# Patient Record
Sex: Male | Born: 1955
Health system: Southern US, Community
[De-identification: ages and names within clinical notes are randomized; demographics above are authoritative.]

## PROBLEM LIST (undated history)

## (undated) DIAGNOSIS — I351 Nonrheumatic aortic (valve) insufficiency: Secondary | ICD-10-CM

## (undated) DIAGNOSIS — Z87442 Personal history of urinary calculi: Secondary | ICD-10-CM

## (undated) DIAGNOSIS — E785 Hyperlipidemia, unspecified: Secondary | ICD-10-CM

## (undated) DIAGNOSIS — I2510 Atherosclerotic heart disease of native coronary artery without angina pectoris: Secondary | ICD-10-CM

## (undated) DIAGNOSIS — I10 Essential (primary) hypertension: Secondary | ICD-10-CM

## (undated) DIAGNOSIS — R9389 Abnormal findings on diagnostic imaging of other specified body structures: Secondary | ICD-10-CM

## (undated) DIAGNOSIS — IMO0001 Reserved for inherently not codable concepts without codable children: Secondary | ICD-10-CM

## (undated) DIAGNOSIS — R002 Palpitations: Secondary | ICD-10-CM

## (undated) DIAGNOSIS — I5022 Chronic systolic (congestive) heart failure: Secondary | ICD-10-CM

## (undated) DIAGNOSIS — C8590 Non-Hodgkin lymphoma, unspecified, unspecified site: Secondary | ICD-10-CM

## (undated) DIAGNOSIS — J45909 Unspecified asthma, uncomplicated: Secondary | ICD-10-CM

## (undated) DIAGNOSIS — J189 Pneumonia, unspecified organism: Secondary | ICD-10-CM

## (undated) HISTORY — DX: Unspecified asthma, uncomplicated: J45.909

---

## 1998-11-17 ENCOUNTER — Emergency Department (HOSPITAL_COMMUNITY): Admission: EM | Admit: 1998-11-17 | Discharge: 1998-11-17 | Payer: Self-pay | Admitting: Emergency Medicine

## 1998-11-17 ENCOUNTER — Encounter: Payer: Self-pay | Admitting: Emergency Medicine

## 1998-11-18 ENCOUNTER — Encounter: Admission: RE | Admit: 1998-11-18 | Discharge: 1998-11-18 | Payer: Self-pay | Admitting: *Deleted

## 2001-06-14 DIAGNOSIS — C8590 Non-Hodgkin lymphoma, unspecified, unspecified site: Secondary | ICD-10-CM

## 2001-06-14 DIAGNOSIS — J189 Pneumonia, unspecified organism: Secondary | ICD-10-CM

## 2001-06-14 HISTORY — DX: Pneumonia, unspecified organism: J18.9

## 2001-06-14 HISTORY — DX: Non-Hodgkin lymphoma, unspecified, unspecified site: C85.90

## 2002-02-01 ENCOUNTER — Ambulatory Visit (HOSPITAL_COMMUNITY): Admission: RE | Admit: 2002-02-01 | Discharge: 2002-02-01 | Payer: Self-pay | Admitting: Oncology

## 2002-02-01 ENCOUNTER — Encounter (HOSPITAL_COMMUNITY): Payer: Self-pay | Admitting: Oncology

## 2002-02-06 ENCOUNTER — Ambulatory Visit (HOSPITAL_COMMUNITY): Admission: RE | Admit: 2002-02-06 | Discharge: 2002-02-06 | Payer: Self-pay | Admitting: Oncology

## 2002-05-09 ENCOUNTER — Encounter (HOSPITAL_COMMUNITY): Payer: Self-pay | Admitting: Oncology

## 2002-05-09 ENCOUNTER — Ambulatory Visit (HOSPITAL_COMMUNITY): Admission: RE | Admit: 2002-05-09 | Discharge: 2002-05-09 | Payer: Self-pay | Admitting: Oncology

## 2002-05-15 ENCOUNTER — Inpatient Hospital Stay (HOSPITAL_COMMUNITY): Admission: EM | Admit: 2002-05-15 | Discharge: 2002-05-26 | Payer: Self-pay | Admitting: Emergency Medicine

## 2002-05-15 ENCOUNTER — Encounter: Payer: Self-pay | Admitting: Emergency Medicine

## 2002-05-16 ENCOUNTER — Encounter: Payer: Self-pay | Admitting: Cardiovascular Disease

## 2002-05-19 ENCOUNTER — Encounter: Payer: Self-pay | Admitting: Hematology and Oncology

## 2002-05-22 ENCOUNTER — Encounter: Payer: Self-pay | Admitting: Cardiovascular Disease

## 2002-05-25 ENCOUNTER — Encounter: Payer: Self-pay | Admitting: Oncology

## 2002-05-30 ENCOUNTER — Encounter (HOSPITAL_COMMUNITY): Payer: Self-pay | Admitting: Oncology

## 2002-05-30 ENCOUNTER — Ambulatory Visit (HOSPITAL_COMMUNITY): Admission: RE | Admit: 2002-05-30 | Discharge: 2002-05-30 | Payer: Self-pay | Admitting: Oncology

## 2002-06-01 ENCOUNTER — Ambulatory Visit: Admission: RE | Admit: 2002-06-01 | Discharge: 2002-08-08 | Payer: Self-pay | Admitting: Radiation Oncology

## 2002-11-01 ENCOUNTER — Ambulatory Visit (HOSPITAL_COMMUNITY): Admission: RE | Admit: 2002-11-01 | Discharge: 2002-11-01 | Payer: Self-pay | Admitting: Oncology

## 2002-11-01 ENCOUNTER — Encounter (HOSPITAL_COMMUNITY): Payer: Self-pay | Admitting: Oncology

## 2003-10-10 ENCOUNTER — Ambulatory Visit (HOSPITAL_COMMUNITY): Admission: RE | Admit: 2003-10-10 | Discharge: 2003-10-10 | Payer: Self-pay | Admitting: Oncology

## 2004-07-27 ENCOUNTER — Ambulatory Visit: Payer: Self-pay | Admitting: Oncology

## 2004-11-16 ENCOUNTER — Ambulatory Visit: Payer: Self-pay | Admitting: Oncology

## 2004-11-17 ENCOUNTER — Ambulatory Visit (HOSPITAL_COMMUNITY): Admission: RE | Admit: 2004-11-17 | Discharge: 2004-11-17 | Payer: Self-pay | Admitting: Oncology

## 2005-05-21 ENCOUNTER — Ambulatory Visit: Payer: Self-pay | Admitting: Oncology

## 2005-08-25 ENCOUNTER — Ambulatory Visit: Payer: Self-pay | Admitting: Cardiology

## 2005-08-26 ENCOUNTER — Ambulatory Visit: Payer: Self-pay | Admitting: Internal Medicine

## 2005-09-28 ENCOUNTER — Ambulatory Visit: Payer: Self-pay | Admitting: Cardiology

## 2005-10-07 ENCOUNTER — Ambulatory Visit: Payer: Self-pay | Admitting: Internal Medicine

## 2005-10-18 ENCOUNTER — Ambulatory Visit: Payer: Self-pay

## 2005-11-18 ENCOUNTER — Ambulatory Visit: Payer: Self-pay | Admitting: Oncology

## 2005-11-22 ENCOUNTER — Ambulatory Visit (HOSPITAL_COMMUNITY): Admission: RE | Admit: 2005-11-22 | Discharge: 2005-11-22 | Payer: Self-pay | Admitting: Oncology

## 2005-11-22 LAB — CBC WITH DIFFERENTIAL/PLATELET
Basophils Absolute: 0.1 10*3/uL (ref 0.0–0.1)
EOS%: 3 % (ref 0.0–7.0)
Eosinophils Absolute: 0.4 10*3/uL (ref 0.0–0.5)
LYMPH%: 15.6 % (ref 14.0–48.0)
MCH: 31.3 pg (ref 28.0–33.4)
MCV: 89.2 fL (ref 81.6–98.0)
MONO%: 9.3 % (ref 0.0–13.0)
NEUT#: 9.9 10*3/uL — ABNORMAL HIGH (ref 1.5–6.5)
Platelets: 323 10*3/uL (ref 145–400)
RBC: 4.85 10*6/uL (ref 4.20–5.71)
RDW: 13.3 % (ref 11.2–14.6)

## 2005-11-22 LAB — COMPREHENSIVE METABOLIC PANEL
AST: 17 U/L (ref 0–37)
Alkaline Phosphatase: 73 U/L (ref 39–117)
BUN: 23 mg/dL (ref 6–23)
Glucose, Bld: 101 mg/dL — ABNORMAL HIGH (ref 70–99)
Total Bilirubin: 0.5 mg/dL (ref 0.3–1.2)

## 2006-05-19 ENCOUNTER — Ambulatory Visit: Payer: Self-pay | Admitting: Oncology

## 2012-08-11 ENCOUNTER — Ambulatory Visit (INDEPENDENT_AMBULATORY_CARE_PROVIDER_SITE_OTHER): Payer: BC Managed Care – PPO | Admitting: Physician Assistant

## 2012-08-11 VITALS — BP 150/70 | HR 88 | Temp 97.8°F | Resp 20 | Ht 66.0 in | Wt 247.0 lb

## 2012-08-11 DIAGNOSIS — R059 Cough, unspecified: Secondary | ICD-10-CM

## 2012-08-11 DIAGNOSIS — R0981 Nasal congestion: Secondary | ICD-10-CM

## 2012-08-11 DIAGNOSIS — R05 Cough: Secondary | ICD-10-CM

## 2012-08-11 DIAGNOSIS — J3489 Other specified disorders of nose and nasal sinuses: Secondary | ICD-10-CM

## 2012-08-11 MED ORDER — IPRATROPIUM BROMIDE 0.03 % NA SOLN: 2.0000 | Freq: Two times a day (BID) | NASAL | Status: DC

## 2012-08-11 MED ORDER — AZITHROMYCIN 250 MG PO TABS: ORAL_TABLET | ORAL | Status: DC

## 2012-08-11 NOTE — Progress Notes (Signed)
  Subjective:    Patient ID: Donald Escobar., male    DOB: [birth year], 57 y.o.   MRN: [id number]  HPI 57 year old male presents with 3 day history of nasal congestion, sore throat, dry, hacking cough, and sinus pressure.  Does admit to chills and subjective fever - no documented fever.  Denies nausea, vomiting, headache, dizziness, hemoptysis, or otalgia.  He has been taking ibuprofen with sudafed which has helped slightly but also can have contributed to his elevated BP today. Does have history of hypertension treated with norvasc and HTCZ.  Treated by a NP at his work and has been well controlled recently.      Review of Systems  Constitutional: Positive for chills. Negative for fever.  HENT: Positive for congestion, sore throat, rhinorrhea and postnasal drip. Negative for ear pain and neck pain.   Respiratory: Positive for cough. Negative for chest tightness, shortness of breath and wheezing.   Gastrointestinal: Negative for nausea, vomiting and abdominal pain.  Neurological: Negative for dizziness, light-headedness and headaches.  All other systems reviewed and are negative.       Objective:   Physical Exam  Constitutional: He is oriented to person, place, and time. He appears well-developed and well-nourished.  HENT:  Head: Normocephalic and atraumatic.  Right Ear: Hearing, tympanic membrane, external ear and ear canal normal.  Left Ear: Hearing, tympanic membrane, external ear and ear canal normal.  Mouth/Throat: Uvula is midline, oropharynx is clear and moist and mucous membranes are normal. No oropharyngeal exudate.  Eyes: Conjunctivae are normal.  Neck: Normal range of motion.  Cardiovascular: Normal rate, regular rhythm and normal heart sounds.   Pulmonary/Chest: Effort normal and breath sounds normal.  Neurological: He is alert and oriented to person, place, and time.  Psychiatric: He has a normal mood and affect. His behavior is normal. Judgment and thought content normal.           Assessment & Plan:  1. Cough - Plan: azithromycin (ZITHROMAX) 250 MG tablet  -Will go ahead and treat with Zpack - pt requested  -D/C sudafed  -Mucinex as directed 2. Nasal congestion - Plan: ipratropium (ATROVENT) 0.03 % nasal spray  -Atrovent NS bid to help with congestion  -Follow up if symptoms worsen or fail to improve.

## 2013-06-15 ENCOUNTER — Ambulatory Visit (INDEPENDENT_AMBULATORY_CARE_PROVIDER_SITE_OTHER): Payer: BC Managed Care – PPO | Admitting: Internal Medicine

## 2013-06-15 VITALS — BP 140/72 | HR 76 | Temp 99.2°F | Resp 18 | Ht 66.0 in | Wt 245.0 lb

## 2013-06-15 DIAGNOSIS — R059 Cough, unspecified: Secondary | ICD-10-CM

## 2013-06-15 DIAGNOSIS — J019 Acute sinusitis, unspecified: Secondary | ICD-10-CM

## 2013-06-15 DIAGNOSIS — R05 Cough: Secondary | ICD-10-CM

## 2013-06-15 MED ORDER — AZITHROMYCIN 500 MG PO TABS: 500.0000 mg | ORAL_TABLET | Freq: Every day | ORAL | Status: DC

## 2013-06-15 MED ORDER — HYDROCODONE-ACETAMINOPHEN 7.5-325 MG/15ML PO SOLN: 10.0000 mL | Freq: Four times a day (QID) | ORAL | Status: DC | PRN

## 2013-06-15 NOTE — Patient Instructions (Signed)
Insomnia Insomnia is frequent trouble falling and/or staying asleep. Insomnia can be a long term problem or a short term problem. Both are common. Insomnia can be a short term problem when the wakefulness is related to a certain stress or worry. Long term insomnia is often related to ongoing stress during waking hours and/or poor sleeping habits. Overtime, sleep deprivation itself can make the problem worse. Every little thing feels more severe because you are overtired and your ability to cope is decreased. CAUSES   Stress, anxiety, and depression.  Poor sleeping habits.  Distractions such as TV in the bedroom.  Naps close to bedtime.  Engaging in emotionally charged conversations before bed.  Technical reading before sleep.  Alcohol and other sedatives. They may make the problem worse. They can hurt normal sleep patterns and normal dream activity.  Stimulants such as caffeine for several hours prior to bedtime.  Pain syndromes and shortness of breath can cause insomnia.  Exercise late at night.  Changing time zones may cause sleeping problems (jet lag). It is sometimes helpful to have someone observe your sleeping patterns. They should look for periods of not breathing during the night (sleep apnea). They should also look to see how long those periods last. If you live alone or observers are uncertain, you can also be observed at a sleep clinic where your sleep patterns will be professionally monitored. Sleep apnea requires a checkup and treatment. Give your caregivers your medical history. Give your caregivers observations your family has made about your sleep.  SYMPTOMS   Not feeling rested in the morning.  Anxiety and restlessness at bedtime.  Difficulty falling and staying asleep. TREATMENT   Your caregiver may prescribe treatment for an underlying medical disorders. Your caregiver can give advice or help if you are using alcohol or other drugs for self-medication. Treatment  of underlying problems will usually eliminate insomnia problems.  Medications can be prescribed for short time use. They are generally not recommended for lengthy use.  Over-the-counter sleep medicines are not recommended for lengthy use. They can be habit forming.  You can promote easier sleeping by making lifestyle changes such as:  Using relaxation techniques that help with breathing and reduce muscle tension.  Exercising earlier in the day.  Changing your diet and the time of your last meal. No night time snacks.  Establish a regular time to go to bed.  Counseling can help with stressful problems and worry.  Soothing music and white noise may be helpful if there are background noises you cannot remove.  Stop tedious detailed work at least one hour before bedtime. HOME CARE INSTRUCTIONS   Keep a diary. Inform your caregiver about your progress. This includes any medication side effects. See your caregiver regularly. Take note of:  Times when you are asleep.  Times when you are awake during the night.  The quality of your sleep.  How you feel the next day. This information will help your caregiver care for you.  Get out of bed if you are still awake after 15 minutes. Read or do some quiet activity. Keep the lights down. Wait until you feel sleepy and go back to bed.  Keep regular sleeping and waking hours. Avoid naps.  Exercise regularly.  Avoid distractions at bedtime. Distractions include watching television or engaging in any intense or detailed activity like attempting to balance the household checkbook.  Develop a bedtime ritual. Keep a familiar routine of bathing, brushing your teeth, climbing into bed at the same   time each night, listening to soothing music. Routines increase the success of falling to sleep faster.  Use relaxation techniques. This can be using breathing and muscle tension release routines. It can also include visualizing peaceful scenes. You can  also help control troubling or intruding thoughts by keeping your mind occupied with boring or repetitive thoughts like the old concept of counting sheep. You can make it more creative like imagining planting one beautiful flower after another in your backyard garden.  During your day, work to eliminate stress. When this is not possible use some of the previous suggestions to help reduce the anxiety that accompanies stressful situations. MAKE SURE YOU:   Understand these instructions.  Will watch your condition.  Will get help right away if you are not doing well or get worse. Document Released: 05/28/2000 Document Revised: 08/23/2011 Document Reviewed: 06/28/2007 [facility] Patient Information 2014 [place]. Sinusitis Sinusitis is redness, soreness, and swelling (inflammation) of the paranasal sinuses. Paranasal sinuses are air pockets within the bones of your face (beneath the eyes, the middle of the forehead, or above the eyes). In healthy paranasal sinuses, mucus is able to drain out, and air is able to circulate through them by way of your nose. However, when your paranasal sinuses are inflamed, mucus and air can become trapped. This can allow bacteria and other germs to grow and cause infection. Sinusitis can develop quickly and last only a short time (acute) or continue over a long period (chronic). Sinusitis that lasts for more than 12 weeks is considered chronic.  CAUSES  Causes of sinusitis include:  Allergies.  Structural abnormalities, such as displacement of the cartilage that separates your nostrils (deviated septum), which can decrease the air flow through your nose and sinuses and affect sinus drainage.  Functional abnormalities, such as when the small hairs (cilia) that line your sinuses and help remove mucus do not work properly or are not present. SYMPTOMS  Symptoms of acute and chronic sinusitis are the same. The primary symptoms are pain and pressure around the  affected sinuses. Other symptoms include:  Upper toothache.  Earache.  Headache.  Bad breath.  Decreased sense of smell and taste.  A cough, which worsens when you are lying flat.  Fatigue.  Fever.  Thick drainage from your nose, which often is green and may contain pus (purulent).  Swelling and warmth over the affected sinuses. DIAGNOSIS  Your caregiver will perform a physical exam. During the exam, your caregiver may:  Look in your nose for signs of abnormal growths in your nostrils (nasal polyps).  Tap over the affected sinus to check for signs of infection.  View the inside of your sinuses (endoscopy) with a special imaging device with a light attached (endoscope), which is inserted into your sinuses. If your caregiver suspects that you have chronic sinusitis, one or more of the following tests may be recommended:  Allergy tests.  Nasal culture A sample of mucus is taken from your nose and sent to a lab and screened for bacteria.  Nasal cytology A sample of mucus is taken from your nose and examined by your caregiver to determine if your sinusitis is related to an allergy. TREATMENT  Most cases of acute sinusitis are related to a viral infection and will resolve on their own within 10 days. Sometimes medicines are prescribed to help relieve symptoms (pain medicine, decongestants, nasal steroid sprays, or saline sprays).  However, for sinusitis related to a bacterial infection, your caregiver will prescribe antibiotic medicines.  These are medicines that will help kill the bacteria causing the infection.  Rarely, sinusitis is caused by a fungal infection. In theses cases, your caregiver will prescribe antifungal medicine. For some cases of chronic sinusitis, surgery is needed. Generally, these are cases in which sinusitis recurs more than 3 times per year, despite other treatments. HOME CARE INSTRUCTIONS   Drink plenty of water. Water helps thin the mucus so your sinuses  can drain more easily.  Use a humidifier.  Inhale steam 3 to 4 times a day (for example, sit in the bathroom with the shower running).  Apply a warm, moist washcloth to your face 3 to 4 times a day, or as directed by your caregiver.  Use saline nasal sprays to help moisten and clean your sinuses.  Take over-the-counter or prescription medicines for pain, discomfort, or fever only as directed by your caregiver. SEEK IMMEDIATE MEDICAL CARE IF:  You have increasing pain or severe headaches.  You have nausea, vomiting, or drowsiness.  You have swelling around your face.  You have vision problems.  You have a stiff neck.  You have difficulty breathing. MAKE SURE YOU:   Understand these instructions.  Will watch your condition.  Will get help right away if you are not doing well or get worse. Document Released: 05/31/2005 Document Revised: 08/23/2011 Document Reviewed: 06/15/2011 [facility] Patient Information 2014 [place], [place].

## 2013-06-15 NOTE — Progress Notes (Signed)
   Subjective:    Patient ID: Donald Ganja., male    DOB: [birth year], 58 y.o.   MRN: [id number]  HPI Congestio, HA, milky copious sinus discharge.Now over 10 days and coughing from PND. Low grade fever, rarely gets sick.   Review of Systems Heart murmur and palpatations.    Objective:   Physical Exam  Constitutional: He is oriented to person, place, and time. He appears well-developed and well-nourished.  HENT:  Head: Normocephalic.  Right Ear: External ear normal.  Left Ear: External ear normal.  Nose: Mucosal edema, rhinorrhea and sinus tenderness present. Right sinus exhibits no maxillary sinus tenderness and no frontal sinus tenderness. Left sinus exhibits no maxillary sinus tenderness and no frontal sinus tenderness.  Mouth/Throat: Oropharynx is clear and moist.  Cardiovascular: Normal rate, regular rhythm and normal heart sounds.   Pulmonary/Chest: Effort normal and breath sounds normal. He has no wheezes. He exhibits no tenderness.  Neurological: He is alert and oriented to person, place, and time. He exhibits normal muscle tone. Coordination normal.  Psychiatric: He has a normal mood and affect. His behavior is normal.          Assessment & Plan:  Evaluate heart murmur soon Zithromax 500mg Alvina Filbert

## 2013-06-21 ENCOUNTER — Ambulatory Visit: Payer: BC Managed Care – PPO

## 2013-06-21 ENCOUNTER — Ambulatory Visit (INDEPENDENT_AMBULATORY_CARE_PROVIDER_SITE_OTHER): Payer: BC Managed Care – PPO | Admitting: Family Medicine

## 2013-06-21 VITALS — BP 138/64 | HR 86 | Temp 97.8°F | Resp 16 | Ht 66.0 in | Wt 241.0 lb

## 2013-06-21 DIAGNOSIS — J3489 Other specified disorders of nose and nasal sinuses: Secondary | ICD-10-CM

## 2013-06-21 DIAGNOSIS — J320 Chronic maxillary sinusitis: Secondary | ICD-10-CM

## 2013-06-21 MED ORDER — FLUTICASONE PROPIONATE 50 MCG/ACT NA SUSP: NASAL | Status: DC

## 2013-06-21 MED ORDER — CEFDINIR 300 MG PO CAPS
600.0000 mg | ORAL_CAPSULE | Freq: Every day | ORAL | Status: DC
Start: 2013-06-21 — End: 2014-07-11

## 2013-06-21 MED ORDER — PREDNISONE 20 MG PO TABS: ORAL_TABLET | ORAL | Status: DC

## 2013-06-21 NOTE — Progress Notes (Signed)
Subjective: 58 year old man who was here last week with upper respiratory and sections symptoms and diagnosed with a possible sinusitis by Dr. Elder Cyphers. He was treated with azithromycin. He is not improved. He works in [employer] so he gets a moderate amount of dust from the fibers. He does not smoke. He has been feeling stuffy in his ears. He is blowing a lot of purulent stuff out of his nose. He does not complain much of sore throat. He coughs some, primarily when he shifts position such as laying down. He's not been feverish.  Objective: Pleasant alert gentleman in no major distress. He is congested. His TMs are normal. Nose congested. Throat clear except for little postnasal drainage. Neck supple without significant nodes. Chest clear to auscultation. Heart regular without murmurs.  Assessment: Persistent upper respiratory infection  Plan: Sinus x-rays  .[facility] reading (PRIMARY) by  Dr. Linna Darner Left maxillary sinusitis with air-fluid level  Steroids, Long course of antibiotics.

## 2013-06-21 NOTE — Patient Instructions (Addendum)
Drink lots of fluids  Take Omnicef one twice daily for 3 weeks.[name] it is important to take one long course of medication to try and prevent persistence of low-grade infection. If symptoms should continue to persist, we would need to refer to an ENT Dr.  Joellyn Rued Prednisone 3 daily for 2 days, 2 daily for 2 days, then 1 daily for 2 days.  Take with breakfast  Use fluticasone 2 sprays each nostril twice daily for 4 days, then once daily.

## 2014-07-11 ENCOUNTER — Emergency Department (HOSPITAL_COMMUNITY): Payer: BLUE CROSS/BLUE SHIELD

## 2014-07-11 ENCOUNTER — Encounter (HOSPITAL_COMMUNITY): Payer: Self-pay

## 2014-07-11 ENCOUNTER — Ambulatory Visit (INDEPENDENT_AMBULATORY_CARE_PROVIDER_SITE_OTHER): Payer: BLUE CROSS/BLUE SHIELD

## 2014-07-11 ENCOUNTER — Ambulatory Visit (INDEPENDENT_AMBULATORY_CARE_PROVIDER_SITE_OTHER): Payer: BLUE CROSS/BLUE SHIELD | Admitting: Family Medicine

## 2014-07-11 ENCOUNTER — Inpatient Hospital Stay (HOSPITAL_COMMUNITY)
Admission: EM | Admit: 2014-07-11 | Discharge: 2014-07-16 | DRG: 287 | Disposition: A | Discharge status: Home / Self Care | Payer: BLUE CROSS/BLUE SHIELD | Attending: Cardiology | Admitting: Cardiology

## 2014-07-11 VITALS — BP 150/76 | HR 102 | Temp 97.7°F | Resp 20 | Ht 65.5 in | Wt 252.4 lb

## 2014-07-11 DIAGNOSIS — Z8572 Personal history of non-Hodgkin lymphomas: Secondary | ICD-10-CM

## 2014-07-11 DIAGNOSIS — J45909 Unspecified asthma, uncomplicated: Secondary | ICD-10-CM | POA: Diagnosis present

## 2014-07-11 DIAGNOSIS — I2510 Atherosclerotic heart disease of native coronary artery without angina pectoris: Secondary | ICD-10-CM | POA: Diagnosis present

## 2014-07-11 DIAGNOSIS — I712 Thoracic aortic aneurysm, without rupture, unspecified: Secondary | ICD-10-CM

## 2014-07-11 DIAGNOSIS — R002 Palpitations: Secondary | ICD-10-CM | POA: Diagnosis present

## 2014-07-11 DIAGNOSIS — R7989 Other specified abnormal findings of blood chemistry: Secondary | ICD-10-CM | POA: Diagnosis not present

## 2014-07-11 DIAGNOSIS — R0602 Shortness of breath: Secondary | ICD-10-CM

## 2014-07-11 DIAGNOSIS — I509 Heart failure, unspecified: Secondary | ICD-10-CM | POA: Diagnosis not present

## 2014-07-11 DIAGNOSIS — R911 Solitary pulmonary nodule: Secondary | ICD-10-CM | POA: Diagnosis present

## 2014-07-11 DIAGNOSIS — Z23 Encounter for immunization: Secondary | ICD-10-CM | POA: Diagnosis not present

## 2014-07-11 DIAGNOSIS — N200 Calculus of kidney: Secondary | ICD-10-CM | POA: Diagnosis present

## 2014-07-11 DIAGNOSIS — I351 Nonrheumatic aortic (valve) insufficiency: Secondary | ICD-10-CM | POA: Diagnosis present

## 2014-07-11 DIAGNOSIS — R9431 Abnormal electrocardiogram [ECG] [EKG]: Secondary | ICD-10-CM

## 2014-07-11 DIAGNOSIS — I5021 Acute systolic (congestive) heart failure: Secondary | ICD-10-CM | POA: Diagnosis present

## 2014-07-11 DIAGNOSIS — Z6841 Body Mass Index (BMI) 40.0 and over, adult: Secondary | ICD-10-CM

## 2014-07-11 DIAGNOSIS — D3502 Benign neoplasm of left adrenal gland: Secondary | ICD-10-CM

## 2014-07-11 DIAGNOSIS — I214 Non-ST elevation (NSTEMI) myocardial infarction: Secondary | ICD-10-CM | POA: Diagnosis present

## 2014-07-11 DIAGNOSIS — D3500 Benign neoplasm of unspecified adrenal gland: Secondary | ICD-10-CM | POA: Diagnosis present

## 2014-07-11 DIAGNOSIS — R9389 Abnormal findings on diagnostic imaging of other specified body structures: Secondary | ICD-10-CM | POA: Diagnosis present

## 2014-07-11 DIAGNOSIS — E785 Hyperlipidemia, unspecified: Secondary | ICD-10-CM | POA: Diagnosis present

## 2014-07-11 DIAGNOSIS — Z9221 Personal history of antineoplastic chemotherapy: Secondary | ICD-10-CM | POA: Diagnosis not present

## 2014-07-11 DIAGNOSIS — I5020 Unspecified systolic (congestive) heart failure: Secondary | ICD-10-CM

## 2014-07-11 DIAGNOSIS — I10 Essential (primary) hypertension: Secondary | ICD-10-CM | POA: Diagnosis present

## 2014-07-11 DIAGNOSIS — K7689 Other specified diseases of liver: Secondary | ICD-10-CM | POA: Diagnosis present

## 2014-07-11 DIAGNOSIS — IMO0001 Reserved for inherently not codable concepts without codable children: Secondary | ICD-10-CM

## 2014-07-11 HISTORY — DX: Essential (primary) hypertension: I10

## 2014-07-11 HISTORY — DX: Nonrheumatic aortic (valve) insufficiency: I35.1

## 2014-07-11 HISTORY — DX: Chronic systolic (congestive) heart failure: I50.22

## 2014-07-11 HISTORY — DX: Palpitations: R00.2

## 2014-07-11 HISTORY — DX: Abnormal findings on diagnostic imaging of other specified body structures: R93.89

## 2014-07-11 HISTORY — DX: Non-Hodgkin lymphoma, unspecified, unspecified site: C85.90

## 2014-07-11 HISTORY — DX: Hyperlipidemia, unspecified: E78.5

## 2014-07-11 HISTORY — DX: Morbid (severe) obesity due to excess calories: E66.01

## 2014-07-11 HISTORY — DX: Atherosclerotic heart disease of native coronary artery without angina pectoris: I25.10

## 2014-07-11 LAB — CBC
HCT: 43.8 % (ref 39.0–52.0)
Hemoglobin: 14.9 g/dL (ref 13.0–17.0)
MCH: 30.7 pg (ref 26.0–34.0)
MCHC: 34 g/dL (ref 30.0–36.0)
MCV: 90.3 fL (ref 78.0–100.0)
PLATELETS: 230 10*3/uL (ref 150–400)
RBC: 4.85 MIL/uL (ref 4.22–5.81)
RDW: 13.1 % (ref 11.5–15.5)
WBC: 9.3 10*3/uL (ref 4.0–10.5)

## 2014-07-11 LAB — TROPONIN I: Troponin I: 0.18 ng/mL — ABNORMAL HIGH (ref <0.031)

## 2014-07-11 LAB — BASIC METABOLIC PANEL
ANION GAP: 8 (ref 5–15)
BUN: 27 mg/dL — AB (ref 6–23)
CHLORIDE: 106 mmol/L (ref 96–112)
CO2: 25 mmol/L (ref 19–32)
Calcium: 9.1 mg/dL (ref 8.4–10.5)
Creatinine, Ser: 1.35 mg/dL (ref 0.50–1.35)
GFR calc Af Amer: 64 mL/min — ABNORMAL LOW (ref >90)
GFR, EST NON AFRICAN AMERICAN: 56 mL/min — AB (ref >90)
Glucose, Bld: 102 mg/dL — ABNORMAL HIGH (ref 70–99)
POTASSIUM: 4.8 mmol/L (ref 3.5–5.1)
Sodium: 139 mmol/L (ref 135–145)

## 2014-07-11 LAB — I-STAT TROPONIN, ED: TROPONIN I, POC: 0.13 ng/mL — AB (ref 0.00–0.08)

## 2014-07-11 LAB — BRAIN NATRIURETIC PEPTIDE: B NATRIURETIC PEPTIDE 5: 519.1 pg/mL — AB (ref 0.0–100.0)

## 2014-07-11 LAB — D-DIMER, QUANTITATIVE (NOT AT ARMC): D-Dimer, Quant: 1.44 ug/mL-FEU — ABNORMAL HIGH (ref 0.00–0.48)

## 2014-07-11 MED ORDER — CARVEDILOL 3.125 MG PO TABS
3.1250 mg | ORAL_TABLET | Freq: Two times a day (BID) | ORAL | Status: DC
  Administered 2014-07-12 – 2014-07-16 (×9): 3.125 mg via ORAL
  Filled 2014-07-11 (×12): qty 1

## 2014-07-11 MED ORDER — IOHEXOL 300 MG/ML  SOLN: 100.0000 mL | Freq: Once | INTRAMUSCULAR | Status: DC | PRN

## 2014-07-11 MED ORDER — ASPIRIN EC 81 MG PO TBEC
81.0000 mg | DELAYED_RELEASE_TABLET | Freq: Every day | ORAL | Status: DC
  Administered 2014-07-12 – 2014-07-16 (×5): 81 mg via ORAL
  Filled 2014-07-11 (×5): qty 1

## 2014-07-11 MED ORDER — SODIUM CHLORIDE 0.9 % IJ SOLN: 3.0000 mL | INTRAMUSCULAR | Status: DC | PRN

## 2014-07-11 MED ORDER — ONDANSETRON HCL 4 MG/2ML IJ SOLN: 4.0000 mg | Freq: Four times a day (QID) | INTRAMUSCULAR | Status: DC | PRN

## 2014-07-11 MED ORDER — FUROSEMIDE 10 MG/ML IJ SOLN
40.0000 mg | Freq: Once | INTRAMUSCULAR | Status: AC
  Administered 2014-07-12: 40 mg via INTRAVENOUS
  Filled 2014-07-11: qty 4

## 2014-07-11 MED ORDER — IOHEXOL 350 MG/ML SOLN
100.0000 mL | Freq: Once | INTRAVENOUS | Status: AC | PRN
  Administered 2014-07-11: 100 mL via INTRAVENOUS

## 2014-07-11 MED ORDER — ASPIRIN 81 MG PO CHEW
243.0000 mg | CHEWABLE_TABLET | Freq: Once | ORAL | Status: DC
Start: 2014-07-11 — End: 2014-07-16

## 2014-07-11 MED ORDER — ACETAMINOPHEN 325 MG PO TABS: 650.0000 mg | ORAL_TABLET | ORAL | Status: DC | PRN

## 2014-07-11 MED ORDER — ASPIRIN 81 MG PO CHEW: 324.0000 mg | CHEWABLE_TABLET | Freq: Once | ORAL | Status: DC

## 2014-07-11 MED ORDER — SODIUM CHLORIDE 0.9 % IJ SOLN
3.0000 mL | Freq: Two times a day (BID) | INTRAMUSCULAR | Status: DC
  Administered 2014-07-12 – 2014-07-16 (×8): 3 mL via INTRAVENOUS

## 2014-07-11 MED ORDER — SODIUM CHLORIDE 0.9 % IV SOLN: 250.0000 mL | INTRAVENOUS | Status: DC | PRN

## 2014-07-11 MED ORDER — ENOXAPARIN SODIUM 120 MG/0.8ML ~~LOC~~ SOLN
1.0000 mg/kg | Freq: Once | SUBCUTANEOUS | Status: AC
  Administered 2014-07-11: 115 mg via SUBCUTANEOUS
  Filled 2014-07-11: qty 0.8

## 2014-07-11 NOTE — ED Notes (Signed)
NOTIFIED DR. GOLDSTON IN PERSON FOR PATIENTS LAB RESULTS OF I-STAT TROPONIN @18 :21PM ,07/11/2014.

## 2014-07-11 NOTE — H&P (Addendum)
HPI: [name] is a 59 yo man with history of asthma (no PFTs per pt), non-hodgkins lymphoma and HTN who presents with palpitations.  He saw the NP at work who recommended he get further evaluation based on an abnormal ECG.  He reports that over the last couple of weeks he has felt his heart beating irregularly, can be fast, occuring daily.  Episodes last up to 20 minutes but vary.  However, he states that he doesn't feel like his heart beat is ever normal during this time frame.  He has also had worsening DOE.  Currently wouldn't be able to climb a flight of stairs.  He is able to walk on flat ground but has to pace himself due to DOE.    He works in International aid/development worker.  He reports having a normal childhood, but in his teenage years had difficulty keeping up with others due to DOE thought to be asthma.  He currently denies any CP, orthopnea, PND, edema.  He denies snoring but sleeps alone.  He currently only takes ASA and albuterol which he obtained recently.    Review of Systems:     Cardiac Review of Systems: {Y] = yes [ ]  = no  Chest Pain [    ]  Resting SOB [   ] Exertional SOB  [ y ]  Orthopnea [  ]   Pedal Edema [   ]    Palpitations [ y ] Syncope  [  ]   Presyncope [   ]  General Review of Systems: [Y] = yes [  ]=no Constitional: recent weight change [  ]; anorexia [  ]; fatigue [  ]; nausea [  ]; night sweats [  ]; fever [  ]; or chills [  ];                                                                     Dental: poor dentition[  ];   Eye : blurred vision [  ]; diplopia [   ]; vision changes [  ];  Amaurosis fugax[  ]; Resp: cough [  ];  wheezing[ y ];  hemoptysis[  ]; shortness of breath[  ]; paroxysmal nocturnal dyspnea[  ]; dyspnea on exertion[ y ]; or orthopnea[  ];  GI:  gallstones[  ], vomiting[  ];  dysphagia[  ]; melena[  ];  hematochezia [  ]; heartburn[  ];   GU: kidney stones [  ]; hematuria[  ];   dysuria [  ];  nocturia[  ];               Skin: rash [  ],  swelling[  ];, hair loss[  ];  peripheral edema[  ];  or itching[  ]; Musculosketetal: myalgias[  ];  joint swelling[  ];  joint erythema[  ];  joint pain[  ];  back pain[  ];  Heme/Lymph: bruising[  ];  bleeding[  ];  anemia[  ];  Neuro: TIA[  ];  headaches[  ];  stroke[  ];  vertigo[  ];  seizures[  ];   paresthesias[  ];  difficulty walking[  ];  Psych:depression[  ]; anxiety[  ];  Endocrine: diabetes[  ];  thyroid dysfunction[  ];  Other:  Past Medical History  Diagnosis Date  . Asthma   . Cancer 2003    non-hodgkins lymphoma  . Hypertension   . Acute CHF 07/11/2014    Current Facility-Administered Medications on File Prior to Encounter  Medication Dose Route Frequency Provider Last Rate Last Dose  . aspirin chewable tablet 243 mg  243 mg Oral Once Darreld Mclean, MD       Current Outpatient Prescriptions on File Prior to Encounter  Medication Sig Dispense Refill  . albuterol (PROVENTIL HFA;VENTOLIN HFA) 108 (90 BASE) MCG/ACT inhaler Inhale into the lungs every 6 (six) hours as needed for wheezing or shortness of breath.      Allergies  Allergen Reactions  . Sulfa Antibiotics Swelling    Lip swelling    History   Social History  . Marital Status: Single    Spouse Name: N/A    Number of Children: N/A  . Years of Education: N/A   Occupational History  . Not on file.   Social History Main Topics  . Smoking status: Never Smoker   . Smokeless tobacco: Not on file  . Alcohol Use: No  . Drug Use: No  . Sexual Activity: No   Other Topics Concern  . Not on file   Social History Narrative    History reviewed. No pertinent family history.  PHYSICAL EXAM: Filed Vitals:   07/11/14 2143  BP: 132/68  Pulse: 95  Temp:   Resp: 21   General:  Overweight.  Well appearing. No respiratory difficulty HEENT: normal Neck: supple. JVP is elevated with +HJR. Carotids 2+ bilat; no bruits. No lymphadenopathy or thryomegaly appreciated. CV: PMI nondisplaced. Tachy,  regular with frequent ectopy. Normal S1, S2.  2/6 systolic murmur, no gallops or rubs. Lungs: crackles bilateral bases, good air movement, no rhonchi or wheezing Abdomen: obese, soft, nontender, nondistended. No hepatosplenomegaly. No bruits or masses. Good bowel sounds. Extremities: no cyanosis, clubbing, rash, trace edema Neuro: alert & oriented x 3, cranial nerves grossly intact. moves all 4 extremities w/o difficulty. Affect pleasant.  ECG: sinus tach with frequent PACs, LVH with repol abnormality (interpreted by me)  Results for orders placed or performed during the hospital encounter of 07/11/14 (from the past 24 hour(s))  CBC     Status: None   Collection Time: 07/11/14  5:49 PM  Result Value Ref Range   WBC 9.3 4.0 - 10.5 K/uL   RBC 4.85 4.22 - 5.81 MIL/uL   Hemoglobin 14.9 13.0 - 17.0 g/dL   HCT 43.8 39.0 - 52.0 %   MCV 90.3 78.0 - 100.0 fL   MCH 30.7 26.0 - 34.0 pg   MCHC 34.0 30.0 - 36.0 g/dL   RDW 13.1 11.5 - 15.5 %   Platelets 230 150 - 400 K/uL  Basic metabolic panel     Status: Abnormal   Collection Time: 07/11/14  5:49 PM  Result Value Ref Range   Sodium 139 135 - 145 mmol/L   Potassium 4.8 3.5 - 5.1 mmol/L   Chloride 106 96 - 112 mmol/L   CO2 25 19 - 32 mmol/L   Glucose, Bld 102 (H) 70 - 99 mg/dL   BUN 27 (H) 6 - 23 mg/dL   Creatinine, Ser 1.35 0.50 - 1.35 mg/dL   Calcium 9.1 8.4 - 10.5 mg/dL   GFR calc non Af Amer 56 (L) >90 mL/min   GFR calc Af Amer 64 (L) >90 mL/min   Anion gap 8  5 - 15  BNP (order ONLY if patient complains of dyspnea/SOB AND you have documented it for THIS visit)     Status: Abnormal   Collection Time: 07/11/14  5:49 PM  Result Value Ref Range   B Natriuretic Peptide 519.1 (H) 0.0 - 100.0 pg/mL  I-stat troponin, ED (not at [facility])     Status: Abnormal   Collection Time: 07/11/14  6:08 PM  Result Value Ref Range   Troponin i, poc 0.13 (HH) 0.00 - 0.08 ng/mL   Comment NOTIFIED PHYSICIAN    Comment 3          D-dimer, quantitative      Status: Abnormal   Collection Time: 07/11/14  6:29 PM  Result Value Ref Range   D-Dimer, Quant 1.44 (H) 0.00 - 0.48 ug/mL-FEU  Troponin I     Status: Abnormal   Collection Time: 07/11/14  6:29 PM  Result Value Ref Range   Troponin I 0.18 (H) <0.031 ng/mL   Dg Chest 2 View  07/11/2014   CLINICAL DATA:  Acute onset of atrial fibrillation. Weakness. Initial encounter.  EXAM: CHEST  2 VIEW  COMPARISON:  Chest radiograph performed earlier today at 3:26 p.m.  FINDINGS: There is new vascular congestion and increased interstitial markings, concerning for mild interstitial edema. No pleural effusion or pneumothorax is seen. The lungs are well expanded.  The cardiomediastinal silhouette is mildly enlarged. No acute osseous abnormalities are identified.  IMPRESSION: New vascular congestion, with mild cardiomegaly, and increased interstitial markings, concerning for mild interstitial edema.   Electronically Signed   By: Garald Balding M.D.   On: 07/11/2014 18:45   Dg Chest 2 View  07/11/2014   CLINICAL DATA:  Chest congestion, possible atrial fibrillation  EXAM: CHEST  2 VIEW  COMPARISON:  Chest x-ray of 11/22/2005  FINDINGS: No active infiltrate or effusion is seen. Moderate cardiomegaly is stable. Mediastinal and hilar contours are unremarkable. There are degenerative changes throughout the thoracic spine.  IMPRESSION: Stable moderate cardiomegaly.  No active lung disease.   Electronically Signed   By: Ivar Drape M.D.   On: 07/11/2014 17:08   Ct Angio Chest Pe W/cm &/or Wo Cm  07/11/2014   CLINICAL DATA:  Subacute onset of shortness of breath and irregular heartbeat. Initial encounter.  EXAM: CT ANGIOGRAPHY CHEST WITH CONTRAST  TECHNIQUE: Multidetector CT imaging of the chest was performed using the standard protocol during bolus administration of intravenous contrast. Multiplanar CT image reconstructions and MIPs were obtained to evaluate the vascular anatomy.  CONTRAST:  171mL OMNIPAQUE IOHEXOL 350 MG/ML  SOLN  COMPARISON:  Chest radiograph performed earlier today at 6:30 p.m.  FINDINGS: There is no evidence of pulmonary embolus.  Trace bilateral pleural effusions are noted, right larger than left, with underlying interstitial prominence and mild hazy right-sided opacity. This likely reflects mild interstitial edema. There is no evidence of pneumothorax. There is question of a small 4 mm nodule within the right middle lobe (image 48 of 87), though this could also reflect underlying airspace opacity. No masses are identified; no abnormal focal contrast enhancement is seen.  Scattered coronary artery calcifications are seen. There is mild ectasia of the aortic root, measuring up to 4.2 cm, without aneurysmal dilatation. No pericardial effusion is identified. No mediastinal lymphadenopathy is seen. The great vessels are grossly unremarkable, aside from mild scattered calcification at the origin of the great vessels. No axillary lymphadenopathy is seen. The visualized portions of the thyroid gland are unremarkable in appearance.  Scattered  hepatic cysts are seen, measuring up to 5.5 cm in size. The visualized portions of the liver and the spleen are otherwise unremarkable. The visualized portions of the gallbladder, pancreas and right adrenal gland are within normal limits. There is a 2.3 cm mildly heterogeneous mass within the left adrenal gland, which contains a focus of decreased attenuation, compatible with an adrenal adenoma. A 3 mm nonobstructing stone is noted at the upper pole of the left kidney.  No acute osseous abnormalities are seen. Anterior bridging osteophytes are seen along the lower thoracic spine.  Review of the MIP images confirms the above findings.  IMPRESSION: 1. No evidence of pulmonary embolus. 2. Trace bilateral pleural effusions, right larger than left, with underlying interstitial prominence and mild hazy right-sided airspace opacity. This likely reflects mild interstitial edema. 3. Scattered  coronary artery calcification noted. 4. Mild ectasia of the aortic root, measuring up to 4.2 cm, without aneurysmal dilatation. 5. Scattered hepatic cysts noted. 6. 2.3 cm mildly heterogeneous adrenal adenoma noted at the left adrenal gland. 7. 3 mm nonobstructing stone at the upper pole of the left kidney. 8. Question of small 4 mm nodule in the right middle lung lobe. If the patient is at high risk for bronchogenic carcinoma, follow-up chest CT at 1 year is recommended. If the patient is at low risk, no follow-up is needed. This recommendation follows the consensus statement: Guidelines for Management of Small Pulmonary Nodules Detected on CT Scans: A Statement from the [employer] as published in Radiology 2005; 237:395-400.   Electronically Signed   By: Garald Balding M.D.   On: 07/11/2014 20:25     ASSESSMENT:  59 yo man with h/o above presents with palpitations and DOE.  His history, exam findings and available data (BNP, Trop and CXR/CT) are consistent with CHF and this is a new diagnosis for him.  His palpitations are likely related to PACs but would not be surprised if he were having paroxysms of SVT.  He has a mild troponin elevation but does not have any ischemic symptoms and is likely a result of his CHF exacerbation.    PLAN/DISCUSSION: Admit to tele Lasix x 1 now, repeat prn for negative balance TTE in AM TSH to assess thyroid function given CHF and Palpitations/PACs Risk stratify with A1c and lipids given CHF and trop Further work up based on above results Incidental pulmonary nodule and adrenal adenomas to be followed up as outpatient.

## 2014-07-11 NOTE — Progress Notes (Signed)
[facility] [address], [place] [address] [phone]  Date:  07/11/2014   Name:  Donald Escobar.   DOB:  January 10, 1956   MRN:  [id number]  PCP:  No PCP Per Patient    Chief Complaint: Atrial Fibrillation and Shortness of Breath   History of Present Illness:  Donald Escobar. is a 59 y.o. very pleasant male patient who presents with the following:  He is here today with concern for a fib.  He saw the NP at his job today- she was concerned and had him come and see [place]. He has noticed a week or so of palpitations.  He first had a fib in 2003 when he had pneumonia.  He thinks there is also "some fluid in my lungs" as he has noted SOB with exertion for about 2 weeks.   He has "not really" noted chest pain "but I can tell that there is a difference."   First dx of a fib in 2003; he has had this "off an on" since then. He is not taking any medication, no blood thinner, no BB.   He has been using albuterol at night over the last few days for his SOB He does not have a cardiologist or PCP.   He has never had a stress test, stent, or cath, never had an MI as far as he knows  Admits that he does not stay in good shape, he would like to lose weight.  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    He has not noted orthopnea.  He has noted some PND recently but thought it was just wheezing.   He may get some swelling of his feet and ankles some of the time if he eats too much salt or does not move around enough.     Wt Readings from Last 3 Encounters:  07/11/14 252 lb 6.4 oz (114.488 kg)  06/21/13 241 lb (109.317 kg)  06/15/13 245 lb (111.131 kg)      There are no active problems to display for this patient.   Past Medical History  Diagnosis Date  . Asthma   . Cancer 2003    non-hodgkins lymphoma    History reviewed. No pertinent past surgical history.  History  Substance Use Topics  . Smoking status: Never Smoker   .  Smokeless tobacco: Not on file  . Alcohol Use: No    History reviewed. No pertinent family history.  Allergies  Allergen Reactions  . Sulfa Antibiotics Swelling    Lip swelling    Medication list has been reviewed and updated.  No current outpatient prescriptions on file prior to visit.   No current facility-administered medications on file prior to visit.    Review of Systems:  As per HPI- otherwise negative.   Physical Examination: Filed Vitals:   07/11/14 1543  BP: 150/76  Pulse: 102  Temp: 97.7 F (36.5 C)  Resp: 20   Filed Vitals:   07/11/14 1543  Height: 5' 5.5" (1.664 m)  Weight: 252 lb 6.4 oz (114.488 kg)   Body mass index is 41.35 kg/(m^2). Ideal Body Weight: Weight in (lb) to have BMI = 25: 152.2  GEN: WDWN, NAD, Non-toxic, A & O x 3, morbid obesity HEENT: Atraumatic, Normocephalic. Neck supple. No masses, No LAD.  Bilateral TM wnl, oropharynx normal.  PEERL,EOMI.   Ears and Nose: No external deformity. CV: RRR,  No M/G/R. No JVD. No thrill. No extra heart sounds.  Rate is a bit rapid but does not seem to be in fibrillation PULM: CTA B, no wheezes, he does have crackles in the lower lobes, right more than left.  No retractions. No resp. distress. No accessory muscle use. EXTR: No c/c/e NEURO Normal gait.  PSYCH: Normally interactive. Conversant. Not depressed or anxious appearing.  Calm demeanor.   EKG:  SR with diffuse flipped T waves and mild ST depression V4/5.    [facility] reading (PRIMARY) by  Dr. Lorelei Pont. CXR: no pulmonary effusion but he has cardiomegaly and fluid overload of bilateral lungs  Discussed with pt. He is not currently in a fib but he certainly may have been going in and out of fib recently.  I am also concerned that he may have had an MI and he currently seems to be in CHF.  Advised him that we should transfer him to the ED via EMS for further evaluation and treatment.  He adamantly declines EMS transport but did agree to drive to [facility].    Has taken asa 81 once at home.  Given 3 more tablets here prior to DC Assessment and Plan: Palpitations - Plan: EKG 12-Lead, aspirin chewable tablet 243 mg  SOB (shortness of breath) - Plan: DG Chest 2 View  Systolic congestive heart failure, unspecified congestive heart failure chronicity  Nonspecific abnormal electrocardiogram (ECG) (EKG)  Male with history of a fib here today with palpitations, chest discomfort and SOB.  Explained my concerns as above.  He refused EMS transport but is willing to drive to the ER.  He is on his way now, I called to alert charge nurse   Signed Lamar Blinks, MD

## 2014-07-11 NOTE — ED Provider Notes (Signed)
CSN: [id number]     Arrival date & time 07/11/14  1730 History   First MD Initiated Contact with Patient 07/11/14 1807     Chief Complaint  Patient presents with  . Irregular Heart Beat  . Shortness of Breath     (Consider location/radiation/quality/duration/timing/severity/associated sxs/prior Treatment) HPI  59 year old male presents with irregular heartbeat/palpitations for the past 1 week. Is also been having shortness of breath, mostly with exertion. Reports similar symptoms in 2003 when he was diagnosed with double pneumonia. At that time he did not have cough, but was on chemotherapy for non-Hodgkin's lymphoma. The patient states it feels similar to that but not as severe. Back then he had A. Fib associated with the pneumonia but has not had any since. Has currently been using his albuterol inhaler, and went to urgent care today was referred here for possible CHF. Patient has not noted any leg swelling except on days that he eats extra salt. Legs are not currently swollen. No orthopnea. No symptoms at rest except occasional palpitations. Patient is not currently on anticoagulation. Denies any recent long trips or asymmetric leg swelling. No cough or hemoptysis.  Past Medical History  Diagnosis Date  . Asthma   . Cancer 2003    non-hodgkins lymphoma   History reviewed. No pertinent past surgical history. History reviewed. No pertinent family history. History  Substance Use Topics  . Smoking status: Never Smoker   . Smokeless tobacco: Not on file  . Alcohol Use: No    Review of Systems  Constitutional: Negative for fever.  Respiratory: Positive for shortness of breath. Negative for cough.   Cardiovascular: Positive for leg swelling. Negative for chest pain.  Gastrointestinal: Negative for nausea, vomiting and abdominal pain.  All other systems reviewed and are negative.     Allergies  Sulfa antibiotics  Home Medications   Prior to Admission medications   Medication  Sig Start Date End Date Taking? Authorizing Provider  albuterol (PROVENTIL HFA;VENTOLIN HFA) 108 (90 BASE) MCG/ACT inhaler Inhale into the lungs every 6 (six) hours as needed for wheezing or shortness of breath.    Historical Provider, MD   BP 172/98 mmHg  Pulse 99  Temp(Src) 97.8 F (36.6 C) (Oral)  Resp 20  SpO2 97% Physical Exam  Constitutional: He is oriented to person, place, and time. He appears well-developed and well-nourished. No distress.  HENT:  Head: Normocephalic and atraumatic.  Right Ear: External ear normal.  Left Ear: External ear normal.  Nose: Nose normal.  Eyes: Right eye exhibits no discharge. Left eye exhibits no discharge.  Neck: Neck supple.  Cardiovascular: Regular rhythm, normal heart sounds and intact distal pulses.  Tachycardia present.   Pulses:      Radial pulses are 2+ on the right side, and 2+ on the left side.  HR in low 100s, regular  Pulmonary/Chest: Effort normal. No accessory muscle usage. No tachypnea. He has decreased breath sounds in the right lower field.  Abdominal: Soft. He exhibits no distension. There is no tenderness.  Musculoskeletal: He exhibits no edema or tenderness.  Neurological: He is alert and oriented to person, place, and time.  Skin: Skin is warm and dry. He is not diaphoretic.  Nursing note and vitals reviewed.   ED Course  Procedures (including critical care time) Labs Review Labs Reviewed  BASIC METABOLIC PANEL - Abnormal; Notable for the following:    Glucose, Bld 102 (*)    BUN 27 (*)    GFR calc non Af Wyvonnia Lora  56 (*)    GFR calc Af Amer 64 (*)    All other components within normal limits  BRAIN NATRIURETIC PEPTIDE - Abnormal; Notable for the following:    B Natriuretic Peptide 519.1 (*)    All other components within normal limits  D-DIMER, QUANTITATIVE - Abnormal; Notable for the following:    D-Dimer, Quant 1.44 (*)    All other components within normal limits  TROPONIN I - Abnormal; Notable for the following:     Troponin I 0.18 (*)    All other components within normal limits  I-STAT TROPOININ, ED - Abnormal; Notable for the following:    Troponin i, poc 0.13 (*)    All other components within normal limits  CBC  BASIC METABOLIC PANEL  TSH  TROPONIN I  TROPONIN I  TROPONIN I  HEPATIC FUNCTION PANEL  HEMOGLOBIN A1C  LIPID PANEL    Imaging Review Dg Chest 2 View  07/11/2014   CLINICAL DATA:  Acute onset of atrial fibrillation. Weakness. Initial encounter.  EXAM: CHEST  2 VIEW  COMPARISON:  Chest radiograph performed earlier today at 3:26 p.m.  FINDINGS: There is new vascular congestion and increased interstitial markings, concerning for mild interstitial edema. No pleural effusion or pneumothorax is seen. The lungs are well expanded.  The cardiomediastinal silhouette is mildly enlarged. No acute osseous abnormalities are identified.  IMPRESSION: New vascular congestion, with mild cardiomegaly, and increased interstitial markings, concerning for mild interstitial edema.   Electronically Signed   By: Garald Balding M.D.   On: 07/11/2014 18:45   Dg Chest 2 View  07/11/2014   CLINICAL DATA:  Chest congestion, possible atrial fibrillation  EXAM: CHEST  2 VIEW  COMPARISON:  Chest x-ray of 11/22/2005  FINDINGS: No active infiltrate or effusion is seen. Moderate cardiomegaly is stable. Mediastinal and hilar contours are unremarkable. There are degenerative changes throughout the thoracic spine.  IMPRESSION: Stable moderate cardiomegaly.  No active lung disease.   Electronically Signed   By: Ivar Drape M.D.   On: 07/11/2014 17:08   Ct Angio Chest Pe W/cm &/or Wo Cm  07/11/2014   CLINICAL DATA:  Subacute onset of shortness of breath and irregular heartbeat. Initial encounter.  EXAM: CT ANGIOGRAPHY CHEST WITH CONTRAST  TECHNIQUE: Multidetector CT imaging of the chest was performed using the standard protocol during bolus administration of intravenous contrast. Multiplanar CT image reconstructions and MIPs  were obtained to evaluate the vascular anatomy.  CONTRAST:  170mL OMNIPAQUE IOHEXOL 350 MG/ML SOLN  COMPARISON:  Chest radiograph performed earlier today at 6:30 p.m.  FINDINGS: There is no evidence of pulmonary embolus.  Trace bilateral pleural effusions are noted, right larger than left, with underlying interstitial prominence and mild hazy right-sided opacity. This likely reflects mild interstitial edema. There is no evidence of pneumothorax. There is question of a small 4 mm nodule within the right middle lobe (image 48 of 87), though this could also reflect underlying airspace opacity. No masses are identified; no abnormal focal contrast enhancement is seen.  Scattered coronary artery calcifications are seen. There is mild ectasia of the aortic root, measuring up to 4.2 cm, without aneurysmal dilatation. No pericardial effusion is identified. No mediastinal lymphadenopathy is seen. The great vessels are grossly unremarkable, aside from mild scattered calcification at the origin of the great vessels. No axillary lymphadenopathy is seen. The visualized portions of the thyroid gland are unremarkable in appearance.  Scattered hepatic cysts are seen, measuring up to 5.5 cm in size. The visualized  portions of the liver and the spleen are otherwise unremarkable. The visualized portions of the gallbladder, pancreas and right adrenal gland are within normal limits. There is a 2.3 cm mildly heterogeneous mass within the left adrenal gland, which contains a focus of decreased attenuation, compatible with an adrenal adenoma. A 3 mm nonobstructing stone is noted at the upper pole of the left kidney.  No acute osseous abnormalities are seen. Anterior bridging osteophytes are seen along the lower thoracic spine.  Review of the MIP images confirms the above findings.  IMPRESSION: 1. No evidence of pulmonary embolus. 2. Trace bilateral pleural effusions, right larger than left, with underlying interstitial prominence and mild  hazy right-sided airspace opacity. This likely reflects mild interstitial edema. 3. Scattered coronary artery calcification noted. 4. Mild ectasia of the aortic root, measuring up to 4.2 cm, without aneurysmal dilatation. 5. Scattered hepatic cysts noted. 6. 2.3 cm mildly heterogeneous adrenal adenoma noted at the left adrenal gland. 7. 3 mm nonobstructing stone at the upper pole of the left kidney. 8. Question of small 4 mm nodule in the right middle lung lobe. If the patient is at high risk for bronchogenic carcinoma, follow-up chest CT at 1 year is recommended. If the patient is at low risk, no follow-up is needed. This recommendation follows the consensus statement: Guidelines for Management of Small Pulmonary Nodules Detected on CT Scans: A Statement from the [employer] as published in Radiology 2005; 237:395-400.   Electronically Signed   By: Garald Balding M.D.   On: 07/11/2014 20:25     EKG Interpretation   Date/Time:  Thursday July 11 2014 17:38:37 EST Ventricular Rate:  99 PR Interval:  150 QRS Duration: 84 QT Interval:  378 QTC Calculation: 485 R Axis:   -19 Text Interpretation:  Normal sinus rhythm Left ventricular hypertrophy  Nonspecific T wave abnormality Abnormal ECG T wave inversions I, AVL new  compared to 2003 Confirmed by Oralia Criger  MD, Westlyn Glaza ([id number]) on 07/11/2014  6:11:49 PM      MDM   Final diagnoses:  Acute congestive heart failure, unspecified congestive heart failure type    Patient is well-appearing here, has no current dyspnea or chest pain while at rest. Heart rate is normal sinus rhythm, initially tachycardic but has down trended. EKG does show new T-wave inversions in 1 and aVL but this is compared to 2003. Troponin is mildly elevated, given his exertional shortness of breath and new onset CHF, this is concerning for possible MI versus heart strain. Discussed with Dr. Philbert Riser of cardiology, who recommends one dose Lovenox subcutaneous, given aspirin as  well, and will admit to cardiology. No evidence of PE as the cause of his symptoms.    Ephraim Hamburger, MD 07/11/14 2352

## 2014-07-11 NOTE — Patient Instructions (Signed)
Please proceed straight to the [facility] ER: I will call and let them know that you are on your way.   Please let them know that you have shortness of breath, may be in heart failure and that you have had chest pain

## 2014-07-11 NOTE — ED Notes (Signed)
Pt here from [facility] sent for further evaluation of an irregular heart beat.  Reports shortness of breath; denies chest pain.

## 2014-07-11 NOTE — ED Notes (Signed)
Md at bedside

## 2014-07-12 LAB — LIPID PANEL
Cholesterol: 161 mg/dL (ref 0–200)
HDL: 33 mg/dL — ABNORMAL LOW (ref >39)
LDL Cholesterol: 110 mg/dL — ABNORMAL HIGH (ref 0–99)
Total CHOL/HDL Ratio: 4.9 RATIO
Triglycerides: 89 mg/dL (ref <150)
VLDL: 18 mg/dL (ref 0–40)

## 2014-07-12 LAB — HEPATIC FUNCTION PANEL
ALBUMIN: 4.3 g/dL (ref 3.5–5.2)
ALT: 40 U/L (ref 0–53)
AST: 35 U/L (ref 0–37)
Alkaline Phosphatase: 73 U/L (ref 39–117)
BILIRUBIN DIRECT: 0.2 mg/dL (ref 0.0–0.5)
BILIRUBIN INDIRECT: 1.1 mg/dL — AB (ref 0.3–0.9)
Total Bilirubin: 1.3 mg/dL — ABNORMAL HIGH (ref 0.3–1.2)
Total Protein: 7.4 g/dL (ref 6.0–8.3)

## 2014-07-12 LAB — TROPONIN I
TROPONIN I: 0.16 ng/mL — AB (ref <0.031)
TROPONIN I: 0.13 ng/mL — AB (ref <0.031)
Troponin I: 0.14 ng/mL — ABNORMAL HIGH (ref <0.031)

## 2014-07-12 LAB — BASIC METABOLIC PANEL
Anion gap: 7 (ref 5–15)
BUN: 21 mg/dL (ref 6–23)
CO2: 29 mmol/L (ref 19–32)
Calcium: 9 mg/dL (ref 8.4–10.5)
Chloride: 102 mmol/L (ref 96–112)
Creatinine, Ser: 1.06 mg/dL (ref 0.50–1.35)
GFR calc Af Amer: 86 mL/min — ABNORMAL LOW (ref >90)
GFR calc non Af Amer: 74 mL/min — ABNORMAL LOW (ref >90)
Glucose, Bld: 90 mg/dL (ref 70–99)
POTASSIUM: 4 mmol/L (ref 3.5–5.1)
Sodium: 138 mmol/L (ref 135–145)

## 2014-07-12 LAB — TSH: TSH: 3.376 u[IU]/mL (ref 0.350–4.500)

## 2014-07-12 MED ORDER — FUROSEMIDE 40 MG PO TABS
40.0000 mg | ORAL_TABLET | Freq: Every day | ORAL | Status: DC
Start: 2014-07-12 — End: 2014-07-16
  Administered 2014-07-12 – 2014-07-16 (×5): 40 mg via ORAL
  Filled 2014-07-12 (×5): qty 1

## 2014-07-12 NOTE — Care Management Note (Unsigned)
    Page 1 of 1   07/12/2014     4:07:59 PM CARE MANAGEMENT NOTE 07/12/2014  Patient:  Donald Escobar, Donald Escobar   Account Number:  [id number]  Date Initiated:  07/12/2014  Documentation initiated by:  Garrett Mitchum  Subjective/Objective Assessment:   Pt adm on 07/11/14 with CHF.  PTA, pt lives alone and is independent.     Action/Plan:   Will follow for dc needs as pt progresses.   Anticipated DC Date:  07/14/2014   Anticipated DC Plan:  [place]  CM consult      Choice offered to / List presented to:             Status of service:  In process, will continue to follow Medicare Important Message given?   (If response is "NO", the following Medicare IM given date fields will be blank) Date Medicare IM given:   Medicare IM given by:   Date Additional Medicare IM given:   Additional Medicare IM given by:    Discharge Disposition:    Per UR Regulation:  Reviewed for med. necessity/level of care/duration of stay  If discussed at [place] of Stay Meetings, dates discussed:    Comments:

## 2014-07-12 NOTE — Progress Notes (Signed)
Patient Name: Donald Escobar. Date of Encounter: 07/12/2014     Active Problems:   Elevated troponin   Acute CHF   Lung nodule < 6cm on CT   Adenoma of left adrenal gland    SUBJECTIVE  Started having DOE in the last several wks. Better since last night after IV diuresis  CURRENT MEDS . aspirin  324 mg Oral Once  . aspirin EC  81 mg Oral Daily  . carvedilol  3.125 mg Oral BID WC  . sodium chloride  3 mL Intravenous Q12H    OBJECTIVE  Filed Vitals:   07/11/14 2215 07/11/14 2258 07/12/14 0109 07/12/14 0648  BP: 132/61 157/93 125/76 128/69  Pulse: 92 93 93 88  Temp:  97.6 F (36.4 C) 98.3 F (36.8 C) 98.5 F (36.9 C)  TempSrc:  Oral Oral Oral  Resp: 16 18 18 18   Weight:  247 lb 9.2 oz (112.3 kg)  243 lb 6.2 oz (110.4 kg)  SpO2: 94% 97% 97% 98%    Intake/Output Summary (Last 24 hours) at 07/12/14 1046 Last data filed at 07/12/14 0923  Gross per 24 hour  Intake    120 ml  Output   1726 ml  Net  -1606 ml   Filed Weights   07/11/14 2258 07/12/14 0648  Weight: 247 lb 9.2 oz (112.3 kg) 243 lb 6.2 oz (110.4 kg)    PHYSICAL EXAM  General: Pleasant, NAD. Neuro: Alert and oriented X 3. Moves all extremities spontaneously. Psych: Normal affect. HEENT:  Normal  Neck: Supple without bruits +JVD. Lungs:  Resp regular and unlabored, CTA. Heart: RRR no s3, s4, or murmurs. Abdomen: Soft, non-tender, non-distended, BS + x 4.  Extremities: No clubbing, cyanosis or edema. DP/PT/Radials 2+ and equal bilaterally.  Accessory Clinical Findings  CBC  Recent Labs  07/11/14 1749  WBC 9.3  HGB 14.9  HCT 43.8  MCV 90.3  PLT 025   Basic Metabolic Panel  Recent Labs  07/11/14 1749 07/12/14 0434  NA 139 138  K 4.8 4.0  CL 106 102  CO2 25 29  GLUCOSE 102* 90  BUN 27* 21  CREATININE 1.35 1.06  CALCIUM 9.1 9.0   Liver Function Tests  Recent Labs  07/12/14 0107  AST 35  ALT 40  ALKPHOS 73  BILITOT 1.3*  PROT 7.4  ALBUMIN 4.3   Cardiac  Enzymes  Recent Labs  07/11/14 1829 07/12/14 0107 07/12/14 0434  TROPONINI 0.18* 0.16* 0.14*   D-Dimer  Recent Labs  07/11/14 1829  DDIMER 1.44*   Fasting Lipid Panel  Recent Labs  07/12/14 0107  CHOL 161  HDL 33*  LDLCALC 110*  TRIG 89  CHOLHDL 4.9   Thyroid Function Tests  Recent Labs  07/12/14 0107  TSH 3.376    TELE NSR with HR 80-90s    ECG  Sinus tach with TWI in I and aVL  Echocardiogram  pending    Radiology/Studies  Dg Chest 2 View  07/11/2014   CLINICAL DATA:  Acute onset of atrial fibrillation. Weakness. Initial encounter.  EXAM: CHEST  2 VIEW  COMPARISON:  Chest radiograph performed earlier today at 3:26 p.m.  FINDINGS: There is new vascular congestion and increased interstitial markings, concerning for mild interstitial edema. No pleural effusion or pneumothorax is seen. The lungs are well expanded.  The cardiomediastinal silhouette is mildly enlarged. No acute osseous abnormalities are identified.  IMPRESSION: New vascular congestion, with mild cardiomegaly, and increased interstitial markings, concerning for mild interstitial  edema.   Electronically Signed   By: Garald Balding M.D.   On: 07/11/2014 18:45   Dg Chest 2 View  07/11/2014   CLINICAL DATA:  Chest congestion, possible atrial fibrillation  EXAM: CHEST  2 VIEW  COMPARISON:  Chest x-ray of 11/22/2005  FINDINGS: No active infiltrate or effusion is seen. Moderate cardiomegaly is stable. Mediastinal and hilar contours are unremarkable. There are degenerative changes throughout the thoracic spine.  IMPRESSION: Stable moderate cardiomegaly.  No active lung disease.   Electronically Signed   By: Ivar Drape M.D.   On: 07/11/2014 17:08   Ct Angio Chest Pe W/cm &/or Wo Cm  07/11/2014   CLINICAL DATA:  Subacute onset of shortness of breath and irregular heartbeat. Initial encounter.  EXAM: CT ANGIOGRAPHY CHEST WITH CONTRAST  TECHNIQUE: Multidetector CT imaging of the chest was performed using the  standard protocol during bolus administration of intravenous contrast. Multiplanar CT image reconstructions and MIPs were obtained to evaluate the vascular anatomy.  CONTRAST:  153mL OMNIPAQUE IOHEXOL 350 MG/ML SOLN  COMPARISON:  Chest radiograph performed earlier today at 6:30 p.m.  FINDINGS: There is no evidence of pulmonary embolus.  Trace bilateral pleural effusions are noted, right larger than left, with underlying interstitial prominence and mild hazy right-sided opacity. This likely reflects mild interstitial edema. There is no evidence of pneumothorax. There is question of a small 4 mm nodule within the right middle lobe (image 48 of 87), though this could also reflect underlying airspace opacity. No masses are identified; no abnormal focal contrast enhancement is seen.  Scattered coronary artery calcifications are seen. There is mild ectasia of the aortic root, measuring up to 4.2 cm, without aneurysmal dilatation. No pericardial effusion is identified. No mediastinal lymphadenopathy is seen. The great vessels are grossly unremarkable, aside from mild scattered calcification at the origin of the great vessels. No axillary lymphadenopathy is seen. The visualized portions of the thyroid gland are unremarkable in appearance.  Scattered hepatic cysts are seen, measuring up to 5.5 cm in size. The visualized portions of the liver and the spleen are otherwise unremarkable. The visualized portions of the gallbladder, pancreas and right adrenal gland are within normal limits. There is a 2.3 cm mildly heterogeneous mass within the left adrenal gland, which contains a focus of decreased attenuation, compatible with an adrenal adenoma. A 3 mm nonobstructing stone is noted at the upper pole of the left kidney.  No acute osseous abnormalities are seen. Anterior bridging osteophytes are seen along the lower thoracic spine.  Review of the MIP images confirms the above findings.  IMPRESSION: 1. No evidence of pulmonary  embolus. 2. Trace bilateral pleural effusions, right larger than left, with underlying interstitial prominence and mild hazy right-sided airspace opacity. This likely reflects mild interstitial edema. 3. Scattered coronary artery calcification noted. 4. Mild ectasia of the aortic root, measuring up to 4.2 cm, without aneurysmal dilatation. 5. Scattered hepatic cysts noted. 6. 2.3 cm mildly heterogeneous adrenal adenoma noted at the left adrenal gland. 7. 3 mm nonobstructing stone at the upper pole of the left kidney. 8. Question of small 4 mm nodule in the right middle lung lobe. If the patient is at high risk for bronchogenic carcinoma, follow-up chest CT at 1 year is recommended. If the patient is at low risk, no follow-up is needed. This recommendation follows the consensus statement: Guidelines for Management of Small Pulmonary Nodules Detected on CT Scans: A Statement from the [employer] as published in Radiology 2005; 237:395-400.  Electronically Signed   By: Garald Balding M.D.   On: 07/11/2014 20:25    ASSESSMENT AND PLAN  1. Acute heart failure with DOE: new for this patient  - unclear if systolic vs diastolic, TSH normal. Trace bilateral pleural effusion  - pending echocardiogram  - depend on echo result and EF, will consider myoview vs cath later  - HF symptom improved after IV diuresis, no LE edema, lung essentially clear, still has elevated JVD, however close to euvolemic, will start on 40mg  daily of PO lasix  2. Palpitation: frequent PACs noted on overnight telemetry  3. Elevated trop: trending down, unclear if demand ischemia in the setting of HF or ischemia. Denies ever having CP. Further ischemic workup depend on echo result  4. Non-hodgkin's lymphoma 5. HTN 6. Asthma 7. 90mm R middle lobe lung nodule noted on CTA 8. Elevated d-dimer: CTA negative for PE 9. Herterogeneous adrenal adenoma noted at L adrenal gland 10. 93mm nonobstructing stone in upper pole of L  kidney  Signed, Woodward Ku Pager: [phone]  Agree with note by Almyra Deforest PA-C  Pt with new onset CHF with low level + trop and elevated BNP. Feels better after diuresis. 2D pending. Lungs clear. EKG w/o acute changes. If LVD with need R/L heart cath tgo R/O ischemic etiology  [name], M.D., Rosalita Chessman [facility], [place] [address] [place], [place]  [id number]  [phone] 07/12/2014 11:26 AM

## 2014-07-12 NOTE — Progress Notes (Addendum)
Heart Failure Navigator Consult Note  Presentation: Donald Pruett. is a 59 yo man with history of asthma (no PFTs per pt), non-hodgkins lymphoma and HTN who presents with palpitations. He saw the NP at work who recommended he get further evaluation based on an abnormal ECG. He reports that over the last couple of weeks he has felt his heart beating irregularly, can be fast, occuring daily. Episodes last up to 20 minutes but vary. However, he states that he doesn't feel like his heart beat is ever normal during this time frame. He has also had worsening DOE. Currently wouldn't be able to climb a flight of stairs. He is able to walk on flat ground but has to pace himself due to DOE.  Past Medical History  Diagnosis Date  . Asthma   . Cancer 2003    non-hodgkins lymphoma  . Hypertension   . Acute CHF 07/11/2014    History   Social History  . Marital Status: Single    Spouse Name: N/A    Number of Children: N/A  . Years of Education: N/A   Social History Main Topics  . Smoking status: Never Smoker   . Smokeless tobacco: None  . Alcohol Use: No  . Drug Use: No  . Sexual Activity: No   Other Topics Concern  . None   Social History Narrative    ECHO: pending  BNP No results found for: PROBNP  Education Assessment and Provision:  Detailed education and instructions provided on heart failure disease management including the following:  Signs and symptoms of Heart Failure When to call the physician Importance of daily weights Low sodium diet Fluid restriction Medication management Anticipated future follow-up appointments  Patient education given on each of the above topics.  Patient acknowledges understanding and acceptance of all instructions.  I spoke briefly with Donald Escobar regarding a new diagnosis of HF.  I did review a low sodium diet and high sodium foods to avoid.  He does say that he uses no added salt and that he is aware and reads labels regarding  Sodium content in foods.  He admits that he has recently had some increased swelling in LE and wheezing with breathing during activity at times--(he says NP at work attributed to asthma).   I will plan to return later today after echo performed and resulted to reinforce education.  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

## 2014-07-13 DIAGNOSIS — I509 Heart failure, unspecified: Secondary | ICD-10-CM

## 2014-07-13 DIAGNOSIS — I5021 Acute systolic (congestive) heart failure: Principal | ICD-10-CM

## 2014-07-13 DIAGNOSIS — I2510 Atherosclerotic heart disease of native coronary artery without angina pectoris: Secondary | ICD-10-CM

## 2014-07-13 LAB — BASIC METABOLIC PANEL
Anion gap: 7 (ref 5–15)
BUN: 18 mg/dL (ref 6–23)
CO2: 25 mmol/L (ref 19–32)
Calcium: 8.9 mg/dL (ref 8.4–10.5)
Chloride: 103 mmol/L (ref 96–112)
Creatinine, Ser: 1.01 mg/dL (ref 0.50–1.35)
GFR calc non Af Amer: 79 mL/min — ABNORMAL LOW (ref >90)
GLUCOSE: 93 mg/dL (ref 70–99)
POTASSIUM: 3.9 mmol/L (ref 3.5–5.1)
SODIUM: 135 mmol/L (ref 135–145)

## 2014-07-13 LAB — HEMOGLOBIN A1C
HEMOGLOBIN A1C: 5.9 % — AB (ref 4.8–5.6)
Mean Plasma Glucose: 123 mg/dL

## 2014-07-13 NOTE — Progress Notes (Signed)
  Echocardiogram 2D Echocardiogram has been performed.  Donald Escobar R 07/13/2014, 12:00 PM

## 2014-07-13 NOTE — Progress Notes (Signed)
Patient Name: Donald Escobar. Date of Encounter: 07/13/2014  Active Problems:   Elevated troponin   Acute CHF   Lung nodule < 6cm on CT   Adenoma of left adrenal gland   Length of Stay: 2  SUBJECTIVE  Substantially better, essentially back to normal. Just 1 L net diuresis. History of chemotherapy for NHL of brain, complicated by "double pneumonia, fluid in both lungs" in 2003. Chest CTA shows incidental finding of fairly heavy calcification in the distal left main and proximal LAD coronary arteries. BP relatively high, creatinine normal. Minimal elevation in troponin. Echo pending.  CURRENT MEDS . aspirin  324 mg Oral Once  . aspirin EC  81 mg Oral Daily  . carvedilol  3.125 mg Oral BID WC  . furosemide  40 mg Oral Daily  . sodium chloride  3 mL Intravenous Q12H    OBJECTIVE   Intake/Output Summary (Last 24 hours) at 07/13/14 1047 Last data filed at 07/13/14 0906  Gross per 24 hour  Intake   1323 ml  Output    550 ml  Net    773 ml   Filed Weights   07/11/14 2258 07/12/14 0648 07/13/14 0629  Weight: 247 lb 9.2 oz (112.3 kg) 243 lb 6.2 oz (110.4 kg) 243 lb 14.4 oz (110.632 kg)    PHYSICAL EXAM Filed Vitals:   07/12/14 1350 07/12/14 2016 07/12/14 2120 07/13/14 0629  BP: 133/67 124/73 132/75 146/79  Pulse: 94 89 87 88  Temp: 97.5 F (36.4 C)  98.4 F (36.9 C) 97.8 F (36.6 C)  TempSrc: Oral  Oral Oral  Resp: 20  18 18   Height:  5\' 5"  (1.651 m)    Weight:    243 lb 14.4 oz (110.632 kg)  SpO2: 96%  97% 98%   General: Alert, oriented x3, no distress Head: no evidence of trauma, PERRL, EOMI, no exophtalmos or lid lag, no myxedema, no xanthelasma; normal ears, nose and oropharynx Neck: normal jugular venous pulsations and no hepatojugular reflux; brisk carotid pulses without delay and no carotid bruits Chest: clear to auscultation, no signs of consolidation by percussion or palpation, normal fremitus, symmetrical and full respiratory excursions Cardiovascular:  normal position and quality of the apical impulse, regular rhythm, normal first and second heart sounds, no rubs or gallops, 3/6 holosystolic murmur at LLSB, not radiating to carotids. Abdomen: no tenderness or distention, no masses by palpation, no abnormal pulsatility or arterial bruits, normal bowel sounds, no hepatosplenomegaly Extremities: no clubbing, cyanosis or edema; 2+ radial, ulnar and brachial pulses bilaterally; 2+ right femoral, posterior tibial and dorsalis pedis pulses; 2+ left femoral, posterior tibial and dorsalis pedis pulses; no subclavian or femoral bruits Neurological: grossly nonfocal  LABS  CBC  Recent Labs  07/11/14 1749  WBC 9.3  HGB 14.9  HCT 43.8  MCV 90.3  PLT 503   Basic Metabolic Panel  Recent Labs  07/12/14 0434 07/13/14 0532  NA 138 135  K 4.0 3.9  CL 102 103  CO2 29 25  GLUCOSE 90 93  BUN 21 18  CREATININE 1.06 1.01  CALCIUM 9.0 8.9   Liver Function Tests  Recent Labs  07/12/14 0107  AST 35  ALT 40  ALKPHOS 73  BILITOT 1.3*  PROT 7.4  ALBUMIN 4.3   No results for input(s): LIPASE, AMYLASE in the last 72 hours. Cardiac Enzymes  Recent Labs  07/12/14 0107 07/12/14 0434 07/12/14 1040  TROPONINI 0.16* 0.14* 0.13*   BNP Invalid input(s): POCBNP D-Dimer  Recent Labs  07/11/14 1829  DDIMER 1.44*   Hemoglobin A1C  Recent Labs  07/12/14 0107  HGBA1C 5.9*   Fasting Lipid Panel  Recent Labs  07/12/14 0107  CHOL 161  HDL 33*  LDLCALC 110*  TRIG 89  CHOLHDL 4.9   Thyroid Function Tests  Recent Labs  07/12/14 0107  TSH 3.376    Radiology Studies Imaging results have been reviewed and Dg Chest 2 View  07/11/2014   CLINICAL DATA:  Acute onset of atrial fibrillation. Weakness. Initial encounter.  EXAM: CHEST  2 VIEW  COMPARISON:  Chest radiograph performed earlier today at 3:26 p.m.  FINDINGS: There is new vascular congestion and increased interstitial markings, concerning for mild interstitial edema. No  pleural effusion or pneumothorax is seen. The lungs are well expanded.  The cardiomediastinal silhouette is mildly enlarged. No acute osseous abnormalities are identified.  IMPRESSION: New vascular congestion, with mild cardiomegaly, and increased interstitial markings, concerning for mild interstitial edema.   Electronically Signed   By: Garald Balding M.D.   On: 07/11/2014 18:45   Dg Chest 2 View  07/11/2014   CLINICAL DATA:  Chest congestion, possible atrial fibrillation  EXAM: CHEST  2 VIEW  COMPARISON:  Chest x-ray of 11/22/2005  FINDINGS: No active infiltrate or effusion is seen. Moderate cardiomegaly is stable. Mediastinal and hilar contours are unremarkable. There are degenerative changes throughout the thoracic spine.  IMPRESSION: Stable moderate cardiomegaly.  No active lung disease.   Electronically Signed   By: Ivar Drape M.D.   On: 07/11/2014 17:08   Ct Angio Chest Pe W/cm &/or Wo Cm  07/11/2014   CLINICAL DATA:  Subacute onset of shortness of breath and irregular heartbeat. Initial encounter.  EXAM: CT ANGIOGRAPHY CHEST WITH CONTRAST  TECHNIQUE: Multidetector CT imaging of the chest was performed using the standard protocol during bolus administration of intravenous contrast. Multiplanar CT image reconstructions and MIPs were obtained to evaluate the vascular anatomy.  CONTRAST:  179mL OMNIPAQUE IOHEXOL 350 MG/ML SOLN  COMPARISON:  Chest radiograph performed earlier today at 6:30 p.m.  FINDINGS: There is no evidence of pulmonary embolus.  Trace bilateral pleural effusions are noted, right larger than left, with underlying interstitial prominence and mild hazy right-sided opacity. This likely reflects mild interstitial edema. There is no evidence of pneumothorax. There is question of a small 4 mm nodule within the right middle lobe (image 48 of 87), though this could also reflect underlying airspace opacity. No masses are identified; no abnormal focal contrast enhancement is seen.  Scattered  coronary artery calcifications are seen. There is mild ectasia of the aortic root, measuring up to 4.2 cm, without aneurysmal dilatation. No pericardial effusion is identified. No mediastinal lymphadenopathy is seen. The great vessels are grossly unremarkable, aside from mild scattered calcification at the origin of the great vessels. No axillary lymphadenopathy is seen. The visualized portions of the thyroid gland are unremarkable in appearance.  Scattered hepatic cysts are seen, measuring up to 5.5 cm in size. The visualized portions of the liver and the spleen are otherwise unremarkable. The visualized portions of the gallbladder, pancreas and right adrenal gland are within normal limits. There is a 2.3 cm mildly heterogeneous mass within the left adrenal gland, which contains a focus of decreased attenuation, compatible with an adrenal adenoma. A 3 mm nonobstructing stone is noted at the upper pole of the left kidney.  No acute osseous abnormalities are seen. Anterior bridging osteophytes are seen along the lower thoracic spine.  Review  of the MIP images confirms the above findings.  IMPRESSION: 1. No evidence of pulmonary embolus. 2. Trace bilateral pleural effusions, right larger than left, with underlying interstitial prominence and mild hazy right-sided airspace opacity. This likely reflects mild interstitial edema. 3. Scattered coronary artery calcification noted. 4. Mild ectasia of the aortic root, measuring up to 4.2 cm, without aneurysmal dilatation. 5. Scattered hepatic cysts noted. 6. 2.3 cm mildly heterogeneous adrenal adenoma noted at the left adrenal gland. 7. 3 mm nonobstructing stone at the upper pole of the left kidney. 8. Question of small 4 mm nodule in the right middle lung lobe. If the patient is at high risk for bronchogenic carcinoma, follow-up chest CT at 1 year is recommended. If the patient is at low risk, no follow-up is needed. This recommendation follows the consensus statement:  Guidelines for Management of Small Pulmonary Nodules Detected on CT Scans: A Statement from the [place] as published in Radiology 2005; 237:395-400.   Electronically Signed   By: Garald Balding M.D.   On: 07/11/2014 20:25    TELE NSR  ECG NSR, questionable LVH, T wave inversion I and aVL  ASSESSMENT AND PLAN  Improved CHF, EF not known. Differential diagnosis includes anthracycline related cardiomyopathy and ischemic cardiomyopathy. If echo shows regional wall motion abnormalities or low EF, recommend heart cath on Monday. If EF/wall motion are normal, nuclear perfusion study. Add ACE inhibitor. Po diuretics. Discussed daily weights and sodium restriction.   Sanda Klein, MD, [facility] [phone] office [phone] pager 07/13/2014 10:47 AM

## 2014-07-14 DIAGNOSIS — I5021 Acute systolic (congestive) heart failure: Secondary | ICD-10-CM

## 2014-07-14 DIAGNOSIS — I351 Nonrheumatic aortic (valve) insufficiency: Secondary | ICD-10-CM

## 2014-07-14 LAB — BASIC METABOLIC PANEL
Anion gap: 7 (ref 5–15)
BUN: 14 mg/dL (ref 6–23)
CALCIUM: 9.2 mg/dL (ref 8.4–10.5)
CO2: 27 mmol/L (ref 19–32)
Chloride: 103 mmol/L (ref 96–112)
Creatinine, Ser: 1.09 mg/dL (ref 0.50–1.35)
GFR calc non Af Amer: 72 mL/min — ABNORMAL LOW (ref >90)
GFR, EST AFRICAN AMERICAN: 83 mL/min — AB (ref >90)
GLUCOSE: 100 mg/dL — AB (ref 70–99)
Potassium: 4.4 mmol/L (ref 3.5–5.1)
Sodium: 137 mmol/L (ref 135–145)

## 2014-07-14 MED ORDER — SODIUM CHLORIDE 0.9 % IV SOLN
INTRAVENOUS | Status: DC
  Administered 2014-07-15: 06:00:00 via INTRAVENOUS

## 2014-07-14 MED ORDER — PNEUMOCOCCAL VAC POLYVALENT 25 MCG/0.5ML IJ INJ
0.5000 mL | INJECTION | INTRAMUSCULAR | Status: AC
  Administered 2014-07-15: 0.5 mL via INTRAMUSCULAR
  Filled 2014-07-14: qty 0.5

## 2014-07-14 MED ORDER — ASPIRIN 81 MG PO CHEW
81.0000 mg | CHEWABLE_TABLET | ORAL | Status: AC
  Administered 2014-07-15: 81 mg via ORAL
  Filled 2014-07-14: qty 1

## 2014-07-14 MED ORDER — SODIUM CHLORIDE 0.9 % IV SOLN: 250.0000 mL | INTRAVENOUS | Status: DC | PRN

## 2014-07-14 MED ORDER — SODIUM CHLORIDE 0.9 % IJ SOLN
3.0000 mL | Freq: Two times a day (BID) | INTRAMUSCULAR | Status: DC
  Administered 2014-07-14: 3 mL via INTRAVENOUS

## 2014-07-14 MED ORDER — SODIUM CHLORIDE 0.9 % IJ SOLN: 3.0000 mL | INTRAMUSCULAR | Status: DC | PRN

## 2014-07-14 NOTE — Progress Notes (Signed)
Patient Name: Donald Escobar. Date of Encounter: 07/14/2014  Active Problems:   Elevated troponin   Acute CHF   Lung nodule < 6cm on CT   Adenoma of left adrenal gland   Length of Stay: 3  SUBJECTIVE  Markedly improved dyspnea. Echo shows at least moderate (possibly severe) aortic insufficiency, dilated LV and severely depressed LVEF. Echo (in echo PACS system, not in [place]) from 2007 showed moderate AI, normal LVEF and normal size LV.  CURRENT MEDS . aspirin  324 mg Oral Once  . aspirin EC  81 mg Oral Daily  . carvedilol  3.125 mg Oral BID WC  . furosemide  40 mg Oral Daily  . sodium chloride  3 mL Intravenous Q12H    OBJECTIVE   Intake/Output Summary (Last 24 hours) at 07/14/14 1016 Last data filed at 07/14/14 0814  Gross per 24 hour  Intake    940 ml  Output   1650 ml  Net   -710 ml   Filed Weights   07/12/14 0648 07/13/14 0629 07/14/14 0507  Weight: 243 lb 6.2 oz (110.4 kg) 243 lb 14.4 oz (110.632 kg) 242 lb 1.6 oz (109.816 kg)    PHYSICAL EXAM Filed Vitals:   07/13/14 0629 07/13/14 1438 07/13/14 2054 07/14/14 0507  BP: 146/79 125/70 119/67 128/76  Pulse: 88 85 87 85  Temp: 97.8 F (36.6 C) 97.5 F (36.4 C) 98.1 F (36.7 C) 97.9 F (36.6 C)  TempSrc: Oral Oral Oral Oral  Resp: 18 20 18 18   Height:      Weight: 243 lb 14.4 oz (110.632 kg)   242 lb 1.6 oz (109.816 kg)  SpO2: 98% 98% 95% 98%   General: Alert, oriented x3, no distress Head: no evidence of trauma, PERRL, EOMI, no exophtalmos or lid lag, no myxedema, no xanthelasma; normal ears, nose and oropharynx Neck: normal jugular venous pulsations and no hepatojugular reflux; brisk carotid pulses without delay and no carotid bruits Chest: clear to auscultation, no signs of consolidation by percussion or palpation, normal fremitus, symmetrical and full respiratory excursions Cardiovascular: normal position and quality of the apical impulse, regular rhythm, normal first and second heart sounds, no rubs  or gallops, 2/6 systolic LSB ejection murmur, 1/6 diastolic decrescendo murmur Abdomen: no tenderness or distention, no masses by palpation, no abnormal pulsatility or arterial bruits, normal bowel sounds, no hepatosplenomegaly Extremities: no clubbing, cyanosis or edema; 2+ radial, ulnar and brachial pulses bilaterally; 2+ right femoral, posterior tibial and dorsalis pedis pulses; 2+ left femoral, posterior tibial and dorsalis pedis pulses; no subclavian or femoral bruits Neurological: grossly nonfocal  LABS  CBC  Recent Labs  07/11/14 1749  WBC 9.3  HGB 14.9  HCT 43.8  MCV 90.3  PLT 952   Basic Metabolic Panel  Recent Labs  07/13/14 0532 07/14/14 0329  NA 135 137  K 3.9 4.4  CL 103 103  CO2 25 27  GLUCOSE 93 100*  BUN 18 14  CREATININE 1.01 1.09  CALCIUM 8.9 9.2   Liver Function Tests  Recent Labs  07/12/14 0107  AST 35  ALT 40  ALKPHOS 73  BILITOT 1.3*  PROT 7.4  ALBUMIN 4.3   No results for input(s): LIPASE, AMYLASE in the last 72 hours. Cardiac Enzymes  Recent Labs  07/12/14 0107 07/12/14 0434 07/12/14 1040  TROPONINI 0.16* 0.14* 0.13*   BNP Invalid input(s): POCBNP D-Dimer  Recent Labs  07/11/14 1829  DDIMER 1.44*   Hemoglobin A1C  Recent Labs  07/12/14  0107  HGBA1C 5.9*   Fasting Lipid Panel  Recent Labs  07/12/14 0107  CHOL 161  HDL 33*  LDLCALC 110*  TRIG 89  CHOLHDL 4.9   Thyroid Function Tests  Recent Labs  07/12/14 0107  TSH 3.376    Radiology Studies Imaging results have been reviewed and No results found.  TELE NSR   ASSESSMENT AND PLAN  Evidence suggests primary aortic insufficiency with secondary LV dilation and dysfunction. The aortic valve is trileaflet, with thickened, slightly deformed valves, but the exact mechanism of insufficiency is unclear. Recommend TEE.  Will also need right and left heart cath, probably eventually AVR. Risks/benefits of TEE and heart cath discussed in detail. He agrees to  proceed.  Sanda Klein, MD, [facility] [phone] office [phone] pager 07/14/2014 10:16 AM

## 2014-07-15 ENCOUNTER — Encounter (HOSPITAL_COMMUNITY)
Admission: EM | Disposition: A | Discharge status: Home / Self Care | Payer: Self-pay | Source: Home / Self Care | Attending: Cardiology

## 2014-07-15 ENCOUNTER — Encounter (HOSPITAL_COMMUNITY): Payer: Self-pay | Admitting: *Deleted

## 2014-07-15 ENCOUNTER — Encounter (HOSPITAL_COMMUNITY): Payer: BLUE CROSS/BLUE SHIELD

## 2014-07-15 ENCOUNTER — Other Ambulatory Visit: Payer: Self-pay | Admitting: *Deleted

## 2014-07-15 DIAGNOSIS — I2510 Atherosclerotic heart disease of native coronary artery without angina pectoris: Secondary | ICD-10-CM

## 2014-07-15 DIAGNOSIS — R911 Solitary pulmonary nodule: Secondary | ICD-10-CM

## 2014-07-15 DIAGNOSIS — I351 Nonrheumatic aortic (valve) insufficiency: Secondary | ICD-10-CM

## 2014-07-15 DIAGNOSIS — I350 Nonrheumatic aortic (valve) stenosis: Secondary | ICD-10-CM

## 2014-07-15 HISTORY — PX: LEFT AND RIGHT HEART CATHETERIZATION WITH CORONARY ANGIOGRAM: SHX5449

## 2014-07-15 HISTORY — PX: TEE WITHOUT CARDIOVERSION: SHX5443

## 2014-07-15 LAB — POCT I-STAT 3, ART BLOOD GAS (G3+)
Acid-base deficit: 3 mmol/L — ABNORMAL HIGH (ref 0.0–2.0)
Bicarbonate: 22 mEq/L (ref 20.0–24.0)
O2 Saturation: 98 %
PO2 ART: 113 mmHg — AB (ref 80.0–100.0)
TCO2: 23 mmol/L (ref 0–100)
pCO2 arterial: 38.2 mmHg (ref 35.0–45.0)
pH, Arterial: 7.37 (ref 7.350–7.450)

## 2014-07-15 LAB — PROTIME-INR
INR: 1.1 (ref 0.00–1.49)
PROTHROMBIN TIME: 14.4 s (ref 11.6–15.2)

## 2014-07-15 SURGERY — ECHOCARDIOGRAM, TRANSESOPHAGEAL
Anesthesia: Moderate Sedation

## 2014-07-15 SURGERY — LEFT AND RIGHT HEART CATHETERIZATION WITH CORONARY ANGIOGRAM
Anesthesia: LOCAL

## 2014-07-15 MED ORDER — FENTANYL CITRATE 0.05 MG/ML IJ SOLN
INTRAMUSCULAR | Status: DC | PRN
  Administered 2014-07-15: 25 ug via INTRAVENOUS
  Administered 2014-07-15: 50 ug via INTRAVENOUS

## 2014-07-15 MED ORDER — LIDOCAINE HCL (PF) 1 % IJ SOLN
INTRAMUSCULAR | Status: AC
  Filled 2014-07-15: qty 30

## 2014-07-15 MED ORDER — ASPIRIN 81 MG PO CHEW: 81.0000 mg | CHEWABLE_TABLET | Freq: Every day | ORAL | Status: DC

## 2014-07-15 MED ORDER — MIDAZOLAM HCL 10 MG/2ML IJ SOLN
INTRAMUSCULAR | Status: DC | PRN
  Administered 2014-07-15 (×2): 2 mg via INTRAVENOUS

## 2014-07-15 MED ORDER — SODIUM CHLORIDE 0.9 % IV SOLN: INTRAVENOUS | Status: AC

## 2014-07-15 MED ORDER — BUTAMBEN-TETRACAINE-BENZOCAINE 2-2-14 % EX AERO
INHALATION_SPRAY | CUTANEOUS | Status: DC | PRN
  Administered 2014-07-15: 2 via TOPICAL

## 2014-07-15 MED ORDER — ACETAMINOPHEN 325 MG PO TABS: 650.0000 mg | ORAL_TABLET | ORAL | Status: DC | PRN

## 2014-07-15 MED ORDER — ONDANSETRON HCL 4 MG/2ML IJ SOLN: 4.0000 mg | Freq: Four times a day (QID) | INTRAMUSCULAR | Status: DC | PRN

## 2014-07-15 MED ORDER — DIPHENHYDRAMINE HCL 50 MG/ML IJ SOLN
INTRAMUSCULAR | Status: AC
  Filled 2014-07-15: qty 1

## 2014-07-15 MED ORDER — HEPARIN (PORCINE) IN NACL 2-0.9 UNIT/ML-% IJ SOLN
INTRAMUSCULAR | Status: AC
  Filled 2014-07-15: qty 500

## 2014-07-15 MED ORDER — MORPHINE SULFATE 2 MG/ML IJ SOLN: 2.0000 mg | INTRAMUSCULAR | Status: DC | PRN

## 2014-07-15 MED ORDER — FENTANYL CITRATE 0.05 MG/ML IJ SOLN
INTRAMUSCULAR | Status: AC
  Filled 2014-07-15: qty 4

## 2014-07-15 MED ORDER — MIDAZOLAM HCL 5 MG/ML IJ SOLN
INTRAMUSCULAR | Status: AC
  Filled 2014-07-15: qty 3

## 2014-07-15 NOTE — Consult Note (Signed)
Reason for Consult: Aortic insufficiency and severe single vessel CAD Referring Physician: Drs. Donald Escobar/ Donald Escobar Donald Escobar. is an 59 y.o. male.  HPI: 59 yo man presented with a cc/o palpitations and shortness of breath   Donald Escobar is a 59 yo man with history of asthma, hypertension and non-Hodgkins lymphoma treated in 2003. He has no prior history of cardiac disease. He says that over the past 2- 3 weeks he has been getting short of breath with exertion. His work requires relatively heavy exertion and he has been getting SOB with activities he used to do easily. He also complains of his heart beating irregularly. This is episodic. It comes on randomly and lasts up to ~ 20 minutes at a time.   He went to the nurse at work who referred him for further evaluation.   He saw the NP at work who recommended he get further evaluation based on an abnormal ECG. She also gave him an inhaler, which he says does help with his breathing. He went to [facility], who referred him to the ED. In the ED his BNP was 518 and his troponin was elevated at 0.13. The troponin subsequently rose to 0.18.   He is currently pain free. He does not feel SOB at rest.  He had dental work done in December and again in January with a root canal and fillings. He does not have any dental issues currently.   Past Medical History  Diagnosis Date  . Asthma   . Cancer 2003    non-hodgkins lymphoma  . Hypertension   . Acute CHF 07/11/2014    History reviewed. No pertinent past surgical history.  History reviewed. No pertinent family history.  Social History:  reports that he has never smoked. He does not have any smokeless tobacco history on file. He reports that he does not drink alcohol or use illicit drugs.  Allergies:  Allergies  Allergen Reactions  . Sulfa Antibiotics Swelling    Lip swelling    Medications:  Scheduled: . aspirin  324 mg Oral Once  . aspirin EC  81 mg Oral Daily  . carvedilol   3.125 mg Oral BID WC  . furosemide  40 mg Oral Daily  . sodium chloride  3 mL Intravenous Q12H  . sodium chloride  3 mL Intravenous Q12H    Results for orders placed or performed during the hospital encounter of 07/11/14 (from the past 48 hour(s))  Basic metabolic panel     Status: Abnormal   Collection Time: 07/14/14  3:29 AM  Result Value Ref Range   Sodium 137 135 - 145 mmol/L   Potassium 4.4 3.5 - 5.1 mmol/L   Chloride 103 96 - 112 mmol/L   CO2 27 19 - 32 mmol/L   Glucose, Bld 100 (H) 70 - 99 mg/dL   BUN 14 6 - 23 mg/dL   Creatinine, Ser 1.09 0.50 - 1.35 mg/dL   Calcium 9.2 8.4 - 10.5 mg/dL   GFR calc non Af Amer 72 (L) >90 mL/min   GFR calc Af Amer 83 (L) >90 mL/min    Comment: (NOTE) The eGFR has been calculated using the CKD EPI equation. This calculation has not been validated in all clinical situations. eGFR's persistently <90 mL/min signify possible Chronic Kidney Disease.    Anion gap 7 5 - 15  Protime-INR     Status: None   Collection Time: 07/15/14  3:09 AM  Result Value Ref Range  Prothrombin Time 14.4 11.6 - 15.2 seconds   INR 1.10 0.00 - 1.49    No results found.  Review of Systems  Constitutional: Positive for malaise/fatigue. Negative for fever and chills.  HENT:       Had dental work in December and January- fillings  Respiratory: Positive for cough, shortness of breath and wheezing.   Cardiovascular: Positive for palpitations. Negative for chest pain, orthopnea and PND.  Gastrointestinal: Negative.   Genitourinary: Positive for frequency.  Musculoskeletal: Positive for myalgias.  Neurological: Negative.  Negative for weakness.  Endo/Heme/Allergies: Does not bruise/bleed easily.  All other systems reviewed and are negative.  Blood pressure 122/50, pulse 71, temperature 98.3 F (36.8 C), temperature source Oral, resp. rate 12, height 5' 5" (1.651 m), weight 243 lb 11.2 oz (110.542 kg), SpO2 98 %. Physical Exam  Vitals reviewed. Constitutional:  He is oriented to person, place, and time. No distress.  obese  HENT:  Head: Normocephalic and atraumatic.  Radiation induced skin changes right scalp  Eyes: EOM are normal. Pupils are equal, round, and reactive to light.  Neck: Neck supple. No thyromegaly present.  Cardiovascular: Normal rate and regular rhythm.   Murmur (3/6 systolic, 1/6 diastolic) heard. Respiratory: Effort normal and breath sounds normal. He has no wheezes. He has no rales.  GI: Soft. There is no tenderness.  Musculoskeletal: He exhibits no edema.  Lymphadenopathy:    He has no cervical adenopathy.  Neurological: He is alert and oriented to person, place, and time. No cranial nerve deficit.  No focal motor deficit  Skin: Skin is warm and dry.   Echocardiogram 07/12/14 Study Conclusions  - Left ventricle: The cavity size was moderately dilated. Left ventricular geometry showed evidence of eccentric hypertrophy. Systolic function was severely reduced. The estimated ejection fraction was in the range of 25% to 30%. Severe diffuse hypokinesis with regional variations. Possible disproportionately severe hypokinesis of the anteroseptal and anterior myocardium. Features are consistent with a pseudonormal left ventricular filling pattern, with concomitant abnormal relaxation and increased filling pressure (grade 2 diastolic dysfunction). - Aortic valve: There was mild stenosis. There was moderate to severe regurgitation. Valve area (VTI): 2.16 cm^2. Valve area (Vmax): 1.5 cm^2. Valve area (Vmean): 1.65 cm^2. - Mitral valve: Calcified annulus. Mildly thickened leaflets . - Left atrium: The atrium was mildly dilated. - Pericardium, extracardiac: A trivial pericardial effusion was identified.  TEE 07/15/14 Left Ventrical: Moderate LV dysfunction   Mitral Valve: normal , trivial Donald. The opening of the MV is limited by the AI jet   Aortic Valve: calcified. The right and left cusps are  partially fused and the noncoronary cups is immobile. Functionally a bicuspid valve. No vegetation  Tricuspid Valve: trivial TR  Pulmonic Valve: no significand PI  Left Atrium/ Left atrial appendage: no thrombi   Atrial septum: no PFO by color   Aorta: normal.  Complications: No apparent complications Patient did tolerate procedure well.  Thayer Headings, Brooke Bonito., MD, [facility] 07/15/2014, 11:02 AM  CARDIAC CATHETERIZATION HEMODYNAMICS:   AO SYSTOLIC/AO DIASTOLIC: 448/18  LV SYSTOLIC/LV DIASTOLIC: 563/14  ANGIOGRAPHIC RESULTS:   1. Left main; normal  2. LAD; 90% proximal at the takeoff of a moderate-sized diagonal branch 3. Left circumflex; dominant and normal.  4. Right coronary artery; nondominant and normal 5.LIMA was subselectively visualized was widely patent. It was suitable for use during coronary artery bypass grafting 7. Left ventriculography;was not performed today. 8. Supravalvular aortogram was performed using 20 mL of Visipaque dye at 20 mL/s. The  descending thoracic aorta was mildly dilated. There was 2+ angiographic aortic insufficiency.  IMPRESSION:Donald. Stailey has high-grade proximal LAD diagonal branch bifurcation disease, moderate-to-severe aortic insufficiency with moderate to severe LV dysfunction and a generous thoracic aorta. I'm going to get a CT entrance ramp measure his descending thoracic aorta. We will get the cardiothoracic surgeons involved in his care to consult for potential aortic valve replacement plus or minus Bentall procedure and coronary artery bypass grafting.  [name] MD, [facility] 07/15/2014 12:17 PM Impressions:  - Compared to echo report 10/2005, LVEF has diminished (previously normal) and AI is worse.   Assessment/Plan: 59 yo man with moderately severe AI, severe single vessel CAD and impaired LV function(EF 25%). He presented with CHF and modestly elevated troponin. He has been having palpitations, which are suspicious for  paroxysmal atrial fibrillation. He needs CABG and AVR for survival benefit and relief of symptoms. His aorta is modestly enlarged at around 4 cm on CT.  I discussed the findings with him. In my opinion he would benefit symptomatically and have improved survival with AVR, CABG. He needs grafts to the LAD and diagonal. We discussed the options of tissue and mechanical valves and the issues related to them(anticoagulation and longevity).   We discussed the general nature of the procedure, the need for general anesthesia, and the incisions to be used. We discussed the expected hospital stay, overall recovery and short and long term outcomes. I reviewed the indications, risks, benefits and alternatives. He understands the risks include, but are not limited to death, stroke, MI, DVT/PE, bleeding, possible need for transfusion, infections, atrial fibrillation, heart block, diaphragm dysfunction, pleural effusions, and other organ system dysfunction including respiratory, renal, or GI complications.   He is agreeable to surgery but says he needs to go home and take care of "some things" before having it done. Will discuss with Cardiology.  HENDRICKSON,STEVEN C 07/15/2014, 2:35 PM

## 2014-07-15 NOTE — H&P (View-Only) (Signed)
SUBJECTIVE:  Patient feels well. Breathing much better after diuresis  OBJECTIVE:   Vitals:   Filed Vitals:   07/14/14 0507 07/14/14 1345 07/14/14 2028 07/15/14 0542  BP: 128/76 130/65 130/63 127/58  Pulse: 85 79 83 80  Temp: 97.9 F (36.6 C) 97.4 F (36.3 C)  98 F (36.7 C)  TempSrc: Oral Oral  Oral  Resp: 18 20 20 18   Height:      Weight: 242 lb 1.6 oz (109.816 kg)   243 lb 11.2 oz (110.542 kg)  SpO2: 98% 97% 96% 99%   I&O's:   Intake/Output Summary (Last 24 hours) at 07/15/14 9798 Last data filed at 07/15/14 9211  Gross per 24 hour  Intake 403.17 ml  Output   1950 ml  Net -1546.83 ml   TELEMETRY: Reviewed telemetry pt in normal sinus rhythm:     PHYSICAL EXAM General: Well developed, well nourished, in no acute distress Head:   Normal cephalic and atramatic  Lungs:   Clear bilaterally to auscultation. Heart:   HRRR S1 S2  No JVD.  2/6 systolic murmur, 2/6 diastolic murmur Abdomen: abdomen soft and non-tender Msk:  Back normal,  Normal strength and tone for age. Extremities: No lower extremity edema.   Neuro: Alert and oriented. Psych:  Normal affect, responds appropriately Skin: No rash   LABS: Basic Metabolic Panel:  Recent Labs  07/13/14 0532 07/14/14 0329  NA 135 137  K 3.9 4.4  CL 103 103  CO2 25 27  GLUCOSE 93 100*  BUN 18 14  CREATININE 1.01 1.09  CALCIUM 8.9 9.2   Liver Function Tests: No results for input(s): AST, ALT, ALKPHOS, BILITOT, PROT, ALBUMIN in the last 72 hours. No results for input(s): LIPASE, AMYLASE in the last 72 hours. CBC: No results for input(s): WBC, NEUTROABS, HGB, HCT, MCV, PLT in the last 72 hours. Cardiac Enzymes:  Recent Labs  07/12/14 1040  TROPONINI 0.13*   BNP: Invalid input(s): POCBNP D-Dimer: No results for input(s): DDIMER in the last 72 hours. Hemoglobin A1C: No results for input(s): HGBA1C in the last 72 hours. Fasting Lipid Panel: No results for input(s): CHOL, HDL, LDLCALC, TRIG, CHOLHDL,  LDLDIRECT in the last 72 hours. Thyroid Function Tests: No results for input(s): TSH, T4TOTAL, T3FREE, THYROIDAB in the last 72 hours.  Invalid input(s): FREET3 Anemia Panel: No results for input(s): VITAMINB12, FOLATE, FERRITIN, TIBC, IRON, RETICCTPCT in the last 72 hours. Coag Panel:   Lab Results  Component Value Date   INR 1.10 07/15/2014    RADIOLOGY: Dg Chest 2 View  07/11/2014   CLINICAL DATA:  Acute onset of atrial fibrillation. Weakness. Initial encounter.  EXAM: CHEST  2 VIEW  COMPARISON:  Chest radiograph performed earlier today at 3:26 p.m.  FINDINGS: There is new vascular congestion and increased interstitial markings, concerning for mild interstitial edema. No pleural effusion or pneumothorax is seen. The lungs are well expanded.  The cardiomediastinal silhouette is mildly enlarged. No acute osseous abnormalities are identified.  IMPRESSION: New vascular congestion, with mild cardiomegaly, and increased interstitial markings, concerning for mild interstitial edema.   Electronically Signed   By: Garald Balding M.D.   On: 07/11/2014 18:45   Dg Chest 2 View  07/11/2014   CLINICAL DATA:  Chest congestion, possible atrial fibrillation  EXAM: CHEST  2 VIEW  COMPARISON:  Chest x-ray of 11/22/2005  FINDINGS: No active infiltrate or effusion is seen. Moderate cardiomegaly is stable. Mediastinal and hilar contours are unremarkable. There are degenerative changes throughout  the thoracic spine.  IMPRESSION: Stable moderate cardiomegaly.  No active lung disease.   Electronically Signed   By: Ivar Drape M.D.   On: 07/11/2014 17:08   Ct Angio Chest Pe W/cm &/or Wo Cm  07/11/2014   CLINICAL DATA:  Subacute onset of shortness of breath and irregular heartbeat. Initial encounter.  EXAM: CT ANGIOGRAPHY CHEST WITH CONTRAST  TECHNIQUE: Multidetector CT imaging of the chest was performed using the standard protocol during bolus administration of intravenous contrast. Multiplanar CT image reconstructions  and MIPs were obtained to evaluate the vascular anatomy.  CONTRAST:  182mL OMNIPAQUE IOHEXOL 350 MG/ML SOLN  COMPARISON:  Chest radiograph performed earlier today at 6:30 p.m.  FINDINGS: There is no evidence of pulmonary embolus.  Trace bilateral pleural effusions are noted, right larger than left, with underlying interstitial prominence and mild hazy right-sided opacity. This likely reflects mild interstitial edema. There is no evidence of pneumothorax. There is question of a small 4 mm nodule within the right middle lobe (image 48 of 87), though this could also reflect underlying airspace opacity. No masses are identified; no abnormal focal contrast enhancement is seen.  Scattered coronary artery calcifications are seen. There is mild ectasia of the aortic root, measuring up to 4.2 cm, without aneurysmal dilatation. No pericardial effusion is identified. No mediastinal lymphadenopathy is seen. The great vessels are grossly unremarkable, aside from mild scattered calcification at the origin of the great vessels. No axillary lymphadenopathy is seen. The visualized portions of the thyroid gland are unremarkable in appearance.  Scattered hepatic cysts are seen, measuring up to 5.5 cm in size. The visualized portions of the liver and the spleen are otherwise unremarkable. The visualized portions of the gallbladder, pancreas and right adrenal gland are within normal limits. There is a 2.3 cm mildly heterogeneous mass within the left adrenal gland, which contains a focus of decreased attenuation, compatible with an adrenal adenoma. A 3 mm nonobstructing stone is noted at the upper pole of the left kidney.  No acute osseous abnormalities are seen. Anterior bridging osteophytes are seen along the lower thoracic spine.  Review of the MIP images confirms the above findings.  IMPRESSION: 1. No evidence of pulmonary embolus. 2. Trace bilateral pleural effusions, right larger than left, with underlying interstitial prominence  and mild hazy right-sided airspace opacity. This likely reflects mild interstitial edema. 3. Scattered coronary artery calcification noted. 4. Mild ectasia of the aortic root, measuring up to 4.2 cm, without aneurysmal dilatation. 5. Scattered hepatic cysts noted. 6. 2.3 cm mildly heterogeneous adrenal adenoma noted at the left adrenal gland. 7. 3 mm nonobstructing stone at the upper pole of the left kidney. 8. Question of small 4 mm nodule in the right middle lung lobe. If the patient is at high risk for bronchogenic carcinoma, follow-up chest CT at 1 year is recommended. If the patient is at low risk, no follow-up is needed. This recommendation follows the consensus statement: Guidelines for Management of Small Pulmonary Nodules Detected on CT Scans: A Statement from the [place] as published in Radiology 2005; 237:395-400.   Electronically Signed   By: Garald Balding M.D.   On: 07/11/2014 20:25      ASSESSMENT: Severe aortic insufficiency, acute systolic heart failure  PLAN:  Plan for TEE and right and left heart cath today. All questions have been answered. Coronary calcifications noted on his CT scan.  Currently on oral Lasix and low-dose carvedilol for his systolic heart failure. Consider adding ACE inhibitor as blood  pressure allows. Since he will be receiving a dye load today in the setting of recent diuresis, will consider starting ACE inhibitor after renal function has been stable.  Lung nodule: He will need a repeat chest CT in a year versus no follow-up depending on his risk.    Jettie Booze, MD  07/15/2014  8:38 AM

## 2014-07-15 NOTE — CV Procedure (Signed)
Donald Escobar. is a 59 y.o. male    [id number] LOCATION:  FACILITY: [facility]  PHYSICIAN: Quay Burow, M.D. 06-26-1955   DATE OF PROCEDURE:  07/15/2014  DATE OF DISCHARGE:     CARDIAC CATHETERIZATION     History obtained from chart review.Donald Escobar is a 59 year old mildly overweight Caucasian male who is admitted with congestive heart failure on 07/11/14. He ruled out for myocardial infarction. A 2-D echocardiogram revealed moderately severe LV dysfunction with a globally dilated LV and EF of 25%. He also had moderate to severe aortic insufficiency. He presents now for right and left heart catheter findings anatomy and physiology.   PROCEDURE DESCRIPTION:   The patient was brought to the second floor [facility] cath lab in the postabsorptive state. He was not premedicated . His right groinwas prepped and shaved in usual sterile fashion. Xylocaine 1% was used  for local anesthesia. A 5 French sheath was inserted into the right common femoral artery using standard Seldinger technique. A 7 French sheath was inserted into the right common femoral vein. A 7 French balloon tipped the midportion Swan-Ganz catheter was then advanced through the right heart chamber isn't taking sequential pressures. Pulmonary artery and femoral artery blood samples were obtained for Fick cardiac output determination. Thermodilution cardiac Also performed. Visipaque dye was used for the entirety of the case. Retrograde aortic, left ventricular and pulmonary pressures were recorded.     HEMODYNAMICS:    AO SYSTOLIC/AO DIASTOLIC: 532/02   LV SYSTOLIC/LV DIASTOLIC: 334/35  ANGIOGRAPHIC RESULTS:   1. Left main; normal  2. LAD; 90% proximal at the takeoff of a moderate-sized diagonal branch 3. Left circumflex; dominant and normal.  4. Right coronary artery; nondominant and normal 5.LIMA was subselectively visualized was widely patent. It was suitable for use during coronary artery bypass grafting 7.  Left ventriculography;was not performed today. 8. Supravalvular aortogram was performed using 20 mL of Visipaque dye at 20 mL/s. The descending thoracic aorta was mildly dilated. There was 2+ angiographic aortic insufficiency.  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 I'm going to get a CT entrance ramp measure his descending thoracic aorta. We will get the cardiothoracic surgeons involved in his care to consult for potential aortic valve replacement plus or minus Bentall procedure and coronary artery bypass grafting.  [name] MD, [facility] 07/15/2014 12:17 PM

## 2014-07-15 NOTE — H&P (View-Only) (Signed)
Patient Name: Donald Escobar. Date of Encounter: 07/14/2014  Active Problems:   Elevated troponin   Acute CHF   Lung nodule < 6cm on CT   Adenoma of left adrenal gland   Length of Stay: 3  SUBJECTIVE  Markedly improved dyspnea. Echo shows at least moderate (possibly severe) aortic insufficiency, dilated LV and severely depressed LVEF. Echo (in echo PACS system, not in [place]) from 2007 showed moderate AI, normal LVEF and normal size LV.  CURRENT MEDS . aspirin  324 mg Oral Once  . aspirin EC  81 mg Oral Daily  . carvedilol  3.125 mg Oral BID WC  . furosemide  40 mg Oral Daily  . sodium chloride  3 mL Intravenous Q12H    OBJECTIVE   Intake/Output Summary (Last 24 hours) at 07/14/14 1016 Last data filed at 07/14/14 0814  Gross per 24 hour  Intake    940 ml  Output   1650 ml  Net   -710 ml   Filed Weights   07/12/14 0648 07/13/14 0629 07/14/14 0507  Weight: 243 lb 6.2 oz (110.4 kg) 243 lb 14.4 oz (110.632 kg) 242 lb 1.6 oz (109.816 kg)    PHYSICAL EXAM Filed Vitals:   07/13/14 0629 07/13/14 1438 07/13/14 2054 07/14/14 0507  BP: 146/79 125/70 119/67 128/76  Pulse: 88 85 87 85  Temp: 97.8 F (36.6 C) 97.5 F (36.4 C) 98.1 F (36.7 C) 97.9 F (36.6 C)  TempSrc: Oral Oral Oral Oral  Resp: 18 20 18 18   Height:      Weight: 243 lb 14.4 oz (110.632 kg)   242 lb 1.6 oz (109.816 kg)  SpO2: 98% 98% 95% 98%   General: Alert, oriented x3, no distress Head: no evidence of trauma, PERRL, EOMI, no exophtalmos or lid lag, no myxedema, no xanthelasma; normal ears, nose and oropharynx Neck: normal jugular venous pulsations and no hepatojugular reflux; brisk carotid pulses without delay and no carotid bruits Chest: clear to auscultation, no signs of consolidation by percussion or palpation, normal fremitus, symmetrical and full respiratory excursions Cardiovascular: normal position and quality of the apical impulse, regular rhythm, normal first and second heart sounds, no rubs  or gallops, 2/6 systolic LSB ejection murmur, 1/6 diastolic decrescendo murmur Abdomen: no tenderness or distention, no masses by palpation, no abnormal pulsatility or arterial bruits, normal bowel sounds, no hepatosplenomegaly Extremities: no clubbing, cyanosis or edema; 2+ radial, ulnar and brachial pulses bilaterally; 2+ right femoral, posterior tibial and dorsalis pedis pulses; 2+ left femoral, posterior tibial and dorsalis pedis pulses; no subclavian or femoral bruits Neurological: grossly nonfocal  LABS  CBC  Recent Labs  07/11/14 1749  WBC 9.3  HGB 14.9  HCT 43.8  MCV 90.3  PLT 263   Basic Metabolic Panel  Recent Labs  07/13/14 0532 07/14/14 0329  NA 135 137  K 3.9 4.4  CL 103 103  CO2 25 27  GLUCOSE 93 100*  BUN 18 14  CREATININE 1.01 1.09  CALCIUM 8.9 9.2   Liver Function Tests  Recent Labs  07/12/14 0107  AST 35  ALT 40  ALKPHOS 73  BILITOT 1.3*  PROT 7.4  ALBUMIN 4.3   No results for input(s): LIPASE, AMYLASE in the last 72 hours. Cardiac Enzymes  Recent Labs  07/12/14 0107 07/12/14 0434 07/12/14 1040  TROPONINI 0.16* 0.14* 0.13*   BNP Invalid input(s): POCBNP D-Dimer  Recent Labs  07/11/14 1829  DDIMER 1.44*   Hemoglobin A1C  Recent Labs  07/12/14  0107  HGBA1C 5.9*   Fasting Lipid Panel  Recent Labs  07/12/14 0107  CHOL 161  HDL 33*  LDLCALC 110*  TRIG 89  CHOLHDL 4.9   Thyroid Function Tests  Recent Labs  07/12/14 0107  TSH 3.376    Radiology Studies Imaging results have been reviewed and No results found.  TELE NSR   ASSESSMENT AND PLAN  Evidence suggests primary aortic insufficiency with secondary LV dilation and dysfunction. The aortic valve is trileaflet, with thickened, slightly deformed valves, but the exact mechanism of insufficiency is unclear. Recommend TEE.  Will also need right and left heart cath, probably eventually AVR. Risks/benefits of TEE and heart cath discussed in detail. He agrees to  proceed.  Sanda Klein, MD, [facility] [phone] office [phone] pager 07/14/2014 10:16 AM

## 2014-07-15 NOTE — CV Procedure (Signed)
    Transesophageal Echocardiogram Note  Prynce Jacober [id number] 1956-02-27  Procedure: Transesophageal Echocardiogram Indications: aortic insufficiency  Procedure Details Consent: Obtained Time Out: Verified patient identification, verified procedure, site/side was marked, verified correct patient position, special equipment/implants available, Radiology Safety Procedures followed,  medications/allergies/relevent history reviewed, required imaging and test results available.  Performed  Medications: Fentanyl: 75 mcg iv  Versed:  4 mg iv   Left Ventrical:  Moderate LV dysfunction   Mitral Valve: normal , trivial MR.  The opening of the MV is limited by the AI jet   Aortic Valve: calcified.  The right and left cusps are partially fused and the noncoronary cups is immobile. Functionally a bicuspid valve. No vegetation  Tricuspid Valve: trivial TR  Pulmonic Valve: no significand PI  Left Atrium/ Left atrial appendage: no thrombi   Atrial septum: no PFO by color   Aorta: normal.    Complications: No apparent complications Patient did tolerate procedure well.   Thayer Headings, Brooke Bonito., MD, [facility] 07/15/2014, 11:02 AM

## 2014-07-15 NOTE — Progress Notes (Signed)
  Echocardiogram Echocardiogram Transesophageal has been performed.  Philipp Deputy 07/15/2014, 11:32 AM

## 2014-07-15 NOTE — Interval H&P Note (Signed)
History and Physical Interval Note:  07/15/2014 10:33 AM  Donald Escobar.  has presented today for surgery, with the diagnosis of aortic valve  The various methods of treatment have been discussed with the patient and family. After consideration of risks, benefits and other options for treatment, the patient has consented to  Procedure(s): TRANSESOPHAGEAL ECHOCARDIOGRAM (TEE) (N/A) as a surgical intervention .  The patient's history has been reviewed, patient examined, no change in status, stable for surgery.  I have reviewed the patient's chart and labs.  Questions were answered to the patient's satisfaction.     Nahser, [name]

## 2014-07-15 NOTE — Progress Notes (Signed)
SUBJECTIVE:  Patient feels well. Breathing much better after diuresis  OBJECTIVE:   Vitals:   Filed Vitals:   07/14/14 0507 07/14/14 1345 07/14/14 2028 07/15/14 0542  BP: 128/76 130/65 130/63 127/58  Pulse: 85 79 83 80  Temp: 97.9 F (36.6 C) 97.4 F (36.3 C)  98 F (36.7 C)  TempSrc: Oral Oral  Oral  Resp: 18 20 20 18   Height:      Weight: 242 lb 1.6 oz (109.816 kg)   243 lb 11.2 oz (110.542 kg)  SpO2: 98% 97% 96% 99%   I&O's:   Intake/Output Summary (Last 24 hours) at 07/15/14 0092 Last data filed at 07/15/14 3300  Gross per 24 hour  Intake 403.17 ml  Output   1950 ml  Net -1546.83 ml   TELEMETRY: Reviewed telemetry pt in normal sinus rhythm:     PHYSICAL EXAM General: Well developed, well nourished, in no acute distress Head:   Normal cephalic and atramatic  Lungs:   Clear bilaterally to auscultation. Heart:   HRRR S1 S2  No JVD.  2/6 systolic murmur, 2/6 diastolic murmur Abdomen: abdomen soft and non-tender Msk:  Back normal,  Normal strength and tone for age. Extremities: No lower extremity edema.   Neuro: Alert and oriented. Psych:  Normal affect, responds appropriately Skin: No rash   LABS: Basic Metabolic Panel:  Recent Labs  07/13/14 0532 07/14/14 0329  NA 135 137  K 3.9 4.4  CL 103 103  CO2 25 27  GLUCOSE 93 100*  BUN 18 14  CREATININE 1.01 1.09  CALCIUM 8.9 9.2   Liver Function Tests: No results for input(s): AST, ALT, ALKPHOS, BILITOT, PROT, ALBUMIN in the last 72 hours. No results for input(s): LIPASE, AMYLASE in the last 72 hours. CBC: No results for input(s): WBC, NEUTROABS, HGB, HCT, MCV, PLT in the last 72 hours. Cardiac Enzymes:  Recent Labs  07/12/14 1040  TROPONINI 0.13*   BNP: Invalid input(s): POCBNP D-Dimer: No results for input(s): DDIMER in the last 72 hours. Hemoglobin A1C: No results for input(s): HGBA1C in the last 72 hours. Fasting Lipid Panel: No results for input(s): CHOL, HDL, LDLCALC, TRIG, CHOLHDL,  LDLDIRECT in the last 72 hours. Thyroid Function Tests: No results for input(s): TSH, T4TOTAL, T3FREE, THYROIDAB in the last 72 hours.  Invalid input(s): FREET3 Anemia Panel: No results for input(s): VITAMINB12, FOLATE, FERRITIN, TIBC, IRON, RETICCTPCT in the last 72 hours. Coag Panel:   Lab Results  Component Value Date   INR 1.10 07/15/2014    RADIOLOGY: Dg Chest 2 View  07/11/2014   CLINICAL DATA:  Acute onset of atrial fibrillation. Weakness. Initial encounter.  EXAM: CHEST  2 VIEW  COMPARISON:  Chest radiograph performed earlier today at 3:26 p.m.  FINDINGS: There is new vascular congestion and increased interstitial markings, concerning for mild interstitial edema. No pleural effusion or pneumothorax is seen. The lungs are well expanded.  The cardiomediastinal silhouette is mildly enlarged. No acute osseous abnormalities are identified.  IMPRESSION: New vascular congestion, with mild cardiomegaly, and increased interstitial markings, concerning for mild interstitial edema.   Electronically Signed   By: Garald Balding M.D.   On: 07/11/2014 18:45   Dg Chest 2 View  07/11/2014   CLINICAL DATA:  Chest congestion, possible atrial fibrillation  EXAM: CHEST  2 VIEW  COMPARISON:  Chest x-ray of 11/22/2005  FINDINGS: No active infiltrate or effusion is seen. Moderate cardiomegaly is stable. Mediastinal and hilar contours are unremarkable. There are degenerative changes throughout  the thoracic spine.  IMPRESSION: Stable moderate cardiomegaly.  No active lung disease.   Electronically Signed   By: Ivar Drape M.D.   On: 07/11/2014 17:08   Ct Angio Chest Pe W/cm &/or Wo Cm  07/11/2014   CLINICAL DATA:  Subacute onset of shortness of breath and irregular heartbeat. Initial encounter.  EXAM: CT ANGIOGRAPHY CHEST WITH CONTRAST  TECHNIQUE: Multidetector CT imaging of the chest was performed using the standard protocol during bolus administration of intravenous contrast. Multiplanar CT image reconstructions  and MIPs were obtained to evaluate the vascular anatomy.  CONTRAST:  142mL OMNIPAQUE IOHEXOL 350 MG/ML SOLN  COMPARISON:  Chest radiograph performed earlier today at 6:30 p.m.  FINDINGS: There is no evidence of pulmonary embolus.  Trace bilateral pleural effusions are noted, right larger than left, with underlying interstitial prominence and mild hazy right-sided opacity. This likely reflects mild interstitial edema. There is no evidence of pneumothorax. There is question of a small 4 mm nodule within the right middle lobe (image 48 of 87), though this could also reflect underlying airspace opacity. No masses are identified; no abnormal focal contrast enhancement is seen.  Scattered coronary artery calcifications are seen. There is mild ectasia of the aortic root, measuring up to 4.2 cm, without aneurysmal dilatation. No pericardial effusion is identified. No mediastinal lymphadenopathy is seen. The great vessels are grossly unremarkable, aside from mild scattered calcification at the origin of the great vessels. No axillary lymphadenopathy is seen. The visualized portions of the thyroid gland are unremarkable in appearance.  Scattered hepatic cysts are seen, measuring up to 5.5 cm in size. The visualized portions of the liver and the spleen are otherwise unremarkable. The visualized portions of the gallbladder, pancreas and right adrenal gland are within normal limits. There is a 2.3 cm mildly heterogeneous mass within the left adrenal gland, which contains a focus of decreased attenuation, compatible with an adrenal adenoma. A 3 mm nonobstructing stone is noted at the upper pole of the left kidney.  No acute osseous abnormalities are seen. Anterior bridging osteophytes are seen along the lower thoracic spine.  Review of the MIP images confirms the above findings.  IMPRESSION: 1. No evidence of pulmonary embolus. 2. Trace bilateral pleural effusions, right larger than left, with underlying interstitial prominence  and mild hazy right-sided airspace opacity. This likely reflects mild interstitial edema. 3. Scattered coronary artery calcification noted. 4. Mild ectasia of the aortic root, measuring up to 4.2 cm, without aneurysmal dilatation. 5. Scattered hepatic cysts noted. 6. 2.3 cm mildly heterogeneous adrenal adenoma noted at the left adrenal gland. 7. 3 mm nonobstructing stone at the upper pole of the left kidney. 8. Question of small 4 mm nodule in the right middle lung lobe. If the patient is at high risk for bronchogenic carcinoma, follow-up chest CT at 1 year is recommended. If the patient is at low risk, no follow-up is needed. This recommendation follows the consensus statement: Guidelines for Management of Small Pulmonary Nodules Detected on CT Scans: A Statement from the [place] as published in Radiology 2005; 237:395-400.   Electronically Signed   By: Garald Balding M.D.   On: 07/11/2014 20:25      ASSESSMENT: Severe aortic insufficiency, acute systolic heart failure  PLAN:  Plan for TEE and right and left heart cath today. All questions have been answered. Coronary calcifications noted on his CT scan.  Currently on oral Lasix and low-dose carvedilol for his systolic heart failure. Consider adding ACE inhibitor as blood  pressure allows. Since he will be receiving a dye load today in the setting of recent diuresis, will consider starting ACE inhibitor after renal function has been stable.  Lung nodule: He will need a repeat chest CT in a year versus no follow-up depending on his risk.    Jettie Booze, MD  07/15/2014  8:38 AM

## 2014-07-15 NOTE — Interval H&P Note (Signed)
Cath Lab Visit (complete for each Cath Lab visit)  Clinical Evaluation Leading to the Procedure:   ACS: No.  Non-ACS:    Anginal Classification: No Symptoms  Anti-ischemic medical therapy: No Therapy  Non-Invasive Test Results: No non-invasive testing performed  Prior CABG: No previous CABG      History and Physical Interval Note:  07/15/2014 11:23 AM  Donald Escobar.  has presented today for surgery, with the diagnosis of valve disease  The various methods of treatment have been discussed with the patient and family. After consideration of risks, benefits and other options for treatment, the patient has consented to  Procedure(s): LEFT AND RIGHT HEART CATHETERIZATION WITH CORONARY ANGIOGRAM (N/A) as a surgical intervention .  The patient's history has been reviewed, patient examined, no change in status, stable for surgery.  I have reviewed the patient's chart and labs.  Questions were answered to the patient's satisfaction.     [name]

## 2014-07-16 ENCOUNTER — Inpatient Hospital Stay (HOSPITAL_COMMUNITY): Payer: BLUE CROSS/BLUE SHIELD

## 2014-07-16 ENCOUNTER — Encounter (HOSPITAL_COMMUNITY): Payer: Self-pay | Admitting: Cardiovascular Disease

## 2014-07-16 DIAGNOSIS — R9389 Abnormal findings on diagnostic imaging of other specified body structures: Secondary | ICD-10-CM | POA: Diagnosis present

## 2014-07-16 DIAGNOSIS — I2510 Atherosclerotic heart disease of native coronary artery without angina pectoris: Secondary | ICD-10-CM | POA: Diagnosis present

## 2014-07-16 DIAGNOSIS — C8590 Non-Hodgkin lymphoma, unspecified, unspecified site: Secondary | ICD-10-CM | POA: Insufficient documentation

## 2014-07-16 DIAGNOSIS — I10 Essential (primary) hypertension: Secondary | ICD-10-CM

## 2014-07-16 DIAGNOSIS — R002 Palpitations: Secondary | ICD-10-CM | POA: Diagnosis present

## 2014-07-16 DIAGNOSIS — Z01810 Encounter for preprocedural cardiovascular examination: Secondary | ICD-10-CM

## 2014-07-16 DIAGNOSIS — I25119 Atherosclerotic heart disease of native coronary artery with unspecified angina pectoris: Secondary | ICD-10-CM

## 2014-07-16 DIAGNOSIS — E785 Hyperlipidemia, unspecified: Secondary | ICD-10-CM | POA: Diagnosis present

## 2014-07-16 DIAGNOSIS — J45909 Unspecified asthma, uncomplicated: Secondary | ICD-10-CM | POA: Diagnosis present

## 2014-07-16 LAB — PULMONARY FUNCTION TEST
DL/VA % PRED: 108 %
DL/VA: 4.64 ml/min/mmHg/L
DLCO COR % PRED: 80 %
DLCO COR: 20.73 ml/min/mmHg
DLCO UNC % PRED: 81 %
DLCO UNC: 20.9 ml/min/mmHg
FEF 25-75 Post: 2.19 L/sec
FEF 25-75 Pre: 2.83 L/sec
FEF2575-%Change-Post: -22 %
FEF2575-%PRED-POST: 87 %
FEF2575-%Pred-Pre: 113 %
FEV1-%CHANGE-POST: -5 %
FEV1-%PRED-POST: 79 %
FEV1-%PRED-PRE: 84 %
FEV1-POST: 2.38 L
FEV1-Pre: 2.52 L
FEV1FVC-%CHANGE-POST: -9 %
FEV1FVC-%Pred-Pre: 108 %
FEV6-%CHANGE-POST: 3 %
FEV6-%PRED-PRE: 81 %
FEV6-%Pred-Post: 83 %
FEV6-PRE: 3.04 L
FEV6-Post: 3.15 L
FEV6FVC-%CHANGE-POST: 0 %
FEV6FVC-%PRED-POST: 104 %
FEV6FVC-%Pred-Pre: 105 %
FVC-%Change-Post: 3 %
FVC-%PRED-PRE: 77 %
FVC-%Pred-Post: 80 %
FVC-POST: 3.15 L
FVC-PRE: 3.04 L
POST FEV6/FVC RATIO: 100 %
PRE FEV1/FVC RATIO: 83 %
PRE FEV6/FVC RATIO: 100 %
Post FEV1/FVC ratio: 75 %
RV % pred: 128 %
RV: 2.54 L
TLC % pred: 91 %
TLC: 5.47 L

## 2014-07-16 MED ORDER — CARVEDILOL 3.125 MG PO TABS: 3.1250 mg | ORAL_TABLET | Freq: Two times a day (BID) | ORAL | Status: DC

## 2014-07-16 MED ORDER — ATORVASTATIN CALCIUM 40 MG PO TABS
40.0000 mg | ORAL_TABLET | Freq: Every day | ORAL | Status: DC
  Filled 2014-07-16: qty 1

## 2014-07-16 MED ORDER — NITROGLYCERIN 0.4 MG SL SUBL: 0.4000 mg | SUBLINGUAL_TABLET | SUBLINGUAL | Status: DC | PRN

## 2014-07-16 MED ORDER — ATORVASTATIN CALCIUM 40 MG PO TABS: 40.0000 mg | ORAL_TABLET | Freq: Every evening | ORAL | Status: DC

## 2014-07-16 MED ORDER — ALBUTEROL SULFATE (2.5 MG/3ML) 0.083% IN NEBU
2.5000 mg | INHALATION_SOLUTION | Freq: Once | RESPIRATORY_TRACT | Status: AC
  Administered 2014-07-16: 2.5 mg via RESPIRATORY_TRACT

## 2014-07-16 MED ORDER — ASPIRIN 81 MG PO TBEC: 81.0000 mg | DELAYED_RELEASE_TABLET | Freq: Every day | ORAL | Status: AC

## 2014-07-16 MED ORDER — IOHEXOL 350 MG/ML SOLN
100.0000 mL | Freq: Once | INTRAVENOUS | Status: AC | PRN
  Administered 2014-07-16: 100 mL via INTRAVENOUS

## 2014-07-16 MED ORDER — FUROSEMIDE 40 MG PO TABS: 40.0000 mg | ORAL_TABLET | Freq: Every day | ORAL | Status: DC

## 2014-07-16 NOTE — Progress Notes (Signed)
CARDIAC REHAB PHASE I   PRE:  Rate/Rhythm: 90 SR  BP:  Supine:   Sitting: 110/60  Standing:    SaO2: 98 RA  MODE:  Ambulation: 760 ft   POST:  Rate/Rhythm: 90  BP:  Supine:   Sitting: 120/70  Standing:    SaO2: 99 RA 1420-1515 Pt tolerated ambulation well, only c/o was of right groin and thigh tender since cardiac cath. He was able to walk 760 feet without c/o of cp or SOB. VS stable Pt back to recline after walk with call light in reach. Completed pre-op education with pt. I instructed pt in use of IS and directed him to bring it back to hospital when he has surgery. I gave him surgery education booklet and pt care guide. We discussed sternal precautions, use of IS and walking post-op.He does live alone and is not sure that he will be able to get some on to stay with him or have someone he can stay with 24/7 at discharge. He is open to a rehab stay. We will follow pt post-op as ordered.  Rodney Langton RN 07/16/2014 3:11 PM

## 2014-07-16 NOTE — Discharge Summary (Signed)
Discharge Summary   Patient ID: Donald Escobar. MRN: [id number], DOB/AGE: 1955-09-21 59 y.o. Admit date: 07/11/2014 D/C date:     07/16/2014  Primary Care Provider: No PCP Per Patient - follows with [facility] (pt encouraged to select one physician as his PCP) Primary Cardiologist: Dr. Gwenlyn Found (new to him this admission)  Primary Discharge Diagnoses:  1. Acute systolic CHF - 2D Echo 4/94/49 - EF 25-30%, severe diffuse HK with WMA< grade 2 DD, mild AS, mod-severe AI - TEE 07/15/14 - EF 40-45%, severe AI 2. Moderately severe aortic insufficiency 2. CAD with NSTEMI - peak troponin 0.16 - cath 07/15/14: 90% prox LAD diagonal branch bifurcation disease - possible nerve irritation post-cath expected to gradually resolve 3. Palpitations  - telemetry significant for 2 episodes of arrhythmia: one brief irregular wide complex tachycardia (question AF/NSVT - but slow for VT), and one brief narrow complex irregular tachycardia with P wave activity (question PAT) 4. HTN 5. Incidental findings on CTA which will need follow-up per primary care - 56mm R middle lobe lung nodule noted on CTA, heterogeneous adrenal adenoma noted at L adrenal gland, 75mm nonobstructing stone in upper pole of L kidney, scattered hepatic cysts 6. Hyperlipidemia - LDL 110 7. Morbid obesity Body mass index is 40.44 kg/(m^2).  Secondary Discharge Diagnoses:  1. Asthma 2. H/o Non-Hodgkin's lymphoma tx with anthracycline   Outstanding Results: PFTs/CT chest aorta ordered by Dr. Coral Spikes Course:  [name] is a 59 y/o with history of HTN, asthma, & non-Hodgkins lymphoma who presented to [facility] 07/11/2014 with SOB and palpitations. He reported that over the last couple of weeks he has felt his heart beating irregularly, fast at times, occuring daily. Episodes last up to 20 minutes but vary.He also reported worsening DOE. He began noticing that he couldn't even climb a flight of stairs. He  reports having a normal childhood, but in his teenage years had difficulty keeping up with others due to DOE thought to be asthma. He has not had any CP. He saw the NP at work who recommended further evaluation.   He presented to the ER where EKG showed sinus tach with frequent PACs, LVH with repol abnormality. CXR showed new vascular congestion, mild cardiomegaly, increased interstitial markings, concerning for mild interstitial edema. CT angio to exclude PE (due to elevated d-dimer) showed: 1. No evidence of pulmonary embolus. 2. Trace bilateral pleural effusions, right larger than left, with underlying interstitial prominence and mild hazy right-sided airspace opacity. This likely reflects mild interstitial edema. 3. Scattered coronary artery calcification noted. 4. Mild ectasia of the aortic root, measuring up to 4.2 cm, without aneurysmal dilatation. 5. Scattered hepatic cysts noted. 6. 2.3 cm mildly heterogeneous adrenal adenoma noted at the left adrenal gland. 7. 3 mm nonobstructing stone at the upper pole of the left kidney.  8. Question of small 4 mm nodule in the right middle lung lobe. If the patient is at high risk for bronchogenic carcinoma, follow-up chest CT at 1 year is recommended. If the patient is at low risk, no follow-up is needed. This recommendation follows the consensus statement: Guidelines for Management of Small Pulmonary Nodules Detected on CT Scans: A Statement from the [employer] as published in Radiology 2005; 237:395-400. He was advised to f/u with PCP for the various findings of kidney stone, adrenal adenoma, hepatic cysts and RML nodule. BNP was 519. His dyspnea was felt consistent with acute heart failure. He was placed on  IV diuretics and BB. He ruled in with positive troponins - peak 0.18. Subsequent studies during this admission showed:  2D Echo 07/13/14 - EF 25-30%, severe diffuse HK with possible disproportionatelysevere hypokinesis of the anteroseptal and  anterior myocardium, grade 2 DD, mild AS, mod-severe AI. (Of note, moderate AI was present in 2007 echo.)   TEE 07/15/14:  Left Ventrical: Moderate LV dysfunction  Mitral Valve: normal , trivial MR. The opening of the MV is limited by the AI jet  Aortic Valve: calcified. The right and left cusps are partially fused and the noncoronary cups is immobile. Functionally a bicuspid valve. No vegetation Tricuspid Valve: trivial TR Pulmonic Valve: no significand PI Left Atrium/ Left atrial appendage: no thrombi  Atrial septum: no PFO by color  Aorta: normal.  Cardiac cath on 07/15/14 showed: 1. Left main; normal  2. LAD; 90% proximal at the takeoff of a moderate-sized diagonal branch 3. Left circumflex; dominant and normal.  4. Right coronary artery; nondominant and normal 5.LIMA was subselectively visualized was widely patent. It was suitable for use during coronary artery bypass grafting 7. Left ventriculography;was not performed today. 8. Supravalvular aortogram was performed using 20 mL of Visipaque dye at 20 mL/s. The descending thoracic aorta was mildly dilated. There was 2+ angiographic aortic insufficiency.  Surgical consultation was obtained. Dr. Roxan Hockey felt he would benefit symptomatically and have improved survival with AVR, CABG with grafts to the LAD and diagonal. Continued inpatient management was offered to the patient but he declined, citing that he needed to go home and take care of "some things" before having it done. Dr. Irish Lack was agreeable to this. The patient was given standard NSTEMI post-discharge instructions. He did c/o some thigh irritation post-cath which is felt to be nerve irritation - Dr. Irish Lack is hopeful for continued improvement with time and warm compresses. The surgical team will contact the patient with further instructions regarding surgery next week. He recommends to continue ASA, BB, Lasix for now. He did not require K+ repletion. Given BPs that run in  the 110-120, Dr. Irish Lack held off on ACEI at this time but we can consider starting in the future. Statin was added given LDL 110 and CAD. Regarding palpitations - yesterday he had 25 beats of a wider complex irregular tachycardia, and a shorter run of narrow complex irregular tachycardia - but the latter had more obvious P wave activity. It is not clear that he was symptomatic with this as it was during sleeping hours. This was quiescent on BB. Per Dr. Irish Lack will continue to observe. If his palpitations come to represent AF he would not be able to be anticoagulated in the upcoming days due to pending surgery. He will need to be monitored post-surgery for this.  Recommend arrangement of cardiology follow-up after his upcoming surgery, timing to be determined. Would consider outpatient f/u lipids/LFTs at time of eventual post-op cardiology follow-up. Dr. Irish Lack has seen and examined the patient today and feels he is stable for discharge. Work note was provided - the patient requested it include some details on his hospital stay to validate to his employer the gravity of his admission.   - - - - -  Discharge Exam:  Vital Signs. BP 126/65 mmHg  Pulse 86  Temp(Src) 98.4 F (36.9 C) (Oral)  Resp 16  Ht 5\' 5"  (1.651 m)  Wt 243 lb (110.224 kg)  BMI 40.44 kg/m2  SpO2 97% General: Well developed, well nourished WM in no acute distress. Head: Normocephalic, atraumatic,  sclera non-icteric, no xanthomas, nares are without discharge. Neck: Negative for carotid bruits. JVP not elevated. Lungs: Clear bilaterally to auscultation without wheezes, rales, or rhonchi. Breathing is unlabored. Heart: RRR S1 S2 with 2/6 systolic and 2/6 diastolic murmur. Abdomen: Soft, non-tender, non-distended with normoactive bowel sounds. No rebound/guarding. Extremities: No clubbing or cyanosis. No edema. Distal pedal pulses are 2+ and equal bilaterally. Right groin site without hematoma or ecchymosis. Neuro: Alert and  oriented X 3. Moves all extremities spontaneously. Psych:  Responds to questions appropriately with a normal affect.  Labs: Lab Results  Component Value Date   WBC 9.3 07/11/2014   HGB 14.9 07/11/2014   HCT 43.8 07/11/2014   MCV 90.3 07/11/2014   PLT 230 07/11/2014    Recent Labs Lab 07/12/14 0107  07/14/14 0329  NA  --   < > 137  K  --   < > 4.4  CL  --   < > 103  CO2  --   < > 27  BUN  --   < > 14  CREATININE  --   < > 1.09  CALCIUM  --   < > 9.2  PROT 7.4  --   --   BILITOT 1.3*  --   --   ALKPHOS 73  --   --   ALT 40  --   --   AST 35  --   --   GLUCOSE  --   < > 100*  < > = values in this interval not displayed.  Lab Results  Component Value Date   CHOL 161 07/12/2014   HDL 33* 07/12/2014   LDLCALC 110* 07/12/2014   TRIG 89 07/12/2014   Diagnostic Studies/Procedures   Dg Chest 2 View  07/11/2014   CLINICAL DATA:  Acute onset of atrial fibrillation. Weakness. Initial encounter.  EXAM: CHEST  2 VIEW  COMPARISON:  Chest radiograph performed earlier today at 3:26 p.m.  FINDINGS: There is new vascular congestion and increased interstitial markings, concerning for mild interstitial edema. No pleural effusion or pneumothorax is seen. The lungs are well expanded.  The cardiomediastinal silhouette is mildly enlarged. No acute osseous abnormalities are identified.  IMPRESSION: New vascular congestion, with mild cardiomegaly, and increased interstitial markings, concerning for mild interstitial edema.   Electronically Signed   By: Garald Balding M.D.   On: 07/11/2014 18:45   Dg Chest 2 View  07/11/2014   CLINICAL DATA:  Chest congestion, possible atrial fibrillation  EXAM: CHEST  2 VIEW  COMPARISON:  Chest x-ray of 11/22/2005  FINDINGS: No active infiltrate or effusion is seen. Moderate cardiomegaly is stable. Mediastinal and hilar contours are unremarkable. There are degenerative changes throughout the thoracic spine.  IMPRESSION: Stable moderate cardiomegaly.  No active lung  disease.   Electronically Signed   By: Ivar Drape M.D.   On: 07/11/2014 17:08   Ct Angio Chest Pe W/cm &/or Wo Cm  07/11/2014   CLINICAL DATA:  Subacute onset of shortness of breath and irregular heartbeat. Initial encounter.  EXAM: CT ANGIOGRAPHY CHEST WITH CONTRAST  TECHNIQUE: Multidetector CT imaging of the chest was performed using the standard protocol during bolus administration of intravenous contrast. Multiplanar CT image reconstructions and MIPs were obtained to evaluate the vascular anatomy.  CONTRAST:  168mL OMNIPAQUE IOHEXOL 350 MG/ML SOLN  COMPARISON:  Chest radiograph performed earlier today at 6:30 p.m.  FINDINGS: There is no evidence of pulmonary embolus.  Trace bilateral pleural effusions are noted, right larger than left,  with underlying interstitial prominence and mild hazy right-sided opacity. This likely reflects mild interstitial edema. There is no evidence of pneumothorax. There is question of a small 4 mm nodule within the right middle lobe (image 48 of 87), though this could also reflect underlying airspace opacity. No masses are identified; no abnormal focal contrast enhancement is seen.  Scattered coronary artery calcifications are seen. There is mild ectasia of the aortic root, measuring up to 4.2 cm, without aneurysmal dilatation. No pericardial effusion is identified. No mediastinal lymphadenopathy is seen. The great vessels are grossly unremarkable, aside from mild scattered calcification at the origin of the great vessels. No axillary lymphadenopathy is seen. The visualized portions of the thyroid gland are unremarkable in appearance.  Scattered hepatic cysts are seen, measuring up to 5.5 cm in size. The visualized portions of the liver and the spleen are otherwise unremarkable. The visualized portions of the gallbladder, pancreas and right adrenal gland are within normal limits. There is a 2.3 cm mildly heterogeneous mass within the left adrenal gland, which contains a focus of  decreased attenuation, compatible with an adrenal adenoma. A 3 mm nonobstructing stone is noted at the upper pole of the left kidney.  No acute osseous abnormalities are seen. Anterior bridging osteophytes are seen along the lower thoracic spine.  Review of the MIP images confirms the above findings.  IMPRESSION: 1. No evidence of pulmonary embolus. 2. Trace bilateral pleural effusions, right larger than left, with underlying interstitial prominence and mild hazy right-sided airspace opacity. This likely reflects mild interstitial edema. 3. Scattered coronary artery calcification noted. 4. Mild ectasia of the aortic root, measuring up to 4.2 cm, without aneurysmal dilatation. 5. Scattered hepatic cysts noted. 6. 2.3 cm mildly heterogeneous adrenal adenoma noted at the left adrenal gland. 7. 3 mm nonobstructing stone at the upper pole of the left kidney. 8. Question of small 4 mm nodule in the right middle lung lobe. If the patient is at high risk for bronchogenic carcinoma, follow-up chest CT at 1 year is recommended. If the patient is at low risk, no follow-up is needed. This recommendation follows the consensus statement: Guidelines for Management of Small Pulmonary Nodules Detected on CT Scans: A Statement from the [facility] as published in Radiology 2005; 237:395-400.   Electronically Signed   By: Garald Balding M.D.   On: 07/11/2014 20:25   Cath 07/15/14 History obtained from chart review.Mr. Antillon is a 59 year old mildly overweight Caucasian male who is admitted with congestive heart failure on 07/11/14. He ruled out for myocardial infarction. A 2-D echocardiogram revealed moderately severe LV dysfunction with a globally dilated LV and EF of 25%. He also had moderate to severe aortic insufficiency. He presents now for right and left heart catheter findings anatomy and physiology. PROCEDURE DESCRIPTION:  The patient was brought to the second floor [place] [facility] [facility] cath lab in the postabsorptive  state. He was not premedicated . His right groinwas prepped and shaved in usual sterile fashion. Xylocaine 1% was used  for local anesthesia. A 5 French sheath was inserted into the right common femoral artery using standard Seldinger technique. A 7 French sheath was inserted into the right common femoral vein. A 7 French balloon tipped the midportion Swan-Ganz catheter was then advanced through the right heart chamber isn't taking sequential pressures. Pulmonary artery and femoral artery blood samples were obtained for Fick cardiac output determination. Thermodilution cardiac Also performed. Visipaque dye was used for the entirety of the case. Retrograde aortic, left ventricular  and pulmonary pressures were recorded. HEMODYNAMICS:  AO SYSTOLIC/AO DIASTOLIC: 035/00 LV SYSTOLIC/LV DIASTOLIC: 938/18 ANGIOGRAPHIC RESULTS:  1. Left main; normal  2. LAD; 90% proximal at the takeoff of a moderate-sized diagonal branch 3. Left circumflex; dominant and normal.  4. Right coronary artery; nondominant and normal 5.LIMA was subselectively visualized was widely patent. It was suitable for use during coronary artery bypass grafting 7. Left ventriculography;was not performed today. 8. Supravalvular aortogram was performed using 20 mL of Visipaque dye at 20 mL/s. The descending thoracic aorta was mildly dilated. There was 2+ angiographic aortic insufficiency. IMPRESSION:Mr. Siebert has high-grade proximal LAD diagonal branch bifurcation disease, moderate-to-severe aortic insufficiency with moderate to severe LV dysfunction and a generous thoracic aorta. I'm going to get a CT entrance ramp measure his descending thoracic aorta. We will get the cardiothoracic surgeons involved in his care to consult for potential aortic valve replacement plus or minus Bentall procedure and coronary artery bypass grafting. [name] MD, [facility] 07/15/2014 12:17 PM  TEE 07/15/14 Study Conclusions - Left ventricle: Systolic  function was mildly to moderately reduced. The estimated ejection fraction was in the range of 40% to 45%. - Aortic valve: There was severe regurgitation. - Left atrium: There was spontaneous echo contrast (&quot;smoke&quot;).  2D cho 07/13/14 - Left ventricle: The cavity size was moderately dilated. Left ventricular geometry showed evidence of eccentric hypertrophy. Systolic function was severely reduced. The estimated ejection fraction was in the range of 25% to 30%. Severe diffuse hypokinesis with regional variations. Possible disproportionately severe hypokinesis of the anteroseptal and anterior myocardium. Features are consistent with a pseudonormal left ventricular filling pattern, with concomitant abnormal relaxation and increased filling pressure (grade 2 diastolic dysfunction). - Aortic valve: There was mild stenosis. There was moderate to severe regurgitation. Valve area (VTI): 2.16 cm^2. Valve area (Vmax): 1.5 cm^2. Valve area (Vmean): 1.65 cm^2. - Mitral valve: Calcified annulus. Mildly thickened leaflets . - Left atrium: The atrium was mildly dilated. - Pericardium, extracardiac: A trivial pericardial effusion was identified. Impressions: - Compared to echo report 10/2005, LVEF has diminished (previously normal) and AI is worse.  Discharge Medications   Current Discharge Medication List    START taking these medications   Details  aspirin EC 81 MG EC tablet Take 1 tablet (81 mg total) by mouth daily. Qty: 30 tablet, Refills: 6    atorvastatin (LIPITOR) 40 MG tablet Take 1 tablet (40 mg total) by mouth every evening. Qty: 30 tablet, Refills: 6    carvedilol (COREG) 3.125 MG tablet Take 1 tablet (3.125 mg total) by mouth 2 (two) times daily with a meal. Qty: 60 tablet, Refills: 6    furosemide (LASIX) 40 MG tablet Take 1 tablet (40 mg total) by mouth daily. Qty: 30 tablet, Refills: 3    nitroGLYCERIN (NITROSTAT) 0.4 MG SL tablet Place  1 tablet (0.4 mg total) under the tongue every 5 (five) minutes as needed for chest pain (up to 3 doses). Qty: 25 tablet, Refills: 3      CONTINUE these medications which have NOT CHANGED   Details  albuterol (PROVENTIL HFA;VENTOLIN HFA) 108 (90 BASE) MCG/ACT inhaler Inhale into the lungs every 6 (six) hours as needed for wheezing or shortness of breath.        Disposition   The patient will be discharged in stable condition to home. Discharge Instructions    Diet - low sodium heart healthy    Complete by:  As directed      Increase activity slowly  Complete by:  As directed   No driving for 1 week. No lifting over 10 lbs for 2 weeks. No sexual activity for 2 weeks. You may not return to work until cleared by your cardiologist. Keep procedure site clean & dry. If you notice increased pain, swelling, bleeding or pus, call/return!  You may shower, but no soaking baths/hot tubs/pools for 1 week. We are hopeful that your nerve irritation will continue to improve. Dr. Irish Lack wants you to try warm compresses.  Your CT scan incidentally showed other findings which might need to be followed. It is common to accidentally pick these things up on imaging. Please discuss the following findings with your primary care doctor: - possible 36mm nodule in the right middle lobe (may need repeat CT scan in 1 year) - 2.3cm adrenal gland enlargement (adenoma) - scattered liver cysts - 51mm kidney stone          Follow-up Information    Follow up with Melrose Nakayama, MD.   Specialty:  Cardiothoracic Surgery   Why:  Office will call you to arrange surgery. Call office if you have not heard back in 3 days.   Contact information:   [address] [place] [address] [phone]       Follow up with [name], MD.   Specialty:  Cardiology   Why:  Please call if you have any problems or questions. We anticipate having you follow up in our office after surgery.   Contact  information:   [address] [place] [address] [phone]       Follow up with Primary Care Provider.   Why:  See discharge instructions below.        Duration of Discharge Encounter: Greater than 30 minutes including physician and PA time.  Signed, Melina Copa PA-C 07/16/2014, 2:46 PM  I have examined the patient and reviewed assessment and plan and discussed with patient.  Agree with above as stated. Mild right thigh pain.  May be related to femoral nerve irritation. Should improve. No hematoma. Palpable pedal pulse on right.  Stressed importance of coming back for the operation with Dr. Roxan Hockey.    Jaquaveon Bilal S.

## 2014-07-16 NOTE — Progress Notes (Signed)
Pre-op Cardiac Surgery  Carotid Findings:  Bilateral:  1-39% ICA stenosis.  Vertebral artery flow is antegrade.      Upper Extremity Right Left  Brachial Pressures 130   Radial Waveforms Tri Tri  Ulnar Waveforms Tri Tri  Palmar Arch (Allen's Test) Obliterates with radial compression, normal with ulnar compression Normal with radial compression, decreases >50% with ulnar compression   Findings:  Palpable pedal pulses.   Landry Mellow, RDMS, RVT 07/16/2014

## 2014-07-16 NOTE — Progress Notes (Signed)
Pt to discharge via wheelchair, escorted by staff. Discharge instructions provided, verbalizes understanding and does teach back appropriately. PIVs d/c'd, sites without s/s of complications. Tele d/c'd and returned to unit. Denies needs or questions at this time.

## 2014-07-17 ENCOUNTER — Other Ambulatory Visit: Payer: Self-pay | Admitting: *Deleted

## 2014-07-17 DIAGNOSIS — I2510 Atherosclerotic heart disease of native coronary artery without angina pectoris: Secondary | ICD-10-CM

## 2014-07-17 DIAGNOSIS — I350 Nonrheumatic aortic (valve) stenosis: Secondary | ICD-10-CM

## 2014-07-17 LAB — POCT I-STAT 3, VENOUS BLOOD GAS (G3P V)
Acid-base deficit: 1 mmol/L (ref 0.0–2.0)
BICARBONATE: 24.6 meq/L — AB (ref 20.0–24.0)
O2 Saturation: 66 %
PO2 VEN: 36 mmHg (ref 30.0–45.0)
TCO2: 26 mmol/L (ref 0–100)
pCO2, Ven: 44.2 mmHg — ABNORMAL LOW (ref 45.0–50.0)
pH, Ven: 7.353 — ABNORMAL HIGH (ref 7.250–7.300)

## 2014-07-19 NOTE — Pre-Procedure Instructions (Signed)
Donald Escobar.  07/19/2014   Your procedure is scheduled on:  Wednesday, February 10.  Report to [facility] Admitting at 6:30 AM.  Call this number if you have problems the morning of surgery: [phone]                For any other questions, please call [phone], Monday - Friday 8 AM - 4 PM.  Remember:   Do not eat food or drink liquids after midnight Tuesday, February 9.   Take these medicines the morning of surgery with A SIP OF WATER:carvedilol (COREG).               Take if needed: nitroGLYCERIN (NITROSTAT), use Albuterol inhaler if needed and bring it to the hospital with you.   Do not wear jewelry, make-up or nail polish.  Do not wear lotions, powders, or perfumes.    Men may shave face and neck.  Do not bring valuables to the hospital.              [facility] is not responsible  for any belongings or valuables.               Contacts, dentures or bridgework may not be worn into surgery.  Leave suitcase in the car. After surgery it may be brought to your room.  For patients admitted to the hospital, discharge time is determined by yourtreatment team.             Special Instructions: Review  [facility] - Preparing For Surgery.   Please read over the following fact sheets that you were given: Pain Booklet, Coughing and Deep Breathing, Blood Transfusion Information and Surgical Site Infection Prevention And Incentive Spirometry.

## 2014-07-22 ENCOUNTER — Encounter (HOSPITAL_COMMUNITY): Payer: Self-pay

## 2014-07-22 ENCOUNTER — Encounter (HOSPITAL_COMMUNITY)
Admission: RE | Admit: 2014-07-22 | Discharge: 2014-07-22 | Disposition: A | Discharge status: Home / Self Care | Payer: BLUE CROSS/BLUE SHIELD | Source: Ambulatory Visit | Attending: Thoracic Surgery (Cardiothoracic Vascular Surgery) | Admitting: Thoracic Surgery (Cardiothoracic Vascular Surgery)

## 2014-07-22 VITALS — BP 135/65 | HR 94 | Temp 97.5°F | Resp 20 | Ht 65.0 in | Wt 244.6 lb

## 2014-07-22 DIAGNOSIS — I350 Nonrheumatic aortic (valve) stenosis: Secondary | ICD-10-CM

## 2014-07-22 DIAGNOSIS — I2510 Atherosclerotic heart disease of native coronary artery without angina pectoris: Secondary | ICD-10-CM

## 2014-07-22 HISTORY — DX: Reserved for inherently not codable concepts without codable children: IMO0001

## 2014-07-22 HISTORY — DX: Personal history of urinary calculi: Z87.442

## 2014-07-22 HISTORY — DX: Pneumonia, unspecified organism: J18.9

## 2014-07-22 LAB — PROTIME-INR
INR: 1.13 (ref 0.00–1.49)
PROTHROMBIN TIME: 14.6 s (ref 11.6–15.2)

## 2014-07-22 LAB — COMPREHENSIVE METABOLIC PANEL
ALK PHOS: 75 U/L (ref 39–117)
ALT: 20 U/L (ref 0–53)
AST: 25 U/L (ref 0–37)
Albumin: 4 g/dL (ref 3.5–5.2)
Anion gap: 8 (ref 5–15)
BILIRUBIN TOTAL: 1.3 mg/dL — AB (ref 0.3–1.2)
BUN: 17 mg/dL (ref 6–23)
CO2: 27 mmol/L (ref 19–32)
Calcium: 9.3 mg/dL (ref 8.4–10.5)
Chloride: 100 mmol/L (ref 96–112)
Creatinine, Ser: 1.32 mg/dL (ref 0.50–1.35)
GFR calc Af Amer: 66 mL/min — ABNORMAL LOW (ref >90)
GFR, EST NON AFRICAN AMERICAN: 57 mL/min — AB (ref >90)
GLUCOSE: 109 mg/dL — AB (ref 70–99)
POTASSIUM: 4.2 mmol/L (ref 3.5–5.1)
SODIUM: 135 mmol/L (ref 135–145)
Total Protein: 7.1 g/dL (ref 6.0–8.3)

## 2014-07-22 LAB — CBC
HEMATOCRIT: 38.1 % — AB (ref 39.0–52.0)
Hemoglobin: 12.9 g/dL — ABNORMAL LOW (ref 13.0–17.0)
MCH: 30.5 pg (ref 26.0–34.0)
MCHC: 33.9 g/dL (ref 30.0–36.0)
MCV: 90.1 fL (ref 78.0–100.0)
Platelets: 321 10*3/uL (ref 150–400)
RBC: 4.23 MIL/uL (ref 4.22–5.81)
RDW: 13.4 % (ref 11.5–15.5)
WBC: 13.3 10*3/uL — ABNORMAL HIGH (ref 4.0–10.5)

## 2014-07-22 LAB — URINALYSIS, ROUTINE W REFLEX MICROSCOPIC
Bilirubin Urine: NEGATIVE
Glucose, UA: NEGATIVE mg/dL
HGB URINE DIPSTICK: NEGATIVE
Ketones, ur: NEGATIVE mg/dL
Nitrite: NEGATIVE
PH: 5 (ref 5.0–8.0)
Protein, ur: NEGATIVE mg/dL
Specific Gravity, Urine: 1.016 (ref 1.005–1.030)
Urobilinogen, UA: 0.2 mg/dL (ref 0.0–1.0)

## 2014-07-22 LAB — URINE MICROSCOPIC-ADD ON

## 2014-07-22 LAB — SURGICAL PCR SCREEN
MRSA, PCR: NEGATIVE
Staphylococcus aureus: POSITIVE — AB

## 2014-07-22 LAB — TYPE AND SCREEN
ABO/RH(D): A POS
ANTIBODY SCREEN: NEGATIVE

## 2014-07-22 LAB — ABO/RH: ABO/RH(D): A POS

## 2014-07-22 LAB — APTT: aPTT: 29 seconds (ref 24–37)

## 2014-07-22 NOTE — Progress Notes (Signed)
   07/22/14 1414  OBSTRUCTIVE SLEEP APNEA  Have you ever been diagnosed with sleep apnea through a sleep study? No  Do you snore loudly (loud enough to be heard through closed doors)?  0  Do you often feel tired, fatigued, or sleepy during the daytime? 0  Has anyone observed you stop breathing during your sleep? 0  Do you have, or are you being treated for high blood pressure? 1  BMI more than 35 kg/m2? 1  Age over 61 years old? 1  Neck circumference greater than 40 cm/16 inches? 1 (17.5)  Gender: 1  Obstructive Sleep Apnea Score 5  Score 4 or greater  Results sent to PCP

## 2014-07-22 NOTE — Progress Notes (Signed)
Mupirocin Ointment Rx called into [facility] on [address] for positive PCR of staph. Left message on pt's voicemail

## 2014-07-23 LAB — HEMOGLOBIN A1C
HEMOGLOBIN A1C: 5.7 % — AB (ref 4.8–5.6)
MEAN PLASMA GLUCOSE: 117 mg/dL

## 2014-07-23 MED ORDER — SODIUM CHLORIDE 0.9 % IV SOLN
INTRAVENOUS | Status: DC
  Filled 2014-07-23: qty 30

## 2014-07-23 MED ORDER — DEXMEDETOMIDINE HCL IN NACL 400 MCG/100ML IV SOLN
0.1000 ug/kg/h | INTRAVENOUS | Status: AC
  Administered 2014-07-24: 0.2 ug/kg/h via INTRAVENOUS
  Filled 2014-07-23: qty 100

## 2014-07-23 MED ORDER — VANCOMYCIN HCL 10 G IV SOLR
1500.0000 mg | INTRAVENOUS | Status: AC
  Administered 2014-07-24: 1500 mg via INTRAVENOUS
  Filled 2014-07-23: qty 1500

## 2014-07-23 MED ORDER — PLASMA-LYTE 148 IV SOLN
INTRAVENOUS | Status: AC
  Administered 2014-07-24: 500 mL
  Filled 2014-07-23: qty 2.5

## 2014-07-23 MED ORDER — SODIUM CHLORIDE 0.9 % IV SOLN
INTRAVENOUS | Status: AC
  Administered 2014-07-24: 1.2 [IU]/h via INTRAVENOUS
  Filled 2014-07-23: qty 2.5

## 2014-07-23 MED ORDER — METOPROLOL TARTRATE 12.5 MG HALF TABLET: 12.5000 mg | ORAL_TABLET | Freq: Once | ORAL | Status: DC

## 2014-07-23 MED ORDER — SODIUM CHLORIDE 0.9 % IV SOLN
INTRAVENOUS | Status: AC
  Administered 2014-07-24: 70 mL/h via INTRAVENOUS
  Filled 2014-07-23: qty 40

## 2014-07-23 MED ORDER — DOPAMINE-DEXTROSE 3.2-5 MG/ML-% IV SOLN
0.0000 ug/kg/min | INTRAVENOUS | Status: DC
  Filled 2014-07-23: qty 250

## 2014-07-23 MED ORDER — DEXTROSE 5 % IV SOLN
750.0000 mg | INTRAVENOUS | Status: DC
  Filled 2014-07-23: qty 750

## 2014-07-23 MED ORDER — POTASSIUM CHLORIDE 2 MEQ/ML IV SOLN
80.0000 meq | INTRAVENOUS | Status: DC
  Filled 2014-07-23: qty 40

## 2014-07-23 MED ORDER — PHENYLEPHRINE HCL 10 MG/ML IJ SOLN
30.0000 ug/min | INTRAVENOUS | Status: DC
  Administered 2014-07-24: 20 ug/min via INTRAVENOUS
  Filled 2014-07-23: qty 2

## 2014-07-23 MED ORDER — DEXTROSE 5 % IV SOLN
1.5000 g | INTRAVENOUS | Status: AC
  Administered 2014-07-24: .75 g via INTRAVENOUS
  Administered 2014-07-24: 1.5 g via INTRAVENOUS
  Filled 2014-07-23 (×2): qty 1.5

## 2014-07-23 MED ORDER — MAGNESIUM SULFATE 50 % IJ SOLN
40.0000 meq | INTRAMUSCULAR | Status: DC
  Filled 2014-07-23: qty 10

## 2014-07-23 MED ORDER — NITROGLYCERIN IN D5W 200-5 MCG/ML-% IV SOLN
2.0000 ug/min | INTRAVENOUS | Status: AC
  Administered 2014-07-24: 10 ug/min via INTRAVENOUS
  Filled 2014-07-23 (×2): qty 250

## 2014-07-23 MED ORDER — DEXTROSE 5 % IV SOLN
0.0000 ug/min | INTRAVENOUS | Status: DC
  Filled 2014-07-23: qty 4

## 2014-07-23 NOTE — Anesthesia Preprocedure Evaluation (Signed)
Anesthesia Evaluation  Patient identified by MRN, date of birth, ID band Patient awake    Reviewed: Allergy & Precautions, NPO status , Patient's Chart, lab work & pertinent test results  Airway Mallampati: II  TM Distance: >3 FB Neck ROM: Full    Dental no notable dental hx.    Pulmonary shortness of breath, asthma , pneumonia -, resolved,  breath sounds clear to auscultation  Pulmonary exam normal       Cardiovascular hypertension, Pt. on medications + CAD, + Past MI and +CHF + Valvular Problems/Murmurs AI and AS Rhythm:Regular Rate:Normal     Neuro/Psych negative neurological ROS  negative psych ROS   GI/Hepatic negative GI ROS, Neg liver ROS,   Endo/Other  Morbid obesity  Renal/GU negative Renal ROS     Musculoskeletal negative musculoskeletal ROS (+)   Abdominal (+) + obese,   Peds  Hematology negative hematology ROS (+)   Anesthesia Other Findings   Reproductive/Obstetrics negative OB ROS                             Anesthesia Physical Anesthesia Plan  ASA: IV  Anesthesia Plan: General   Post-op Pain Management:    Induction: Intravenous  Airway Management Planned: Oral ETT  Additional Equipment: Arterial line, PA Cath, CVP, TEE and Ultrasound Guidance Line Placement  Intra-op Plan:   Post-operative Plan: Post-operative intubation/ventilation  Informed Consent: I have reviewed the patients History and Physical, chart, labs and discussed the procedure including the risks, benefits and alternatives for the proposed anesthesia with the patient or authorized representative who has indicated his/her understanding and acceptance.   Dental advisory given  Plan Discussed with: CRNA  Anesthesia Plan Comments:         Anesthesia Quick Evaluation

## 2014-07-24 ENCOUNTER — Inpatient Hospital Stay (HOSPITAL_COMMUNITY): Payer: BLUE CROSS/BLUE SHIELD

## 2014-07-24 ENCOUNTER — Inpatient Hospital Stay (HOSPITAL_COMMUNITY): Payer: BLUE CROSS/BLUE SHIELD | Admitting: Anesthesiology

## 2014-07-24 ENCOUNTER — Inpatient Hospital Stay (HOSPITAL_COMMUNITY)
Admission: RE | Admit: 2014-07-24 | Discharge: 2014-07-30 | DRG: 220 | Disposition: A | Discharge status: Skilled Nursing Facility | Payer: BLUE CROSS/BLUE SHIELD | Source: Ambulatory Visit | Attending: Thoracic Surgery (Cardiothoracic Vascular Surgery) | Admitting: Thoracic Surgery (Cardiothoracic Vascular Surgery)

## 2014-07-24 ENCOUNTER — Encounter (HOSPITAL_COMMUNITY)
Admission: RE | Disposition: A | Discharge status: Skilled Nursing Facility | Payer: BLUE CROSS/BLUE SHIELD | Source: Ambulatory Visit | Attending: Thoracic Surgery (Cardiothoracic Vascular Surgery)

## 2014-07-24 ENCOUNTER — Encounter (HOSPITAL_COMMUNITY): Payer: Self-pay | Admitting: *Deleted

## 2014-07-24 DIAGNOSIS — J45909 Unspecified asthma, uncomplicated: Secondary | ICD-10-CM | POA: Diagnosis present

## 2014-07-24 DIAGNOSIS — I4891 Unspecified atrial fibrillation: Secondary | ICD-10-CM | POA: Diagnosis not present

## 2014-07-24 DIAGNOSIS — I10 Essential (primary) hypertension: Secondary | ICD-10-CM | POA: Diagnosis present

## 2014-07-24 DIAGNOSIS — R0602 Shortness of breath: Secondary | ICD-10-CM | POA: Diagnosis present

## 2014-07-24 DIAGNOSIS — D62 Acute posthemorrhagic anemia: Secondary | ICD-10-CM | POA: Diagnosis not present

## 2014-07-24 DIAGNOSIS — C8590 Non-Hodgkin lymphoma, unspecified, unspecified site: Secondary | ICD-10-CM | POA: Diagnosis present

## 2014-07-24 DIAGNOSIS — R5381 Other malaise: Secondary | ICD-10-CM | POA: Diagnosis not present

## 2014-07-24 DIAGNOSIS — J9811 Atelectasis: Secondary | ICD-10-CM | POA: Diagnosis not present

## 2014-07-24 DIAGNOSIS — Z952 Presence of prosthetic heart valve: Secondary | ICD-10-CM

## 2014-07-24 DIAGNOSIS — Z01812 Encounter for preprocedural laboratory examination: Secondary | ICD-10-CM

## 2014-07-24 DIAGNOSIS — E8770 Fluid overload, unspecified: Secondary | ICD-10-CM | POA: Diagnosis not present

## 2014-07-24 DIAGNOSIS — I2510 Atherosclerotic heart disease of native coronary artery without angina pectoris: Secondary | ICD-10-CM

## 2014-07-24 DIAGNOSIS — K5900 Constipation, unspecified: Secondary | ICD-10-CM | POA: Diagnosis not present

## 2014-07-24 DIAGNOSIS — Z6841 Body Mass Index (BMI) 40.0 and over, adult: Secondary | ICD-10-CM

## 2014-07-24 DIAGNOSIS — I351 Nonrheumatic aortic (valve) insufficiency: Principal | ICD-10-CM | POA: Diagnosis present

## 2014-07-24 DIAGNOSIS — Z951 Presence of aortocoronary bypass graft: Secondary | ICD-10-CM

## 2014-07-24 DIAGNOSIS — I350 Nonrheumatic aortic (valve) stenosis: Secondary | ICD-10-CM

## 2014-07-24 HISTORY — PX: TEE WITHOUT CARDIOVERSION: SHX5443

## 2014-07-24 HISTORY — PX: AORTIC VALVE REPLACEMENT: SHX41

## 2014-07-24 HISTORY — PX: CORONARY ARTERY BYPASS GRAFT: SHX141

## 2014-07-24 LAB — BLOOD GAS, ARTERIAL
Acid-base deficit: 0.7 mmol/L (ref 0.0–2.0)
Bicarbonate: 22.1 mEq/L (ref 20.0–24.0)
Drawn by: 428831
FIO2: 0.21 %
O2 Saturation: 98.9 %
PCO2 ART: 28.7 mmHg — AB (ref 35.0–45.0)
PO2 ART: 135 mmHg — AB (ref 80.0–100.0)
Patient temperature: 98.6
TCO2: 23 mmol/L (ref 0–100)
pH, Arterial: 7.499 — ABNORMAL HIGH (ref 7.350–7.450)

## 2014-07-24 LAB — POCT I-STAT GLUCOSE
Glucose, Bld: 165 mg/dL — ABNORMAL HIGH (ref 70–99)
OPERATOR ID: 3402

## 2014-07-24 LAB — POCT I-STAT, CHEM 8
BUN: 14 mg/dL (ref 6–23)
BUN: 14 mg/dL (ref 6–23)
BUN: 12 mg/dL (ref 6–23)
BUN: 14 mg/dL (ref 6–23)
BUN: 13 mg/dL (ref 6–23)
BUN: 12 mg/dL (ref 6–23)
CALCIUM ION: 1.09 mmol/L — AB (ref 1.13–1.30)
CHLORIDE: 102 mmol/L (ref 96–112)
CHLORIDE: 100 mmol/L (ref 96–112)
CHLORIDE: 106 mmol/L (ref 96–112)
CREATININE: 0.9 mg/dL (ref 0.50–1.35)
CREATININE: 0.7 mg/dL (ref 0.50–1.35)
CREATININE: 0.8 mg/dL (ref 0.50–1.35)
CREATININE: 0.9 mg/dL (ref 0.50–1.35)
Calcium, Ion: 1.22 mmol/L (ref 1.13–1.30)
Calcium, Ion: 1.22 mmol/L (ref 1.13–1.30)
Calcium, Ion: 1.03 mmol/L — ABNORMAL LOW (ref 1.13–1.30)
Calcium, Ion: 1.14 mmol/L (ref 1.13–1.30)
Calcium, Ion: 1.18 mmol/L (ref 1.13–1.30)
Chloride: 102 mmol/L (ref 96–112)
Chloride: 100 mmol/L (ref 96–112)
Chloride: 100 mmol/L (ref 96–112)
Creatinine, Ser: 0.8 mg/dL (ref 0.50–1.35)
Creatinine, Ser: 0.8 mg/dL (ref 0.50–1.35)
GLUCOSE: 132 mg/dL — AB (ref 70–99)
GLUCOSE: 186 mg/dL — AB (ref 70–99)
Glucose, Bld: 101 mg/dL — ABNORMAL HIGH (ref 70–99)
Glucose, Bld: 128 mg/dL — ABNORMAL HIGH (ref 70–99)
Glucose, Bld: 181 mg/dL — ABNORMAL HIGH (ref 70–99)
Glucose, Bld: 122 mg/dL — ABNORMAL HIGH (ref 70–99)
HCT: 30 % — ABNORMAL LOW (ref 39.0–52.0)
HCT: 27 % — ABNORMAL LOW (ref 39.0–52.0)
HCT: 26 % — ABNORMAL LOW (ref 39.0–52.0)
HEMATOCRIT: 34 % — AB (ref 39.0–52.0)
HEMATOCRIT: 23 % — AB (ref 39.0–52.0)
HEMATOCRIT: 27 % — AB (ref 39.0–52.0)
HEMOGLOBIN: 10.2 g/dL — AB (ref 13.0–17.0)
HEMOGLOBIN: 7.8 g/dL — AB (ref 13.0–17.0)
Hemoglobin: 11.6 g/dL — ABNORMAL LOW (ref 13.0–17.0)
Hemoglobin: 9.2 g/dL — ABNORMAL LOW (ref 13.0–17.0)
Hemoglobin: 8.8 g/dL — ABNORMAL LOW (ref 13.0–17.0)
Hemoglobin: 9.2 g/dL — ABNORMAL LOW (ref 13.0–17.0)
POTASSIUM: 3.6 mmol/L (ref 3.5–5.1)
POTASSIUM: 4.1 mmol/L (ref 3.5–5.1)
POTASSIUM: 4.1 mmol/L (ref 3.5–5.1)
Potassium: 4 mmol/L (ref 3.5–5.1)
Potassium: 4.3 mmol/L (ref 3.5–5.1)
Potassium: 4.4 mmol/L (ref 3.5–5.1)
SODIUM: 138 mmol/L (ref 135–145)
SODIUM: 138 mmol/L (ref 135–145)
SODIUM: 137 mmol/L (ref 135–145)
Sodium: 140 mmol/L (ref 135–145)
Sodium: 134 mmol/L — ABNORMAL LOW (ref 135–145)
Sodium: 138 mmol/L (ref 135–145)
TCO2: 22 mmol/L (ref 0–100)
TCO2: 22 mmol/L (ref 0–100)
TCO2: 20 mmol/L (ref 0–100)
TCO2: 23 mmol/L (ref 0–100)
TCO2: 21 mmol/L (ref 0–100)
TCO2: 20 mmol/L (ref 0–100)

## 2014-07-24 LAB — CBC
HCT: 26.9 % — ABNORMAL LOW (ref 39.0–52.0)
HEMATOCRIT: 26 % — AB (ref 39.0–52.0)
Hemoglobin: 9.2 g/dL — ABNORMAL LOW (ref 13.0–17.0)
Hemoglobin: 8.9 g/dL — ABNORMAL LOW (ref 13.0–17.0)
MCH: 30.9 pg (ref 26.0–34.0)
MCH: 30.8 pg (ref 26.0–34.0)
MCHC: 34.2 g/dL (ref 30.0–36.0)
MCHC: 34.2 g/dL (ref 30.0–36.0)
MCV: 90.3 fL (ref 78.0–100.0)
MCV: 90 fL (ref 78.0–100.0)
PLATELETS: 180 10*3/uL (ref 150–400)
Platelets: 167 10*3/uL (ref 150–400)
RBC: 2.98 MIL/uL — ABNORMAL LOW (ref 4.22–5.81)
RBC: 2.89 MIL/uL — ABNORMAL LOW (ref 4.22–5.81)
RDW: 13.6 % (ref 11.5–15.5)
RDW: 13.7 % (ref 11.5–15.5)
WBC: 18.8 10*3/uL — ABNORMAL HIGH (ref 4.0–10.5)
WBC: 14.4 10*3/uL — ABNORMAL HIGH (ref 4.0–10.5)

## 2014-07-24 LAB — POCT I-STAT 3, ART BLOOD GAS (G3+)
ACID-BASE DEFICIT: 2 mmol/L (ref 0.0–2.0)
Acid-base deficit: 1 mmol/L (ref 0.0–2.0)
Acid-base deficit: 4 mmol/L — ABNORMAL HIGH (ref 0.0–2.0)
Acid-base deficit: 4 mmol/L — ABNORMAL HIGH (ref 0.0–2.0)
BICARBONATE: 21.1 meq/L (ref 20.0–24.0)
BICARBONATE: 21.2 meq/L (ref 20.0–24.0)
Bicarbonate: 25.1 mEq/L — ABNORMAL HIGH (ref 20.0–24.0)
Bicarbonate: 24.4 mEq/L — ABNORMAL HIGH (ref 20.0–24.0)
O2 SAT: 100 %
O2 Saturation: 96 %
O2 Saturation: 98 %
O2 Saturation: 96 %
PCO2 ART: 37.4 mmHg (ref 35.0–45.0)
PH ART: 7.364 (ref 7.350–7.450)
PO2 ART: 293 mmHg — AB (ref 80.0–100.0)
PO2 ART: 101 mmHg — AB (ref 80.0–100.0)
PO2 ART: 87 mmHg (ref 80.0–100.0)
Patient temperature: 36.7
TCO2: 27 mmol/L (ref 0–100)
TCO2: 26 mmol/L (ref 0–100)
TCO2: 22 mmol/L (ref 0–100)
TCO2: 22 mmol/L (ref 0–100)
pCO2 arterial: 47.7 mmHg — ABNORMAL HIGH (ref 35.0–45.0)
pCO2 arterial: 48.5 mmHg — ABNORMAL HIGH (ref 35.0–45.0)
pCO2 arterial: 37.1 mmHg (ref 35.0–45.0)
pH, Arterial: 7.33 — ABNORMAL LOW (ref 7.350–7.450)
pH, Arterial: 7.302 — ABNORMAL LOW (ref 7.350–7.450)
pH, Arterial: 7.36 (ref 7.350–7.450)
pO2, Arterial: 85 mmHg (ref 80.0–100.0)

## 2014-07-24 LAB — GLUCOSE, CAPILLARY
GLUCOSE-CAPILLARY: 99 mg/dL (ref 70–99)
GLUCOSE-CAPILLARY: 115 mg/dL — AB (ref 70–99)
Glucose-Capillary: 83 mg/dL (ref 70–99)
Glucose-Capillary: 114 mg/dL — ABNORMAL HIGH (ref 70–99)

## 2014-07-24 LAB — PROTIME-INR
INR: 1.58 — AB (ref 0.00–1.49)
Prothrombin Time: 19 seconds — ABNORMAL HIGH (ref 11.6–15.2)

## 2014-07-24 LAB — POCT I-STAT 4, (NA,K, GLUC, HGB,HCT)
GLUCOSE: 123 mg/dL — AB (ref 70–99)
HCT: 29 % — ABNORMAL LOW (ref 39.0–52.0)
Hemoglobin: 9.9 g/dL — ABNORMAL LOW (ref 13.0–17.0)
Potassium: 4.2 mmol/L (ref 3.5–5.1)
SODIUM: 137 mmol/L (ref 135–145)

## 2014-07-24 LAB — CREATININE, SERUM
CREATININE: 0.94 mg/dL (ref 0.50–1.35)
GFR calc Af Amer: >90 mL/min (ref >90)
GFR calc non Af Amer: 89 mL/min — ABNORMAL LOW (ref >90)

## 2014-07-24 LAB — HEMOGLOBIN AND HEMATOCRIT, BLOOD
HCT: 23.8 % — ABNORMAL LOW (ref 39.0–52.0)
Hemoglobin: 8.1 g/dL — ABNORMAL LOW (ref 13.0–17.0)

## 2014-07-24 LAB — PLATELET COUNT: Platelets: 171 10*3/uL (ref 150–400)

## 2014-07-24 LAB — MAGNESIUM: MAGNESIUM: 3.1 mg/dL — AB (ref 1.5–2.5)

## 2014-07-24 LAB — APTT: aPTT: 32 seconds (ref 24–37)

## 2014-07-24 SURGERY — CORONARY ARTERY BYPASS GRAFTING (CABG)
Anesthesia: General | Site: Chest

## 2014-07-24 MED ORDER — MIDAZOLAM HCL 2 MG/2ML IJ SOLN
INTRAMUSCULAR | Status: AC
  Filled 2014-07-24: qty 2

## 2014-07-24 MED ORDER — MIDAZOLAM HCL 2 MG/2ML IJ SOLN: 2.0000 mg | INTRAMUSCULAR | Status: DC | PRN

## 2014-07-24 MED ORDER — ALBUMIN HUMAN 5 % IV SOLN
250.0000 mL | INTRAVENOUS | Status: AC | PRN
  Administered 2014-07-24 (×2): 250 mL via INTRAVENOUS

## 2014-07-24 MED ORDER — DOCUSATE SODIUM 100 MG PO CAPS
200.0000 mg | ORAL_CAPSULE | Freq: Every day | ORAL | Status: DC
  Administered 2014-07-25 – 2014-07-29 (×5): 200 mg via ORAL
  Filled 2014-07-24 (×6): qty 2

## 2014-07-24 MED ORDER — MORPHINE SULFATE 2 MG/ML IJ SOLN
2.0000 mg | INTRAMUSCULAR | Status: DC | PRN
  Administered 2014-07-24 – 2014-07-25 (×4): 4 mg via INTRAVENOUS
  Administered 2014-07-25: 2 mg via INTRAVENOUS
  Administered 2014-07-25 (×3): 4 mg via INTRAVENOUS
  Administered 2014-07-25: 2 mg via INTRAVENOUS
  Filled 2014-07-24: qty 1
  Filled 2014-07-24 (×5): qty 2
  Filled 2014-07-24: qty 1
  Filled 2014-07-24 (×3): qty 2

## 2014-07-24 MED ORDER — SODIUM CHLORIDE 0.9 % IJ SOLN
3.0000 mL | Freq: Two times a day (BID) | INTRAMUSCULAR | Status: DC
  Administered 2014-07-25 – 2014-07-26 (×3): 3 mL via INTRAVENOUS

## 2014-07-24 MED ORDER — LIDOCAINE HCL (CARDIAC) 20 MG/ML IV SOLN
INTRAVENOUS | Status: AC
  Filled 2014-07-24: qty 5

## 2014-07-24 MED ORDER — PROTAMINE SULFATE 10 MG/ML IV SOLN
INTRAVENOUS | Status: DC | PRN
  Administered 2014-07-24 (×6): 40 mg via INTRAVENOUS
  Administered 2014-07-24: 10 mg via INTRAVENOUS
  Administered 2014-07-24: 40 mg via INTRAVENOUS
  Administered 2014-07-24: 10 mg via INTRAVENOUS

## 2014-07-24 MED ORDER — MIDAZOLAM HCL 10 MG/2ML IJ SOLN
INTRAMUSCULAR | Status: AC
  Filled 2014-07-24: qty 2

## 2014-07-24 MED ORDER — BISACODYL 10 MG RE SUPP
10.0000 mg | Freq: Every day | RECTAL | Status: DC
Start: 2014-07-25 — End: 2014-07-30

## 2014-07-24 MED ORDER — ASPIRIN EC 325 MG PO TBEC
325.0000 mg | DELAYED_RELEASE_TABLET | Freq: Every day | ORAL | Status: DC
  Administered 2014-07-25 – 2014-07-27 (×3): 325 mg via ORAL
  Filled 2014-07-24 (×3): qty 1

## 2014-07-24 MED ORDER — HEPARIN SODIUM (PORCINE) 1000 UNIT/ML IJ SOLN
INTRAMUSCULAR | Status: DC | PRN
  Administered 2014-07-24: 2000 [IU] via INTRAVENOUS
  Administered 2014-07-24: 34000 [IU] via INTRAVENOUS

## 2014-07-24 MED ORDER — ACETAMINOPHEN 500 MG PO TABS
1000.0000 mg | ORAL_TABLET | Freq: Four times a day (QID) | ORAL | Status: AC
  Administered 2014-07-25 – 2014-07-29 (×16): 1000 mg via ORAL
  Filled 2014-07-24 (×19): qty 2

## 2014-07-24 MED ORDER — PROPOFOL 10 MG/ML IV BOLUS
INTRAVENOUS | Status: AC
  Filled 2014-07-24: qty 20

## 2014-07-24 MED ORDER — FENTANYL CITRATE 0.05 MG/ML IJ SOLN
INTRAMUSCULAR | Status: AC
  Filled 2014-07-24: qty 5

## 2014-07-24 MED ORDER — PHENYLEPHRINE HCL 10 MG/ML IJ SOLN
10.0000 mg | INTRAVENOUS | Status: DC | PRN
  Administered 2014-07-24: 40 ug/min via INTRAVENOUS

## 2014-07-24 MED ORDER — DEXMEDETOMIDINE HCL IN NACL 200 MCG/50ML IV SOLN
0.0000 ug/kg/h | INTRAVENOUS | Status: DC
  Administered 2014-07-24: 0.5 ug/kg/h via INTRAVENOUS
  Filled 2014-07-24: qty 50

## 2014-07-24 MED ORDER — PROTAMINE SULFATE 10 MG/ML IV SOLN
INTRAVENOUS | Status: AC
  Filled 2014-07-24: qty 5

## 2014-07-24 MED ORDER — ALBUMIN HUMAN 5 % IV SOLN
INTRAVENOUS | Status: DC | PRN
  Administered 2014-07-24 (×2): via INTRAVENOUS

## 2014-07-24 MED ORDER — MAGNESIUM SULFATE 4 GM/100ML IV SOLN
4.0000 g | Freq: Once | INTRAVENOUS | Status: AC
  Administered 2014-07-24: 4 g via INTRAVENOUS
  Filled 2014-07-24: qty 100

## 2014-07-24 MED ORDER — SODIUM CHLORIDE 0.9 % IR SOLN
Status: DC | PRN
  Administered 2014-07-24: 6000 mL

## 2014-07-24 MED ORDER — ACETAMINOPHEN 160 MG/5ML PO SOLN
1000.0000 mg | Freq: Four times a day (QID) | ORAL | Status: AC
  Filled 2014-07-24: qty 40

## 2014-07-24 MED ORDER — SODIUM CHLORIDE 0.9 % IV SOLN: 250.0000 mL | INTRAVENOUS | Status: DC

## 2014-07-24 MED ORDER — ACETAMINOPHEN 160 MG/5ML PO SOLN: 650.0000 mg | Freq: Once | ORAL | Status: AC

## 2014-07-24 MED ORDER — ACETAMINOPHEN 650 MG RE SUPP
650.0000 mg | Freq: Once | RECTAL | Status: AC
  Administered 2014-07-24: 650 mg via RECTAL

## 2014-07-24 MED ORDER — LACTATED RINGERS IV SOLN: 500.0000 mL | Freq: Once | INTRAVENOUS | Status: AC | PRN

## 2014-07-24 MED ORDER — ASPIRIN 81 MG PO CHEW: 324.0000 mg | CHEWABLE_TABLET | Freq: Every day | ORAL | Status: DC

## 2014-07-24 MED ORDER — ONDANSETRON HCL 4 MG/2ML IJ SOLN
4.0000 mg | Freq: Four times a day (QID) | INTRAMUSCULAR | Status: DC | PRN
  Administered 2014-07-24 – 2014-07-26 (×3): 4 mg via INTRAVENOUS
  Filled 2014-07-24 (×4): qty 2

## 2014-07-24 MED ORDER — PROPOFOL 10 MG/ML IV BOLUS
INTRAVENOUS | Status: DC | PRN
  Administered 2014-07-24: 60 mg via INTRAVENOUS
  Administered 2014-07-24: 40 mg via INTRAVENOUS

## 2014-07-24 MED ORDER — BISACODYL 5 MG PO TBEC
10.0000 mg | DELAYED_RELEASE_TABLET | Freq: Every day | ORAL | Status: DC
  Administered 2014-07-25 – 2014-07-29 (×5): 10 mg via ORAL
  Filled 2014-07-24 (×5): qty 2

## 2014-07-24 MED ORDER — HEPARIN SODIUM (PORCINE) 1000 UNIT/ML IJ SOLN
INTRAMUSCULAR | Status: AC
  Filled 2014-07-24: qty 1

## 2014-07-24 MED ORDER — DEXTROSE 5 % IV SOLN
1.5000 g | Freq: Two times a day (BID) | INTRAVENOUS | Status: AC
  Administered 2014-07-24 – 2014-07-26 (×4): 1.5 g via INTRAVENOUS
  Filled 2014-07-24 (×4): qty 1.5

## 2014-07-24 MED ORDER — SODIUM CHLORIDE 0.9 % IJ SOLN: 3.0000 mL | INTRAMUSCULAR | Status: DC | PRN

## 2014-07-24 MED ORDER — ROCURONIUM BROMIDE 100 MG/10ML IV SOLN
INTRAVENOUS | Status: DC | PRN
  Administered 2014-07-24: 50 mg via INTRAVENOUS
  Administered 2014-07-24: 100 mg via INTRAVENOUS
  Administered 2014-07-24: 30 mg via INTRAVENOUS
  Administered 2014-07-24: 50 mg via INTRAVENOUS

## 2014-07-24 MED ORDER — METOPROLOL TARTRATE 1 MG/ML IV SOLN: 2.5000 mg | INTRAVENOUS | Status: DC | PRN

## 2014-07-24 MED ORDER — INSULIN REGULAR BOLUS VIA INFUSION
0.0000 [IU] | Freq: Three times a day (TID) | INTRAVENOUS | Status: DC
  Administered 2014-07-25: 8 [IU] via INTRAVENOUS
  Administered 2014-07-25: 6 [IU] via INTRAVENOUS
  Filled 2014-07-24: qty 10

## 2014-07-24 MED ORDER — LACTATED RINGERS IV SOLN
INTRAVENOUS | Status: DC | PRN
  Administered 2014-07-24: 08:00:00 via INTRAVENOUS

## 2014-07-24 MED ORDER — DOPAMINE-DEXTROSE 3.2-5 MG/ML-% IV SOLN: 0.0000 ug/kg/min | INTRAVENOUS | Status: DC

## 2014-07-24 MED ORDER — STERILE WATER FOR INJECTION IJ SOLN
INTRAMUSCULAR | Status: AC
  Filled 2014-07-24: qty 10

## 2014-07-24 MED ORDER — SODIUM CHLORIDE 0.45 % IV SOLN
INTRAVENOUS | Status: DC
  Administered 2014-07-24: 15:00:00 via INTRAVENOUS

## 2014-07-24 MED ORDER — PANTOPRAZOLE SODIUM 40 MG PO TBEC
40.0000 mg | DELAYED_RELEASE_TABLET | Freq: Every day | ORAL | Status: DC
  Administered 2014-07-26 – 2014-07-30 (×5): 40 mg via ORAL
  Filled 2014-07-24 (×4): qty 1

## 2014-07-24 MED ORDER — CHLORHEXIDINE GLUCONATE 0.12 % MT SOLN
OROMUCOSAL | Status: AC
  Filled 2014-07-24: qty 15

## 2014-07-24 MED ORDER — ROCURONIUM BROMIDE 50 MG/5ML IV SOLN
INTRAVENOUS | Status: AC
  Filled 2014-07-24: qty 4

## 2014-07-24 MED ORDER — METOPROLOL TARTRATE 25 MG/10 ML ORAL SUSPENSION
12.5000 mg | Freq: Two times a day (BID) | ORAL | Status: DC
Start: 2014-07-24 — End: 2014-07-25
  Filled 2014-07-24 (×3): qty 5

## 2014-07-24 MED ORDER — LIDOCAINE HCL (CARDIAC) 20 MG/ML IV SOLN: INTRAVENOUS | Status: DC | PRN

## 2014-07-24 MED ORDER — POTASSIUM CHLORIDE 10 MEQ/50ML IV SOLN: 10.0000 meq | INTRAVENOUS | Status: AC

## 2014-07-24 MED ORDER — CHLORHEXIDINE GLUCONATE 0.12 % MT SOLN
15.0000 mL | Freq: Two times a day (BID) | OROMUCOSAL | Status: DC
  Administered 2014-07-24 – 2014-07-25 (×2): 15 mL via OROMUCOSAL
  Filled 2014-07-24: qty 15

## 2014-07-24 MED ORDER — OXYCODONE HCL 5 MG PO TABS
5.0000 mg | ORAL_TABLET | ORAL | Status: DC | PRN
  Administered 2014-07-25: 5 mg via ORAL
  Administered 2014-07-25 – 2014-07-26 (×4): 10 mg via ORAL
  Filled 2014-07-24: qty 2
  Filled 2014-07-24: qty 1
  Filled 2014-07-24 (×3): qty 2

## 2014-07-24 MED ORDER — HEMOSTATIC AGENTS (NO CHARGE) OPTIME
TOPICAL | Status: DC | PRN
  Administered 2014-07-24: 1 via TOPICAL

## 2014-07-24 MED ORDER — MORPHINE SULFATE 2 MG/ML IJ SOLN: 1.0000 mg | INTRAMUSCULAR | Status: AC | PRN

## 2014-07-24 MED ORDER — FAMOTIDINE IN NACL 20-0.9 MG/50ML-% IV SOLN
20.0000 mg | Freq: Two times a day (BID) | INTRAVENOUS | Status: AC
  Administered 2014-07-24 (×2): 20 mg via INTRAVENOUS
  Filled 2014-07-24: qty 50

## 2014-07-24 MED ORDER — ATORVASTATIN CALCIUM 40 MG PO TABS
40.0000 mg | ORAL_TABLET | Freq: Every evening | ORAL | Status: DC
  Administered 2014-07-25 – 2014-07-29 (×5): 40 mg via ORAL
  Filled 2014-07-24 (×6): qty 1

## 2014-07-24 MED ORDER — METOPROLOL TARTRATE 12.5 MG HALF TABLET
12.5000 mg | ORAL_TABLET | Freq: Two times a day (BID) | ORAL | Status: DC
  Filled 2014-07-24 (×3): qty 1

## 2014-07-24 MED ORDER — FENTANYL CITRATE 0.05 MG/ML IJ SOLN
INTRAMUSCULAR | Status: DC | PRN
  Administered 2014-07-24: 250 ug via INTRAVENOUS
  Administered 2014-07-24: 100 ug via INTRAVENOUS
  Administered 2014-07-24 (×3): 250 ug via INTRAVENOUS
  Administered 2014-07-24: 150 ug via INTRAVENOUS
  Administered 2014-07-24: 100 ug via INTRAVENOUS
  Administered 2014-07-24: 150 ug via INTRAVENOUS

## 2014-07-24 MED ORDER — SODIUM CHLORIDE 0.9 % IV SOLN
1.0000 g/h | INTRAVENOUS | Status: DC
  Filled 2014-07-24 (×2): qty 40

## 2014-07-24 MED ORDER — SODIUM CHLORIDE 0.9 % IJ SOLN
OROMUCOSAL | Status: DC | PRN
  Administered 2014-07-24 (×3): 4 mL via TOPICAL

## 2014-07-24 MED ORDER — TRAMADOL HCL 50 MG PO TABS: 50.0000 mg | ORAL_TABLET | ORAL | Status: DC | PRN

## 2014-07-24 MED ORDER — SODIUM CHLORIDE 0.9 % IV SOLN
INTRAVENOUS | Status: DC
  Administered 2014-07-24: 3.1 [IU]/h via INTRAVENOUS
  Administered 2014-07-24: 2.2 [IU]/h via INTRAVENOUS
  Administered 2014-07-25: 18:00:00 via INTRAVENOUS
  Filled 2014-07-24 (×2): qty 2.5

## 2014-07-24 MED ORDER — MUPIROCIN 2 % EX OINT: 1.0000 "application " | TOPICAL_OINTMENT | Freq: Two times a day (BID) | CUTANEOUS | Status: DC

## 2014-07-24 MED ORDER — LACTATED RINGERS IV SOLN: INTRAVENOUS | Status: DC

## 2014-07-24 MED ORDER — MIDAZOLAM HCL 5 MG/5ML IJ SOLN
INTRAMUSCULAR | Status: DC | PRN
  Administered 2014-07-24 (×2): 3 mg via INTRAVENOUS
  Administered 2014-07-24 (×3): 2 mg via INTRAVENOUS

## 2014-07-24 MED ORDER — SODIUM CHLORIDE 0.9 % IV SOLN
INTRAVENOUS | Status: DC | PRN
  Administered 2014-07-24: 13:00:00 via INTRAVENOUS

## 2014-07-24 MED ORDER — NITROGLYCERIN IN D5W 200-5 MCG/ML-% IV SOLN: 0.0000 ug/min | INTRAVENOUS | Status: DC

## 2014-07-24 MED ORDER — SODIUM CHLORIDE 0.9 % IV SOLN
INTRAVENOUS | Status: DC
  Administered 2014-07-24: 14:00:00 via INTRAVENOUS

## 2014-07-24 MED ORDER — VANCOMYCIN HCL IN DEXTROSE 1-5 GM/200ML-% IV SOLN
1000.0000 mg | Freq: Once | INTRAVENOUS | Status: AC
  Administered 2014-07-24: 1000 mg via INTRAVENOUS
  Filled 2014-07-24: qty 200

## 2014-07-24 MED ORDER — PHENYLEPHRINE HCL 10 MG/ML IJ SOLN
0.0000 ug/min | INTRAVENOUS | Status: DC
  Filled 2014-07-24: qty 2

## 2014-07-24 SURGICAL SUPPLY — 125 items
ADAPTER CARDIO PERF ANTE/RETRO (ADAPTER) ×3 IMPLANT
ADH SRG 12 PREFL SYR 3 SPRDR (MISCELLANEOUS)
ADPR PRFSN 84XANTGRD RTRGD (ADAPTER) ×2
APPLICATOR COTTON TIP 6IN STRL (MISCELLANEOUS) IMPLANT
ATTRACTOMAT 16X20 MAGNETIC DRP (DRAPES) ×3 IMPLANT
BAG DECANTER FOR FLEXI CONT (MISCELLANEOUS) ×3 IMPLANT
BANDAGE ELASTIC 4 VELCRO ST LF (GAUZE/BANDAGES/DRESSINGS) ×3 IMPLANT
BANDAGE ELASTIC 6 VELCRO ST LF (GAUZE/BANDAGES/DRESSINGS) ×3 IMPLANT
BASKET HEART (ORDER IN 25'S) (MISCELLANEOUS) ×1
BASKET HEART (ORDER IN 25S) (MISCELLANEOUS) ×2 IMPLANT
BLADE STERNUM SYSTEM 6 (BLADE) ×3 IMPLANT
BLADE SURG 15 STRL LF DISP TIS (BLADE) ×2 IMPLANT
BLADE SURG 15 STRL SS (BLADE) ×3
BNDG GAUZE ELAST 4 BULKY (GAUZE/BANDAGES/DRESSINGS) ×3 IMPLANT
CANISTER SUCTION 2500CC (MISCELLANEOUS) ×3 IMPLANT
CANNULA EZ GLIDE AORTIC 21FR (CANNULA) ×3 IMPLANT
CANNULA GUNDRY RCSP 15FR (MISCELLANEOUS) ×3 IMPLANT
CARDIAC SUCTION (MISCELLANEOUS) ×3 IMPLANT
CATH CPB KIT HENDRICKSON (MISCELLANEOUS) ×3 IMPLANT
CATH HEART VENT LEFT (CATHETERS) ×2 IMPLANT
CATH ROBINSON RED A/P 18FR (CATHETERS) ×6 IMPLANT
CATH THORACIC 36FR (CATHETERS) ×3 IMPLANT
CATH THORACIC 36FR RT ANG (CATHETERS) ×6 IMPLANT
CAUTERY EYE LOW TEMP 1300F FIN (OPHTHALMIC RELATED) ×3 IMPLANT
CLIP FOGARTY SPRING 6M (CLIP) IMPLANT
CLIP TI MEDIUM 24 (CLIP) IMPLANT
CLIP TI WIDE RED SMALL 24 (CLIP) ×15 IMPLANT
CONT SPEC 4OZ CLIKSEAL STRL BL (MISCELLANEOUS) ×3 IMPLANT
COVER MAYO STAND STRL (DRAPES) ×3 IMPLANT
COVER SURGICAL LIGHT HANDLE (MISCELLANEOUS) ×6 IMPLANT
CRADLE DONUT ADULT HEAD (MISCELLANEOUS) IMPLANT
DRAPE CARDIOVASCULAR INCISE (DRAPES) ×3
DRAPE SLUSH/WARMER DISC (DRAPES) ×3 IMPLANT
DRAPE SRG 135X102X78XABS (DRAPES) ×2 IMPLANT
DRSG COVADERM 4X14 (GAUZE/BANDAGES/DRESSINGS) ×3 IMPLANT
ELECT REM PT RETURN 9FT ADLT (ELECTROSURGICAL) ×6
ELECTRODE REM PT RTRN 9FT ADLT (ELECTROSURGICAL) ×4 IMPLANT
GAUZE SPONGE 4X4 12PLY STRL (GAUZE/BANDAGES/DRESSINGS) ×6 IMPLANT
GLOVE BIO SURGEON STRL SZ 6 (GLOVE) ×9 IMPLANT
GLOVE BIO SURGEON STRL SZ7.5 (GLOVE) ×6 IMPLANT
GLOVE BIOGEL PI IND STRL 6 (GLOVE) ×2 IMPLANT
GLOVE BIOGEL PI IND STRL 7.0 (GLOVE) ×16 IMPLANT
GLOVE BIOGEL PI INDICATOR 6 (GLOVE) ×1
GLOVE BIOGEL PI INDICATOR 7.0 (GLOVE) ×8
GLOVE EUDERMIC 7 POWDERFREE (GLOVE) ×9 IMPLANT
GLOVE SURG SIGNA 7.5 PF LTX (GLOVE) ×9 IMPLANT
GOWN STRL REUS W/ TWL LRG LVL3 (GOWN DISPOSABLE) ×20 IMPLANT
GOWN STRL REUS W/ TWL XL LVL3 (GOWN DISPOSABLE) ×4 IMPLANT
GOWN STRL REUS W/TWL LRG LVL3 (GOWN DISPOSABLE) ×30
GOWN STRL REUS W/TWL XL LVL3 (GOWN DISPOSABLE) ×6
HEMOSTAT POWDER SURGIFOAM 1G (HEMOSTASIS) ×9 IMPLANT
HEMOSTAT SURGICEL 2X14 (HEMOSTASIS) ×3 IMPLANT
INSERT FOGARTY XLG (MISCELLANEOUS) IMPLANT
KIT BASIN OR (CUSTOM PROCEDURE TRAY) ×3 IMPLANT
KIT ROOM TURNOVER OR (KITS) ×3 IMPLANT
KIT SUCTION CATH 14FR (SUCTIONS) ×6 IMPLANT
KIT VASOVIEW W/TROCAR VH 2000 (KITS) ×3 IMPLANT
LINE VENT (MISCELLANEOUS) ×3 IMPLANT
MARKER GRAFT CORONARY BYPASS (MISCELLANEOUS) ×9 IMPLANT
NS IRRIG 1000ML POUR BTL (IV SOLUTION) ×18 IMPLANT
PACK OPEN HEART (CUSTOM PROCEDURE TRAY) ×3 IMPLANT
PAD ARMBOARD 7.5X6 YLW CONV (MISCELLANEOUS) ×12 IMPLANT
PAD ELECT DEFIB RADIOL ZOLL (MISCELLANEOUS) ×3 IMPLANT
PENCIL BUTTON HOLSTER BLD 10FT (ELECTRODE) ×3 IMPLANT
PUNCH AORTIC ROTATE  4.5MM 8IN (MISCELLANEOUS) ×3 IMPLANT
PUNCH AORTIC ROTATE 4.0MM (MISCELLANEOUS) IMPLANT
PUNCH AORTIC ROTATE 4.5MM 8IN (MISCELLANEOUS) IMPLANT
PUNCH AORTIC ROTATE 5MM 8IN (MISCELLANEOUS) IMPLANT
SET CARDIOPLEGIA MPS 5001102 (MISCELLANEOUS) ×3 IMPLANT
SUT BONE WAX W31G (SUTURE) ×3 IMPLANT
SUT ETHIBON 2 0 V 52N 30 (SUTURE) ×6 IMPLANT
SUT ETHIBON EXCEL 2-0 V-5 (SUTURE) IMPLANT
SUT ETHIBOND 2 0 SH (SUTURE) ×3
SUT ETHIBOND 2 0 SH 36X2 (SUTURE) ×2 IMPLANT
SUT ETHIBOND 2 0 V4 (SUTURE) IMPLANT
SUT ETHIBOND 2 0V4 GREEN (SUTURE) IMPLANT
SUT ETHIBOND 4 0 RB 1 (SUTURE) IMPLANT
SUT ETHIBOND V-5 VALVE (SUTURE) IMPLANT
SUT MNCRL AB 4-0 PS2 18 (SUTURE) IMPLANT
SUT PROLENE 3 0 SH 1 (SUTURE) ×6 IMPLANT
SUT PROLENE 3 0 SH DA (SUTURE) ×3 IMPLANT
SUT PROLENE 4 0 RB 1 (SUTURE) ×12
SUT PROLENE 4 0 SH DA (SUTURE) IMPLANT
SUT PROLENE 4-0 RB1 .5 CRCL 36 (SUTURE) ×8 IMPLANT
SUT PROLENE 5 0 C 1 36 (SUTURE) ×6 IMPLANT
SUT PROLENE 6 0 C 1 30 (SUTURE) ×6 IMPLANT
SUT PROLENE 7 0 BV 1 (SUTURE) ×3 IMPLANT
SUT PROLENE 7 0 BV1 MDA (SUTURE) ×3 IMPLANT
SUT PROLENE 8 0 BV175 6 (SUTURE) IMPLANT
SUT SILK  1 MH (SUTURE) ×2
SUT SILK 1 MH (SUTURE) ×4 IMPLANT
SUT SILK 1 TIES 10X30 (SUTURE) ×3 IMPLANT
SUT SILK 2 0 (SUTURE) ×3
SUT SILK 2 0 SH CR/8 (SUTURE) ×6 IMPLANT
SUT SILK 2-0 18XBRD TIE 12 (SUTURE) ×2 IMPLANT
SUT SILK 3 0 SH CR/8 (SUTURE) ×3 IMPLANT
SUT SILK 4 0 (SUTURE) ×3
SUT SILK 4-0 18XBRD TIE 12 (SUTURE) ×2 IMPLANT
SUT STEEL 6MS V (SUTURE) ×3 IMPLANT
SUT STEEL STERNAL CCS#1 18IN (SUTURE) IMPLANT
SUT STEEL SZ 6 DBL 3X14 BALL (SUTURE) ×3 IMPLANT
SUT TEM PAC WIRE 2 0 SH (SUTURE) ×12 IMPLANT
SUT VIC AB 1 CTX 36 (SUTURE) ×9
SUT VIC AB 1 CTX36XBRD ANBCTR (SUTURE) ×6 IMPLANT
SUT VIC AB 2-0 CT1 27 (SUTURE) ×3
SUT VIC AB 2-0 CT1 TAPERPNT 27 (SUTURE) ×2 IMPLANT
SUT VIC AB 2-0 CTX 27 (SUTURE) ×6 IMPLANT
SUT VIC AB 3-0 SH 27 (SUTURE)
SUT VIC AB 3-0 SH 27X BRD (SUTURE) IMPLANT
SUT VIC AB 3-0 X1 27 (SUTURE) ×9 IMPLANT
SUT VICRYL 4-0 PS2 18IN ABS (SUTURE) IMPLANT
SUTURE E-PAK OPEN HEART (SUTURE) ×3 IMPLANT
SYR 10ML KIT SKIN ADHESIVE (MISCELLANEOUS) IMPLANT
SYSTEM SAHARA CHEST DRAIN ATS (WOUND CARE) ×3 IMPLANT
TAPE CLOTH SURG 4X10 WHT LF (GAUZE/BANDAGES/DRESSINGS) ×3 IMPLANT
TOWEL OR 17X24 6PK STRL BLUE (TOWEL DISPOSABLE) ×6 IMPLANT
TOWEL OR 17X26 10 PK STRL BLUE (TOWEL DISPOSABLE) ×6 IMPLANT
TRAY FOLEY IC TEMP SENS 14FR (CATHETERS) IMPLANT
TRAY FOLEY IC TEMP SENS 16FR (CATHETERS) ×3 IMPLANT
TUBE FEEDING 8FR 16IN STR KANG (MISCELLANEOUS) IMPLANT
TUBING INSUFFLATION (TUBING) ×3 IMPLANT
UNDERPAD 30X30 INCONTINENT (UNDERPADS AND DIAPERS) ×3 IMPLANT
VALVE CARP ED PERICARD (Prosthesis & Implant Heart) ×3 IMPLANT
VENT LEFT HEART 12002 (CATHETERS) ×3
WATER STERILE IRR 1000ML POUR (IV SOLUTION) ×6 IMPLANT

## 2014-07-24 NOTE — Interval H&P Note (Signed)
History and Physical Interval Note:  07/24/2014 8:07 AM  Donald Escobar  has presented today for surgery, with the diagnosis of CAD AS  The various methods of treatment have been discussed with the patient and family. After consideration of risks, benefits and other options for treatment, the patient has consented to  Procedure(s): CORONARY ARTERY BYPASS GRAFTING (CABG) (N/A) AORTIC VALVE REPLACEMENT (AVR) (N/A) POSSIBLE BENTALL PROCEDURE (N/A) TRANSESOPHAGEAL ECHOCARDIOGRAM (TEE) (N/A) as a surgical intervention .  The patient's history has been reviewed, patient examined, no change in status, stable for surgery.  I have reviewed the patient's chart and labs.  Questions were answered to the patient's satisfaction.     [name] C

## 2014-07-24 NOTE — Anesthesia Procedure Notes (Addendum)
Procedure Name: Intubation Date/Time: 07/24/2014 8:47 AM Performed by: Eligha Bridegroom Pre-anesthesia Checklist: Emergency Drugs available, Suction available, Patient being monitored, Patient identified and Timeout performed Patient Re-evaluated:Patient Re-evaluated prior to inductionOxygen Delivery Method: Circle system utilized Preoxygenation: Pre-oxygenation with 100% oxygen Intubation Type: IV induction Ventilation: Mask ventilation without difficulty and Oral airway inserted - appropriate to patient size Laryngoscope Size: Mac and 4 Grade View: Grade I Tube type: Oral Tube size: 8.0 mm Number of attempts: 1 Airway Equipment and Method: Stylet Secured at: 22 cm Tube secured with: Tape Dental Injury: Teeth and Oropharynx as per pre-operative assessment

## 2014-07-24 NOTE — Progress Notes (Signed)
NIF -40cmH20, and VC 1.2L (>10cc/kg).

## 2014-07-24 NOTE — Brief Op Note (Addendum)
07/24/2014      [address]       [place] [address]             [phone]     07/24/2014  12:41 PM  PATIENT:  Donald Escobar  59 y.o. male  PRE-OPERATIVE DIAGNOSIS:  CAD AS  POST-OPERATIVE DIAGNOSIS:  CAD AS  PROCEDURE:  Procedure(s): CORONARY ARTERY BYPASS GRAFTING (CABG) x 2, ON PUMP  LEFT INTERNAL MAMMARY ARTERY to LAD  LEFT GREATER SAPHENOUS VEIN to DIAGONAL 1 ENDOSCOPIC VEIN HARVEST LEFT THIGH  AORTIC VALVE REPLACEMENT (AVR)# 25 PERIMOUNT BOVINE PERICARDIAL VALVE (BIOPROSTHETIC)  TRANSESOPHAGEAL ECHOCARDIOGRAM (TEE)  SURGEON:  Surgeon(s): Melrose Nakayama, MD  PHYSICIAN ASSISTANT: WAYNE GOLD PA-C  ANESTHESIA:   general  PATIENT CONDITION:  ICU - intubated and hemodynamically stable.  PRE-OPERATIVE WEIGHT: 110kg  Aortic Valve Etiology   Aortic Insufficiency:  Severe  Aortic Valve Disease:  Yes.  Aortic Stenosis:  Yes. Smallest Aortic Valve Area: 1.5cm2; Highest Mean Gradient: 64mmHg.  Etiology (Choose at least one and up to  5 etiologies):  Degenerative - Calcified Aortic Valve  Procedure Performed:  Replacement: Yes.  Bioprosthetic Valve. Implant Model Number:2700, Size:25, Unique Device Identifier:[id number]  Repair/Reconstruction: No.   Aortic Annular Enlargement: No.    FINDINGS: LAD and diagonal good targets, LIMA and SV good  TEE- LV dilated, hypertrophied, severe AI  Pseudobicuspid aortic valve with partial fusion of L & R cusps  Leaflets heavily calcified, mild annular calcification  XC= 106 min CPB= 140 min

## 2014-07-24 NOTE — OR Nursing (Signed)
Second call to SICU at 1342.

## 2014-07-24 NOTE — Transfer of Care (Signed)
Immediate Anesthesia Transfer of Care Note  Patient: Donald Escobar  Procedure(s) Performed: Procedure(s): CORONARY ARTERY BYPASS GRAFTING (CABG), ON PUMP, TIMES TWO, USING LEFT INTERNAL MAMMARY ARTERY, LEFT GREATER SAPHENOUS VEIN HARVESTED ENDOSCOPICALLY (N/A) AORTIC VALVE REPLACEMENT (AVR) (N/A) TRANSESOPHAGEAL ECHOCARDIOGRAM (TEE) (N/A)  Patient Location: SICU  Anesthesia Type:General  Level of Consciousness: sedated, unresponsive and Patient remains intubated per anesthesia plan  Airway & Oxygen Therapy: Patient remains intubated per anesthesia plan and Patient placed on Ventilator (see vital sign flow sheet for setting)  Post-op Assessment: Report given to RN and Post -op Vital signs reviewed and stable  Post vital signs: Reviewed and stable  Last Vitals:  Filed Vitals:   07/24/14 1421  BP:   Pulse: 91  Temp:   Resp: 12    Complications: No apparent anesthesia complications

## 2014-07-24 NOTE — Progress Notes (Signed)
[facility] BRIEF SICU PROGRESS NOTE  Day of Surgery  S/P Procedure(s) (LRB): CORONARY ARTERY BYPASS GRAFTING (CABG), ON PUMP, TIMES TWO, USING LEFT INTERNAL MAMMARY ARTERY, LEFT GREATER SAPHENOUS VEIN HARVESTED ENDOSCOPICALLY (N/A) AORTIC VALVE REPLACEMENT (AVR) (N/A) TRANSESOPHAGEAL ECHOCARDIOGRAM (TEE) (N/A)   Starting to wake on vent AV paced w/ stable hemodynamics on IV NTG Chest tube output low UOP adequate Labs okay  Plan: Continue routine early postop  Marygrace Sandoval H 07/24/2014 7:07 PM

## 2014-07-24 NOTE — Anesthesia Postprocedure Evaluation (Signed)
Anesthesia Post Note  Patient: Donald Escobar  Procedure(s) Performed: Procedure(s) (LRB): CORONARY ARTERY BYPASS GRAFTING (CABG), ON PUMP, TIMES TWO, USING LEFT INTERNAL MAMMARY ARTERY, LEFT GREATER SAPHENOUS VEIN HARVESTED ENDOSCOPICALLY (N/A) AORTIC VALVE REPLACEMENT (AVR) (N/A) TRANSESOPHAGEAL ECHOCARDIOGRAM (TEE) (N/A)  Anesthesia type: General  Patient location: SICU  Post pain: Pain level controlled  Post assessment: Post-op Vital signs reviewed  Last Vitals: BP 101/72 mmHg  Pulse 91  Temp(Src) 36 C (Oral)  Resp 25  Wt 244 lb 9.6 oz (110.95 kg)  SpO2 100%  Post vital signs: Reviewed  Level of consciousness: sedated  Complications: No apparent anesthesia complications

## 2014-07-24 NOTE — H&P (View-Only) (Signed)
Reason for Consult: Aortic insufficiency and severe single vessel CAD Referring Physician: Drs. Croitoru/ Berry   Donald M Pincus Jr. is an 58 y.o. male.  HPI: 58 yo man presented with a cc/o palpitations and shortness of breath   Donald Escobar is a 58 yo man with history of asthma, hypertension and non-Hodgkins lymphoma treated in 2003. He has no prior history of cardiac disease. He says that over the past 2- 3 weeks he has been getting short of breath with exertion. His work requires relatively heavy exertion and he has been getting SOB with activities he used to do easily. He also complains of his heart beating irregularly. This is episodic. It comes on randomly and lasts up to ~ 20 minutes at a time.   He went to the nurse at work who referred him for further evaluation.   He saw the NP at work who recommended he get further evaluation based on an abnormal ECG. She also gave him an inhaler, which he says does help with his breathing. He went to [facility], who referred him to the ED. In the ED his BNP was 518 and his troponin was elevated at 0.13. The troponin subsequently rose to 0.18.   He is currently pain free. He does not feel SOB at rest.  He had dental work done in December and again in January with a root canal and fillings. He does not have any dental issues currently.   Past Medical History  Diagnosis Date  . Asthma   . Cancer 2003    non-hodgkins lymphoma  . Hypertension   . Acute CHF 07/11/2014    History reviewed. No pertinent past surgical history.  History reviewed. No pertinent family history.  Social History:  reports that he has never smoked. He does not have any smokeless tobacco history on file. He reports that he does not drink alcohol or use illicit drugs.  Allergies:  Allergies  Allergen Reactions  . Sulfa Antibiotics Swelling    Lip swelling    Medications:  Scheduled: . aspirin  324 mg Oral Once  . aspirin EC  81 mg Oral Daily  . carvedilol   3.125 mg Oral BID WC  . furosemide  40 mg Oral Daily  . sodium chloride  3 mL Intravenous Q12H  . sodium chloride  3 mL Intravenous Q12H    Results for orders placed or performed during the hospital encounter of 07/11/14 (from the past 48 hour(s))  Basic metabolic panel     Status: Abnormal   Collection Time: 07/14/14  3:29 AM  Result Value Ref Range   Sodium 137 135 - 145 mmol/L   Potassium 4.4 3.5 - 5.1 mmol/L   Chloride 103 96 - 112 mmol/L   CO2 27 19 - 32 mmol/L   Glucose, Bld 100 (H) 70 - 99 mg/dL   BUN 14 6 - 23 mg/dL   Creatinine, Ser 1.09 0.50 - 1.35 mg/dL   Calcium 9.2 8.4 - 10.5 mg/dL   GFR calc non Af Amer 72 (L) >90 mL/min   GFR calc Af Amer 83 (L) >90 mL/min    Comment: (NOTE) The eGFR has been calculated using the CKD EPI equation. This calculation has not been validated in all clinical situations. eGFR's persistently <90 mL/min signify possible Chronic Kidney Disease.    Anion gap 7 5 - 15  Protime-INR     Status: None   Collection Time: 07/15/14  3:09 AM  Result Value Ref Range     Prothrombin Time 14.4 11.6 - 15.2 seconds   INR 1.10 0.00 - 1.49    No results found.  Review of Systems  Constitutional: Positive for malaise/fatigue. Negative for fever and chills.  HENT:       Had dental work in December and January- fillings  Respiratory: Positive for cough, shortness of breath and wheezing.   Cardiovascular: Positive for palpitations. Negative for chest pain, orthopnea and PND.  Gastrointestinal: Negative.   Genitourinary: Positive for frequency.  Musculoskeletal: Positive for myalgias.  Neurological: Negative.  Negative for weakness.  Endo/Heme/Allergies: Does not bruise/bleed easily.  All other systems reviewed and are negative.  Blood pressure 122/50, pulse 71, temperature 98.3 F (36.8 C), temperature source Oral, resp. rate 12, height 5' 5" (1.651 m), weight 243 lb 11.2 oz (110.542 kg), SpO2 98 %. Physical Exam  Vitals reviewed. Constitutional:  He is oriented to person, place, and time. No distress.  obese  HENT:  Head: Normocephalic and atraumatic.  Radiation induced skin changes right scalp  Eyes: EOM are normal. Pupils are equal, round, and reactive to light.  Neck: Neck supple. No thyromegaly present.  Cardiovascular: Normal rate and regular rhythm.   Murmur (3/6 systolic, 1/6 diastolic) heard. Respiratory: Effort normal and breath sounds normal. He has no wheezes. He has no rales.  GI: Soft. There is no tenderness.  Musculoskeletal: He exhibits no edema.  Lymphadenopathy:    He has no cervical adenopathy.  Neurological: He is alert and oriented to person, place, and time. No cranial nerve deficit.  No focal motor deficit  Skin: Skin is warm and dry.   Echocardiogram 07/12/14 Study Conclusions  - Left ventricle: The cavity size was moderately dilated. Left ventricular geometry showed evidence of eccentric hypertrophy. Systolic function was severely reduced. The estimated ejection fraction was in the range of 25% to 30%. Severe diffuse hypokinesis with regional variations. Possible disproportionately severe hypokinesis of the anteroseptal and anterior myocardium. Features are consistent with a pseudonormal left ventricular filling pattern, with concomitant abnormal relaxation and increased filling pressure (grade 2 diastolic dysfunction). - Aortic valve: There was mild stenosis. There was moderate to severe regurgitation. Valve area (VTI): 2.16 cm^2. Valve area (Vmax): 1.5 cm^2. Valve area (Vmean): 1.65 cm^2. - Mitral valve: Calcified annulus. Mildly thickened leaflets . - Left atrium: The atrium was mildly dilated. - Pericardium, extracardiac: A trivial pericardial effusion was identified.  TEE 07/15/14 Left Ventrical: Moderate LV dysfunction   Mitral Valve: normal , trivial Donald. The opening of the MV is limited by the AI jet   Aortic Valve: calcified. The right and left cusps are  partially fused and the noncoronary cups is immobile. Functionally a bicuspid valve. No vegetation  Tricuspid Valve: trivial TR  Pulmonic Valve: no significand PI  Left Atrium/ Left atrial appendage: no thrombi   Atrial septum: no PFO by color   Aorta: normal.  Complications: No apparent complications Patient did tolerate procedure well.  Philip J. Nahser, Jr., MD, FACC 07/15/2014, 11:02 AM  CARDIAC CATHETERIZATION HEMODYNAMICS:   AO SYSTOLIC/AO DIASTOLIC: 124/65  LV SYSTOLIC/LV DIASTOLIC: 123/30  ANGIOGRAPHIC RESULTS:   1. Left main; normal  2. LAD; 90% proximal at the takeoff of a moderate-sized diagonal branch 3. Left circumflex; dominant and normal.  4. Right coronary artery; nondominant and normal 5.LIMA was subselectively visualized was widely patent. It was suitable for use during coronary artery bypass grafting 7. Left ventriculography;was not performed today. 8. Supravalvular aortogram was performed using 20 mL of Visipaque dye at 20 mL/s. The   descending thoracic aorta was mildly dilated. There was 2+ angiographic aortic insufficiency.  IMPRESSION:Donald. Escobar has high-grade proximal LAD diagonal branch bifurcation disease, moderate-to-severe aortic insufficiency with moderate to severe LV dysfunction and a generous thoracic aorta. I'm going to get a CT entrance ramp measure his descending thoracic aorta. We will get the cardiothoracic surgeons involved in his care to consult for potential aortic valve replacement plus or minus Bentall procedure and coronary artery bypass grafting.  BERRY,JONATHAN J. MD, FACC 07/15/2014 12:17 PM Impressions:  - Compared to echo report 10/2005, LVEF has diminished (previously normal) and AI is worse.   Assessment/Plan: 58 yo man with moderately severe AI, severe single vessel CAD and impaired LV function(EF 25%). He presented with CHF and modestly elevated troponin. He has been having palpitations, which are suspicious for  paroxysmal atrial fibrillation. He needs CABG and AVR for survival benefit and relief of symptoms. His aorta is modestly enlarged at around 4 cm on CT.  I discussed the findings with him. In my opinion he would benefit symptomatically and have improved survival with AVR, CABG. He needs grafts to the LAD and diagonal. We discussed the options of tissue and mechanical valves and the issues related to them(anticoagulation and longevity).   We discussed the general nature of the procedure, the need for general anesthesia, and the incisions to be used. We discussed the expected hospital stay, overall recovery and short and long term outcomes. I reviewed the indications, risks, benefits and alternatives. He understands the risks include, but are not limited to death, stroke, MI, DVT/PE, bleeding, possible need for transfusion, infections, atrial fibrillation, heart block, diaphragm dysfunction, pleural effusions, and other organ system dysfunction including respiratory, renal, or GI complications.   He is agreeable to surgery but says he needs to go home and take care of "some things" before having it done. Will discuss with Cardiology.  HENDRICKSON,STEVEN C 07/15/2014, 2:35 PM      

## 2014-07-24 NOTE — Progress Notes (Signed)
Pt takes Coreg, took at 0500 this morning; therefore, ordered Lopressor not given.  Pt has had 3 treatments of Mupirocin for + Staph from PCR. Next dose due at 2200 tonight. Pt brought from home, placed on chart.

## 2014-07-24 NOTE — OR Nursing (Signed)
First call to SICU at 1312.

## 2014-07-24 NOTE — Progress Notes (Signed)
  Echocardiogram Echocardiogram Transesophageal has been performed.  Darlina Sicilian M 07/24/2014, 9:33 AM

## 2014-07-24 NOTE — Procedures (Signed)
Extubation Procedure Note  Patient Details:   Name: Donald Escobar DOB: 1956-02-19 MRN: [id number]   Airway Documentation:  Airway 8 mm (Active)  Secured at (cm) 21 cm 07/24/2014  5:28 PM  Measured From Lips 07/24/2014  5:28 PM  Secured Location Right 07/24/2014  5:28 PM  Secured By Pink Tape 07/24/2014  5:28 PM  Cuff Pressure (cm H2O) 26 cm H2O 07/24/2014  5:28 PM  Site Condition Dry 07/24/2014  5:28 PM    Evaluation  O2 sats: stable throughout Complications: No apparent complications Patient did tolerate procedure well. Bilateral Breath Sounds: Clear, Diminished   Yes   Extubated pt to 4lmp [name] 02. Pt able to state his name. No stridor noted. Cuff leak positive. VS stable.  No complications noted.  [name] 07/24/2014, 7:57 PM

## 2014-07-25 ENCOUNTER — Inpatient Hospital Stay (HOSPITAL_COMMUNITY): Payer: BLUE CROSS/BLUE SHIELD

## 2014-07-25 LAB — GLUCOSE, CAPILLARY
GLUCOSE-CAPILLARY: 126 mg/dL — AB (ref 70–99)
GLUCOSE-CAPILLARY: 129 mg/dL — AB (ref 70–99)
GLUCOSE-CAPILLARY: 131 mg/dL — AB (ref 70–99)
GLUCOSE-CAPILLARY: 136 mg/dL — AB (ref 70–99)
GLUCOSE-CAPILLARY: 138 mg/dL — AB (ref 70–99)
GLUCOSE-CAPILLARY: 143 mg/dL — AB (ref 70–99)
GLUCOSE-CAPILLARY: 151 mg/dL — AB (ref 70–99)
GLUCOSE-CAPILLARY: 157 mg/dL — AB (ref 70–99)
Glucose-Capillary: 110 mg/dL — ABNORMAL HIGH (ref 70–99)
Glucose-Capillary: 111 mg/dL — ABNORMAL HIGH (ref 70–99)
Glucose-Capillary: 114 mg/dL — ABNORMAL HIGH (ref 70–99)
Glucose-Capillary: 118 mg/dL — ABNORMAL HIGH (ref 70–99)
Glucose-Capillary: 125 mg/dL — ABNORMAL HIGH (ref 70–99)
Glucose-Capillary: 128 mg/dL — ABNORMAL HIGH (ref 70–99)
Glucose-Capillary: 129 mg/dL — ABNORMAL HIGH (ref 70–99)
Glucose-Capillary: 139 mg/dL — ABNORMAL HIGH (ref 70–99)
Glucose-Capillary: 140 mg/dL — ABNORMAL HIGH (ref 70–99)
Glucose-Capillary: 143 mg/dL — ABNORMAL HIGH (ref 70–99)
Glucose-Capillary: 144 mg/dL — ABNORMAL HIGH (ref 70–99)
Glucose-Capillary: 211 mg/dL — ABNORMAL HIGH (ref 70–99)
Glucose-Capillary: 86 mg/dL (ref 70–99)

## 2014-07-25 LAB — CBC
HCT: 26.2 % — ABNORMAL LOW (ref 39.0–52.0)
HCT: 28.5 % — ABNORMAL LOW (ref 39.0–52.0)
HEMATOCRIT: 28.6 % — AB (ref 39.0–52.0)
HEMOGLOBIN: 9.7 g/dL — AB (ref 13.0–17.0)
Hemoglobin: 8.8 g/dL — ABNORMAL LOW (ref 13.0–17.0)
Hemoglobin: 9.6 g/dL — ABNORMAL LOW (ref 13.0–17.0)
MCH: 30.9 pg (ref 26.0–34.0)
MCH: 30.7 pg (ref 26.0–34.0)
MCH: 30.7 pg (ref 26.0–34.0)
MCHC: 33.6 g/dL (ref 30.0–36.0)
MCHC: 33.9 g/dL (ref 30.0–36.0)
MCHC: 33.7 g/dL (ref 30.0–36.0)
MCV: 91.9 fL (ref 78.0–100.0)
MCV: 90.5 fL (ref 78.0–100.0)
MCV: 91.1 fL (ref 78.0–100.0)
PLATELETS: 211 10*3/uL (ref 150–400)
Platelets: 178 10*3/uL (ref 150–400)
Platelets: 222 10*3/uL (ref 150–400)
RBC: 2.85 MIL/uL — ABNORMAL LOW (ref 4.22–5.81)
RBC: 3.16 MIL/uL — AB (ref 4.22–5.81)
RBC: 3.13 MIL/uL — ABNORMAL LOW (ref 4.22–5.81)
RDW: 13.8 % (ref 11.5–15.5)
RDW: 14.2 % (ref 11.5–15.5)
RDW: 14.4 % (ref 11.5–15.5)
WBC: 16.3 10*3/uL — AB (ref 4.0–10.5)
WBC: 21 10*3/uL — AB (ref 4.0–10.5)
WBC: 22.7 10*3/uL — ABNORMAL HIGH (ref 4.0–10.5)

## 2014-07-25 LAB — BASIC METABOLIC PANEL
ANION GAP: 6 (ref 5–15)
BUN: 9 mg/dL (ref 6–23)
CO2: 24 mmol/L (ref 19–32)
Calcium: 8 mg/dL — ABNORMAL LOW (ref 8.4–10.5)
Chloride: 108 mmol/L (ref 96–112)
Creatinine, Ser: 0.85 mg/dL (ref 0.50–1.35)
Glucose, Bld: 130 mg/dL — ABNORMAL HIGH (ref 70–99)
Potassium: 4.1 mmol/L (ref 3.5–5.1)
Sodium: 138 mmol/L (ref 135–145)

## 2014-07-25 LAB — MAGNESIUM
MAGNESIUM: 2.2 mg/dL (ref 1.5–2.5)
Magnesium: 2 mg/dL (ref 1.5–2.5)

## 2014-07-25 LAB — POCT I-STAT, CHEM 8
BUN: 11 mg/dL (ref 6–23)
CALCIUM ION: 1.17 mmol/L (ref 1.13–1.30)
CHLORIDE: 101 mmol/L (ref 96–112)
Creatinine, Ser: 0.9 mg/dL (ref 0.50–1.35)
Glucose, Bld: 217 mg/dL — ABNORMAL HIGH (ref 70–99)
HEMATOCRIT: 30 % — AB (ref 39.0–52.0)
Hemoglobin: 10.2 g/dL — ABNORMAL LOW (ref 13.0–17.0)
Potassium: 4.2 mmol/L (ref 3.5–5.1)
SODIUM: 135 mmol/L (ref 135–145)
TCO2: 20 mmol/L (ref 0–100)

## 2014-07-25 LAB — CREATININE, SERUM
CREATININE: 0.94 mg/dL (ref 0.50–1.35)
CREATININE: 1.05 mg/dL (ref 0.50–1.35)
GFR calc non Af Amer: 75 mL/min — ABNORMAL LOW (ref >90)
GFR, EST AFRICAN AMERICAN: 87 mL/min — AB (ref >90)
GFR, EST NON AFRICAN AMERICAN: 89 mL/min — AB (ref >90)

## 2014-07-25 MED ORDER — INSULIN ASPART 100 UNIT/ML ~~LOC~~ SOLN: 3.0000 [IU] | Freq: Three times a day (TID) | SUBCUTANEOUS | Status: DC

## 2014-07-25 MED ORDER — CARVEDILOL 6.25 MG PO TABS
6.2500 mg | ORAL_TABLET | Freq: Two times a day (BID) | ORAL | Status: DC
  Administered 2014-07-25 – 2014-07-30 (×11): 6.25 mg via ORAL
  Filled 2014-07-25 (×14): qty 1

## 2014-07-25 MED ORDER — FUROSEMIDE 10 MG/ML IJ SOLN
20.0000 mg | Freq: Once | INTRAMUSCULAR | Status: AC
  Administered 2014-07-25: 20 mg via INTRAVENOUS
  Filled 2014-07-25: qty 2

## 2014-07-25 MED ORDER — CETYLPYRIDINIUM CHLORIDE 0.05 % MT LIQD
7.0000 mL | Freq: Two times a day (BID) | OROMUCOSAL | Status: DC
  Administered 2014-07-25 – 2014-07-26 (×3): 7 mL via OROMUCOSAL

## 2014-07-25 MED ORDER — POTASSIUM CHLORIDE 10 MEQ/50ML IV SOLN
10.0000 meq | INTRAVENOUS | Status: AC
Start: 2014-07-25 — End: 2014-07-25
  Administered 2014-07-25 (×2): 10 meq via INTRAVENOUS
  Filled 2014-07-25 (×2): qty 50

## 2014-07-25 MED ORDER — INSULIN ASPART 100 UNIT/ML ~~LOC~~ SOLN
0.0000 [IU] | SUBCUTANEOUS | Status: DC
  Administered 2014-07-25: 2 [IU] via SUBCUTANEOUS

## 2014-07-25 MED ORDER — INSULIN DETEMIR 100 UNIT/ML ~~LOC~~ SOLN
40.0000 [IU] | Freq: Once | SUBCUTANEOUS | Status: AC
  Administered 2014-07-25: 40 [IU] via SUBCUTANEOUS
  Filled 2014-07-25: qty 0.4

## 2014-07-25 MED ORDER — INSULIN DETEMIR 100 UNIT/ML ~~LOC~~ SOLN
20.0000 [IU] | Freq: Every day | SUBCUTANEOUS | Status: DC
  Filled 2014-07-25: qty 0.2

## 2014-07-25 MED ORDER — FUROSEMIDE 10 MG/ML IJ SOLN
40.0000 mg | Freq: Once | INTRAMUSCULAR | Status: AC
Start: 2014-07-25 — End: 2014-07-25
  Administered 2014-07-25: 40 mg via INTRAVENOUS
  Filled 2014-07-25: qty 4

## 2014-07-25 MED ORDER — ALBUTEROL SULFATE (2.5 MG/3ML) 0.083% IN NEBU
2.5000 mg | INHALATION_SOLUTION | Freq: Four times a day (QID) | RESPIRATORY_TRACT | Status: DC | PRN
  Administered 2014-07-25: 2.5 mg via RESPIRATORY_TRACT
  Filled 2014-07-25: qty 3

## 2014-07-25 MED ORDER — ENOXAPARIN SODIUM 40 MG/0.4ML ~~LOC~~ SOLN
40.0000 mg | Freq: Every day | SUBCUTANEOUS | Status: DC
  Administered 2014-07-25 – 2014-07-29 (×5): 40 mg via SUBCUTANEOUS
  Filled 2014-07-25 (×6): qty 0.4

## 2014-07-25 MED FILL — Heparin Sodium (Porcine) Inj 1000 Unit/ML: INTRAMUSCULAR | Qty: 20 | Status: AC

## 2014-07-25 MED FILL — Sodium Chloride IV Soln 0.9%: INTRAVENOUS | Qty: 2000 | Status: AC

## 2014-07-25 MED FILL — Lidocaine HCl IV Inj 20 MG/ML: INTRAVENOUS | Qty: 10 | Status: AC

## 2014-07-25 MED FILL — Mannitol IV Soln 20%: INTRAVENOUS | Qty: 500 | Status: AC

## 2014-07-25 MED FILL — Magnesium Sulfate Inj 50%: INTRAMUSCULAR | Qty: 10 | Status: AC

## 2014-07-25 MED FILL — Heparin Sodium (Porcine) Inj 1000 Unit/ML: INTRAMUSCULAR | Qty: 30 | Status: AC

## 2014-07-25 MED FILL — Potassium Chloride Inj 2 mEq/ML: INTRAVENOUS | Qty: 40 | Status: AC

## 2014-07-25 MED FILL — Sodium Bicarbonate IV Soln 8.4%: INTRAVENOUS | Qty: 50 | Status: AC

## 2014-07-25 MED FILL — Electrolyte-R (PH 7.4) Solution: INTRAVENOUS | Qty: 4000 | Status: AC

## 2014-07-25 NOTE — Progress Notes (Signed)
1 Day Post-Op Procedure(s) (LRB): CORONARY ARTERY BYPASS GRAFTING (CABG), ON PUMP, TIMES TWO, USING LEFT INTERNAL MAMMARY ARTERY, LEFT GREATER SAPHENOUS VEIN HARVESTED ENDOSCOPICALLY (N/A) AORTIC VALVE REPLACEMENT (AVR) (N/A) TRANSESOPHAGEAL ECHOCARDIOGRAM (TEE) (N/A) Subjective: C/o incisional pain Nausea earlier, none at present  Objective: Vital signs in last 24 hours: Temp:  [95.9 F (35.5 C)-98.4 F (36.9 C)] 98.2 F (36.8 C) (02/11 0700) Pulse Rate:  [88-92] 90 (02/11 0700) Cardiac Rhythm:  [-] Atrial paced (02/11 0600) Resp:  [11-26] 23 (02/11 0700) BP: (82-141)/(56-98) 111/73 mmHg (02/11 0700) SpO2:  [90 %-100 %] 97 % (02/11 0700) Arterial Line BP: (73-162)/(55-99) 145/69 mmHg (02/11 0700) FiO2 (%):  [40 %-50 %] 40 % (02/10 1900) Weight:  [263 lb 12.8 oz (119.659 kg)] 263 lb 12.8 oz (119.659 kg) (02/11 0500)  Hemodynamic parameters for last 24 hours: PAP: (33-59)/(13-37) 36/14 mmHg CO:  [3.5 L/min-7.9 L/min] 7.9 L/min CI:  [1.6 L/min/m2-3.7 L/min/m2] 3.7 L/min/m2  Intake/Output from previous day: 02/10 0701 - 02/11 0700 In: 5973.5 [I.V.:3923.5; Blood:400; IV Piggyback:1650] Out: 4090 [Urine:2820; Blood:850; Chest Tube:420] Intake/Output this shift:    General appearance: alert and no distress Neurologic: intact Heart: regular rate and rhythm and + rub Lungs: diminished breath sounds bibasilar L>R Abdomen: soft, nontender, hypoactive BS  Lab Results:  Recent Labs  07/24/14 2000 07/25/14 0355  WBC 14.4* 16.3*  HGB 8.9* 8.8*  HCT 26.0* 26.2*  PLT 180 178   BMET:  Recent Labs  07/22/14 1443  07/24/14 1934 07/24/14 2000 07/25/14 0355  NA 135  < > 138  --  138  K 4.2  < > 4.4  --  4.1  CL 100  < > 106  --  108  CO2 27  --   --   --  24  GLUCOSE 109*  < > 122*  --  130*  BUN 17  < > 12  --  9  CREATININE 1.32  < > 0.80 0.94 0.85  CALCIUM 9.3  --   --   --  8.0*  < > = values in this interval not displayed.  PT/INR:  Recent Labs  07/24/14 1400   LABPROT 19.0*  INR 1.58*   ABG    Component Value Date/Time   PHART 7.364 07/24/2014 2111   HCO3 21.2 07/24/2014 2111   TCO2 22 07/24/2014 2111   ACIDBASEDEF 4.0* 07/24/2014 2111   O2SAT 96.0 07/24/2014 2111   CBG (last 3)   Recent Labs  07/25/14 0502 07/25/14 0601 07/25/14 0647  GLUCAP 86 143* 140*    Assessment/Plan: S/P Procedure(s) (LRB): CORONARY ARTERY BYPASS GRAFTING (CABG), ON PUMP, TIMES TWO, USING LEFT INTERNAL MAMMARY ARTERY, LEFT GREATER SAPHENOUS VEIN HARVESTED ENDOSCOPICALLY (N/A) AORTIC VALVE REPLACEMENT (AVR) (N/A) TRANSESOPHAGEAL ECHOCARDIOGRAM (TEE) (N/A) See progression orders   POD # 1 AVR/CABG x2  CV- good hemodynamics- dc swan  Resume coreg  ASA, statin  Will not plan to use coumadin unless he has postop a fib  RESP- left lower lobe atelectasis  IS, will add flutter  RENAL- lytes, creatinine OK- diurese  ENDO- no history of DM but relatively high insulin requirement  Transition to levemir + SSI q4  Anemia- secondary to ABL- no need for transfusion, follow  DVT prophylaxis- SCD + enoxaparin, mobilize    LOS: 1 day    Donald Escobar C 07/25/2014

## 2014-07-25 NOTE — Progress Notes (Signed)
CT surgery p.m. Rounds  Status post aVR CABG Patient currently sitting up in chair, sinus rhythm Weight up following surgery 15 pounds-Will re-dose IV Lasix this p.m. P.m. labs reviewed and are satisfactory except for blood sugar greater than 200 which triggered resuming IV insulin protocol

## 2014-07-25 NOTE — Progress Notes (Signed)
UR Completed.  [phone]  

## 2014-07-25 NOTE — Progress Notes (Signed)
EKG CRITICAL VALUE     12 lead EKG performed.  Critical value noted. Allen Derry, RN notified.   Yehuda Mao, CCT 07/25/2014 7:28 AM

## 2014-07-25 NOTE — Op Note (Signed)
NAMEDEMONTEZ, NOVACK NO.:  [id number]  MEDICAL RECORD NO.:  [id number]  LOCATION:  2S08C                        FACILITY:  [facility]  PHYSICIAN:  Revonda Standard. Roxan Hockey, M.D.DATE OF BIRTH:  [birth year]  DATE OF PROCEDURE:  07/24/2014 DATE OF DISCHARGE:                              OPERATIVE REPORT   PREOPERATIVE DIAGNOSES:  Severe single-vessel coronary disease and moderate-to-severe aortic insufficiency.  POSTOPERATIVE DIAGNOSES:  Severe single-vessel coronary disease and moderate-to-severe aortic insufficiency.  PROCEDURE:  Median sternotomy, extracorporeal circulation Coronary artery bypass grafting x2   Left internal mammary artery to left anterior descending  Saphenous vein graft to first diagonal Endoscopic vein harvest left thigh Aortic valve replacement with 25 mm Edwards Bovine pericardial tissue valve, (Model 2700, serial [id number]).  SURGEON:  Revonda Standard. Roxan Hockey, M.D.  ASSISTANT:  Chaska Giovanni, PA-C.  ANESTHESIA:  General.  FINDINGS:  Prebypass transesophageal echocardiography revealed dilated, hypertrophied LV with severe aortic insufficiency and moderate left ventricular dysfunction.  Aortic valve was pseudo bicuspid with partial fusion of left and right cusp.  Leaflets were heavily calcified.  There was mild annular calcification.  LAD and diagonal good quality targets. Mammary and saphenous vein good quality conduits.  Postbypass transesophageal echocardiography showed good function of the prosthetic valve with no perivalvular leaks and no change in left ventricular function.  CLINICAL NOTE:  Mr. Winthrop is a 59 year old gentleman, who recently was admitted with palpitations and shortness of breath.  He was found to have a mildly elevated troponin and was found to be in congestive heart failure.  Workup revealed moderately severe aortic insufficiency and severe single-vessel coronary disease involving the LAD and diagonal at the  bifurcation.  He also had impaired left ventricular function with ejection fraction of 25%.  His aorta was modestly enlarged at around 4 cm on CT scan.  The patient was advised to undergo aortic valve replacement and coronary bypass grafting.  The indications, risks, benefits, and alternatives were discussed in detail with the patient. He understood and accepted the risk and agreed to proceed.  We did discuss the use of mechanical versus tissue valve and relative advantages and disadvantages of each approach.  He understood this issue and had a strong desire not to be on lifelong anticoagulation and opted to have a pericardial valve placed.  OPERATIVE NOTE:  Mr. Klutz was brought to the preoperative holding area on July 24, 2014.  There, Anesthesia placed a Swan-Ganz catheter and arterial blood pressure monitoring line.  He was taken to the operating room, anesthetized, and intubated.  Transesophageal echocardiography was performed.  It revealed a dilated and hypertrophied left ventricle with moderately severe aortic insufficiency.  There was trace mitral insufficiency.  Ejection fraction was impaired, but did appear to be  about 40% rather than 25% that the echo had shown on his previous admission.  Intravenous antibiotics were administered.  A Foley catheter was placed.  The chest, abdomen, and legs were prepped and draped in usual sterile fashion.  An incision was made in the mid thigh on the left leg.  The greater saphenous vein was identified and was harvested endoscopically.  It was of good quality.  Simultaneously, a median  sternotomy was performed and the left internal mammary artery was harvested using standard technique. It likewise was a good quality vessel.  2000 units of heparin was administered during the vessel harvest.  The remainder of the full heparin dose was given prior to opening the pericardium.  After heparinizing the patient, the pericardium was opened.   The ascending aorta was inspected.  The aorta was large, but it was proportional, there was no aneurysmal segment. There was normal sino- tubular morphology.  The decision was made to leave the native aorta in place.  There was no indication to replace the aorta or root.  After confirming adequate anticoagulation with ACT measurement, the aorta was cannulated via concentric 2-0 Ethibond pledgeted pursestring sutures.  A dual-stage venous cannula was placed via pursestring suture in the right atrial appendage.  Cardiopulmonary bypass was instituted and the patient was cooled to 28 degrees Celsius.  The coronary arteries were inspected and anastomotic sites were chosen.  The conduits were inspected and cut to length.  A foam pad was placed in the pericardium to insulate the heart and to protect the left phrenic nerve.  A temperature probe was placed in the myocardial septum.  A left ventricular vent was placed via pursestring suture in the right superior pulmonary vein.  A retrograde cardioplegia cannula was placed via pursestring suture in the right atrium and directed in the coronary sinus.  An antegrade cardioplegia cannula was placed in the ascending aorta.  The aorta was crossclamped.  The left ventricle was emptied via the aortic root and left ventricular vents.  Cardiac arrest then was achieved with a combination of cold blood cardioplegia and topical iced saline.  An initial attempt to give antegrade cardioplegia resulted in no aortic root pressure.  Cardioplegia then was given retrograde. 1.5 L was administered, there was a diastolic arrest and septal cooling to 10 degrees Celsius.  A reversed saphenous vein graft then was anastomosed end-to-side to the first diagonal branch to the LAD.  This was a 1.5 mm good quality target.  The vein was of good quality.  It was anastomosed end-to-side with a running 7-0 Prolene suture.  A probe passed easily proximally and distally.  At the  completion of anastomosis, cardioplegia was administered down the graft, there was good flow and good hemostasis.  Next, the left internal mammary artery was brought out through a window in the pericardium.  The distal end was beveled.  It was a 2 mm good quality conduit.  It was anastomosed end-to-side to the distal LAD, which was a 2 mm good quality target.  The end-to-side anastomosis was performed with a running 8-0 Prolene suture.  At the completion of the mammary to LAD anastomosis, the bulldog clamp was briefly removed to inspect for hemostasis.  Rapid septal rewarming was noted.  The bulldog clamp was replaced.  The mammary pedicle was tacked to the epicardial surface of the heart with 6-0 Prolene sutures.  Additional cardioplegia then was administered via the retrograde cannula.  An aortotomy was performed and extended into the noncoronary sinus of Valsalva.  The aortic valve was inspected.  The leaflets were heavily calcified.  It was a pseudo bicuspid valve.  There was partial fusion of the left and right cusps, but the 3 cusps had relatively equal sizes with the noncoronary cusp being only slightly oversized.  The valve leaflets were excised.  There was mild annular calcification.  This was debrided.  Care was taken to contain all calcific debris.  The annulus was copiously irrigated with iced saline.  Of note, additional retrograde cardioplegia was administered at 15-20 minute intervals during the valve replacement.  The annulus sized for a 25 mm Edwards PERIMOUNT Bovine pericardial tissue valve.  The valve was prepared per manufacturer's recommendations.  While that was done, 2-0 Ethibond horizontal mattress sutures were placed with subannular pledgets circumferentially around the annulus.  Eighteen sutures were used. Sutures then were passed through the sewing ring of the valve.  The valve was lowered into place and sutures were sequentially tied periodically spreading  the leaflets to ensure good seating of the valve. The annulus was probed with a fine tip right-angle to ensure that there were no gaps or areas where the valve was not seated.  The coronary ostia were inspected and there was no impingement on either the left main or right coronary ostia.  Rewarming was begun.  The aortotomy was closed in 2 layers.  The first layer was a running 4-0 Prolene horizontal mattress suture, the second layer was a running 4-0 Prolene simple suture.  The cardioplegia cannula was removed from the ascending aorta.  The vein graft was cut to the length and the proximal vein graft anastomosis was performed to a 4.5 mm punch aortotomy with a running 6-0 Prolene suture. At the completion of the proximal anastomosis, the patient was placed in Trendelenburg position.  Lidocaine was administered.  A warm dose of retrograde cardioplegia was administered.  De-airing maneuvers were performed.  After administering the warm dose of retrograde, the bulldog clamp was removed from the left mammary artery.  Lidocaine was administered.  Additional de-airing was performed and the aortic crossclamp was removed.  Total crossclamp time was 106 minutes.  The patient initially fibrillated and required 2 defibrillations with 10 joules and then was in heart block thereafter.  While rewarming was completed, the aortotomy and the proximal and distal anastomoses were inspected for hemostasis.  Epicardial pacing wires were placed on the right ventricle and right atrium and DDD pacing was initiated at 90 beats per minute. The left ventricular vent was removed and additional de-airing was performed with this maneuver.  The retrograde cardioplegia cannula was removed.  When the patient had rewarmed to a core temperature of 37 degrees Celsius, he was weaned from cardiopulmonary bypass on the first attempt.  He did not require inotropic support. The total bypass time was 140 minutes.  The initial  cardiac index was greater than 2 L/minute/meter squared.  A test dose of protamine was administered and was well tolerated.  The atrial and aortic cannulae were removed.  The remainder of the protamine was administered without incident.  The chest was irrigated with warm saline.  Hemostasis was achieved.  The pericardium was reapproximated over the aorta and base of the heart with interrupted 3-0 silk sutures.  It came together easily without kinking the underlying grafts.  Left pleural and  mediastinal chest tubes were placed via separate subcostal incisions.  The sternum was closed with a combination of single and double heavy gauge stainless steel wires. The pectoralis fascia, subcutaneous tissue, and skin were closed in standard fashion.  Postbypass transesophageal echocardiography revealed good function of the prosthetic valve with no perivalvular leaks and no change in left ventricular function.  All sponge, needle, and instrument counts were correct at the end of the procedure.  The patient was taken from the operating room to the surgical intensive care unit in good condition.     Revonda Standard Roxan Hockey, M.D.  SCH/MEDQ  D:  07/24/2014  T:  07/25/2014  Job:  [id number]

## 2014-07-26 ENCOUNTER — Inpatient Hospital Stay (HOSPITAL_COMMUNITY): Payer: BLUE CROSS/BLUE SHIELD

## 2014-07-26 ENCOUNTER — Encounter (HOSPITAL_COMMUNITY): Payer: Self-pay | Admitting: Thoracic Surgery (Cardiothoracic Vascular Surgery)

## 2014-07-26 DIAGNOSIS — Z951 Presence of aortocoronary bypass graft: Secondary | ICD-10-CM

## 2014-07-26 LAB — GLUCOSE, CAPILLARY
GLUCOSE-CAPILLARY: 101 mg/dL — AB (ref 70–99)
GLUCOSE-CAPILLARY: 102 mg/dL — AB (ref 70–99)
GLUCOSE-CAPILLARY: 110 mg/dL — AB (ref 70–99)
GLUCOSE-CAPILLARY: 118 mg/dL — AB (ref 70–99)
GLUCOSE-CAPILLARY: 131 mg/dL — AB (ref 70–99)
GLUCOSE-CAPILLARY: 131 mg/dL — AB (ref 70–99)
GLUCOSE-CAPILLARY: 134 mg/dL — AB (ref 70–99)
GLUCOSE-CAPILLARY: 137 mg/dL — AB (ref 70–99)
GLUCOSE-CAPILLARY: 160 mg/dL — AB (ref 70–99)
GLUCOSE-CAPILLARY: 92 mg/dL (ref 70–99)
Glucose-Capillary: 97 mg/dL (ref 70–99)
Glucose-Capillary: 84 mg/dL (ref 70–99)
Glucose-Capillary: 125 mg/dL — ABNORMAL HIGH (ref 70–99)
Glucose-Capillary: 125 mg/dL — ABNORMAL HIGH (ref 70–99)
Glucose-Capillary: 162 mg/dL — ABNORMAL HIGH (ref 70–99)

## 2014-07-26 LAB — CBC
HCT: 27.1 % — ABNORMAL LOW (ref 39.0–52.0)
Hemoglobin: 8.9 g/dL — ABNORMAL LOW (ref 13.0–17.0)
MCH: 30.8 pg (ref 26.0–34.0)
MCHC: 32.8 g/dL (ref 30.0–36.0)
MCV: 93.8 fL (ref 78.0–100.0)
Platelets: 171 10*3/uL (ref 150–400)
RBC: 2.89 MIL/uL — ABNORMAL LOW (ref 4.22–5.81)
RDW: 14.6 % (ref 11.5–15.5)
WBC: 18.2 10*3/uL — ABNORMAL HIGH (ref 4.0–10.5)

## 2014-07-26 LAB — BASIC METABOLIC PANEL
Anion gap: 7 (ref 5–15)
BUN: 11 mg/dL (ref 6–23)
CALCIUM: 8.5 mg/dL (ref 8.4–10.5)
CO2: 26 mmol/L (ref 19–32)
Chloride: 102 mmol/L (ref 96–112)
Creatinine, Ser: 0.93 mg/dL (ref 0.50–1.35)
GFR calc Af Amer: >90 mL/min (ref >90)
GFR calc non Af Amer: 89 mL/min — ABNORMAL LOW (ref >90)
GLUCOSE: 121 mg/dL — AB (ref 70–99)
Potassium: 3.9 mmol/L (ref 3.5–5.1)
Sodium: 135 mmol/L (ref 135–145)

## 2014-07-26 MED ORDER — INSULIN ASPART 100 UNIT/ML ~~LOC~~ SOLN: 0.0000 [IU] | Freq: Every day | SUBCUTANEOUS | Status: DC

## 2014-07-26 MED ORDER — MOVING RIGHT ALONG BOOK
Freq: Once | Status: AC
  Administered 2014-07-28: 13:00:00
  Filled 2014-07-26 (×2): qty 1

## 2014-07-26 MED ORDER — MAGNESIUM OXIDE 400 (241.3 MG) MG PO TABS
400.0000 mg | ORAL_TABLET | Freq: Every day | ORAL | Status: DC
  Administered 2014-07-26 – 2014-07-30 (×5): 400 mg via ORAL
  Filled 2014-07-26 (×5): qty 1

## 2014-07-26 MED ORDER — INSULIN DETEMIR 100 UNIT/ML ~~LOC~~ SOLN
25.0000 [IU] | Freq: Every day | SUBCUTANEOUS | Status: DC
  Administered 2014-07-26: 25 [IU] via SUBCUTANEOUS
  Filled 2014-07-26 (×2): qty 0.25

## 2014-07-26 MED ORDER — AMIODARONE LOAD VIA INFUSION
150.0000 mg | Freq: Once | INTRAVENOUS | Status: AC
  Administered 2014-07-26: 150 mg via INTRAVENOUS

## 2014-07-26 MED ORDER — ALPRAZOLAM 0.25 MG PO TABS
0.2500 mg | ORAL_TABLET | Freq: Four times a day (QID) | ORAL | Status: DC | PRN
  Administered 2014-07-27: 0.25 mg via ORAL
  Filled 2014-07-26: qty 1

## 2014-07-26 MED ORDER — MAGNESIUM HYDROXIDE 400 MG/5ML PO SUSP
30.0000 mL | Freq: Every day | ORAL | Status: DC | PRN
  Filled 2014-07-26: qty 30

## 2014-07-26 MED ORDER — AMIODARONE HCL IN DEXTROSE 360-4.14 MG/200ML-% IV SOLN
INTRAVENOUS | Status: AC
Start: 2014-07-26 — End: 2014-07-26
  Administered 2014-07-26: 200 mL via INTRAVENOUS
  Filled 2014-07-26: qty 200

## 2014-07-26 MED ORDER — AMIODARONE HCL IN DEXTROSE 360-4.14 MG/200ML-% IV SOLN
30.0000 mg/h | INTRAVENOUS | Status: DC
  Administered 2014-07-26 – 2014-07-28 (×4): 30 mg/h via INTRAVENOUS
  Filled 2014-07-26 (×13): qty 200

## 2014-07-26 MED ORDER — ALBUTEROL SULFATE HFA 108 (90 BASE) MCG/ACT IN AERS: 2.0000 | INHALATION_SPRAY | Freq: Four times a day (QID) | RESPIRATORY_TRACT | Status: DC | PRN

## 2014-07-26 MED ORDER — INSULIN ASPART 100 UNIT/ML ~~LOC~~ SOLN
0.0000 [IU] | Freq: Three times a day (TID) | SUBCUTANEOUS | Status: DC
Start: 2014-07-26 — End: 2014-07-27
  Administered 2014-07-26: 4 [IU] via SUBCUTANEOUS

## 2014-07-26 MED ORDER — AMIODARONE HCL IN DEXTROSE 360-4.14 MG/200ML-% IV SOLN
60.0000 mg/h | INTRAVENOUS | Status: AC
  Administered 2014-07-26: 200 mL via INTRAVENOUS
  Filled 2014-07-26: qty 200

## 2014-07-26 MED ORDER — INSULIN ASPART 100 UNIT/ML ~~LOC~~ SOLN
4.0000 [IU] | Freq: Three times a day (TID) | SUBCUTANEOUS | Status: DC
  Administered 2014-07-26: 4 [IU] via SUBCUTANEOUS

## 2014-07-26 MED ORDER — GUAIFENESIN ER 600 MG PO TB12
1200.0000 mg | ORAL_TABLET | Freq: Two times a day (BID) | ORAL | Status: DC
  Administered 2014-07-26 – 2014-07-30 (×9): 1200 mg via ORAL
  Filled 2014-07-26 (×11): qty 2

## 2014-07-26 MED ORDER — SODIUM CHLORIDE 0.9 % IV SOLN: 250.0000 mL | INTRAVENOUS | Status: DC | PRN

## 2014-07-26 MED ORDER — SODIUM CHLORIDE 0.9 % IJ SOLN: 3.0000 mL | INTRAMUSCULAR | Status: DC | PRN

## 2014-07-26 MED ORDER — SODIUM CHLORIDE 0.9 % IJ SOLN
3.0000 mL | Freq: Two times a day (BID) | INTRAMUSCULAR | Status: DC
  Administered 2014-07-26 – 2014-07-30 (×8): 3 mL via INTRAVENOUS

## 2014-07-26 MED ORDER — FUROSEMIDE 40 MG PO TABS
40.0000 mg | ORAL_TABLET | Freq: Every day | ORAL | Status: DC
  Administered 2014-07-26: 40 mg via ORAL
  Filled 2014-07-26 (×2): qty 1

## 2014-07-26 MED ORDER — ALUM & MAG HYDROXIDE-SIMETH 200-200-20 MG/5ML PO SUSP: 15.0000 mL | ORAL | Status: DC | PRN

## 2014-07-26 MED ORDER — FENTANYL CITRATE 0.05 MG/ML IJ SOLN: 50.0000 ug | Freq: Two times a day (BID) | INTRAMUSCULAR | Status: DC | PRN

## 2014-07-26 MED ORDER — POTASSIUM CHLORIDE CRYS ER 20 MEQ PO TBCR
20.0000 meq | EXTENDED_RELEASE_TABLET | Freq: Two times a day (BID) | ORAL | Status: DC
  Administered 2014-07-26 (×2): 20 meq via ORAL
  Filled 2014-07-26 (×4): qty 1

## 2014-07-26 NOTE — Discharge Summary (Signed)
Physician Discharge Summary  Patient ID: Donald Escobar MRN: [id number] DOB/AGE: February 23, 1956 59 y.o.  Admit date: 07/24/2014 Discharge date: 07/30/2014  Admission Diagnoses:  Patient Active Problem List   Diagnosis Date Noted  . Morbid obesity   . Hyperlipemia   . Abnormal CT scan   . Palpitations   . CAD (coronary artery disease)   . Hypertension   . Asthma   . Non Hodgkin's lymphoma   . Aortic insufficiency 07/14/2014  . Acute systolic heart failure 78/29/5621  . NSTEMI (non-ST elevated myocardial infarction) 07/11/2014  . Lung nodule < 6cm on CT 07/11/2014  . Adenoma of left adrenal gland 07/11/2014  . Congestive heart disease    Discharge Diagnoses:   Patient Active Problem List   Diagnosis Date Noted  . S/P CABG x 2 07/26/2014  . S/P AVR 07/24/2014  . Morbid obesity   . Hyperlipemia   . Abnormal CT scan   . Palpitations   . CAD (coronary artery disease)   . Hypertension   . Asthma   . Non Hodgkin's lymphoma   . Aortic insufficiency 07/14/2014  . Acute systolic heart failure 30/86/5784  . NSTEMI (non-ST elevated myocardial infarction) 07/11/2014  . Lung nodule < 6cm on CT 07/11/2014  . Adenoma of left adrenal gland 07/11/2014  . Congestive heart disease    Discharged Condition: good  History of Present Illness:   [name] is a 59 yo male with known history of asthma, hypertension, and Non-Hodgkin Lymphoma (2003).  The patient presented for evaluation with complaints of a 2-3 week course of dyspnea on exertion.  He also complains of palpitations that occur intermittently and last about 20 minutes.  He initially presented to the nurse at his work who recommended he undergo evaluation.  EKG was obtained and was abnormal.  Therefore, he was subsequently referred to a local urgent care.  They later sent the patient to the ED.  Upon arrival he was found to have a mildly elevated Troponin at 0.13 and an elevated BNP.  He was subsequently admitted for diuresis and further  treatment.   During hospitalization the patient underwent TTE which showed aortic insufficiency and a reduced EF.  It was also felt he should undergo cardiac catheterization which was done on 07/15/2014 which showed 2 vessel CAD.  It was felt [facility] consult should be obtained for possible intervention on the Aortic Valve as well as bypass surgery.  He was taken to the CT scan to evaluated aorta to assess for aneurysmal dilatation.  He was felt stable for discharge and was discharged home with follow up at [facility].  He was evaluated by Dr. Roxan Hockey who felt patient would require surgical intervention on his Aortic valve and coronary arteries.  The risks and benefits of the procedure were explained to the patient and he was agreeable to proceed.      Hospital Course:   [name] presented to [facility] on 07/24/2014.  He was taken to the operating room and underwent CABG x 2 utilizing LIMA to LAD, and SVG to Diagonal.  He also underwent AVR with a 25 mm Perimount Bovine Pericardial Tissue valve.  Finally he underwent Endoscopic saphenous vein harvest from his left thigh.  He tolerated the procedure without difficulty and was taken to the SICU in stable condition.  He was extubated on the evening of surgery. During his stay in the SICU the patient was weaned off NTG as tolerated.  His chest tubes and arterial lines were  removed without difficulty.  He was hypervolemic and diuresed with IV lasix.  The patient had no history of DM (pre op HGA1C was 5.7) , but required high insulin requirements with IV drip.  He  was weaned off Insulin drip. He is pre diabetic and will need to follow a diabetic diet and have follow up with his medical doctor after discharge. He did have ABL anemia. His last H and H was 8.7 and 26.4. He did not require a post op transfusion. He was surgically stable for transfer from the ICU to PCTU on 07/26/2014. He was volume overloaded and diuresed accordingly. He initially was requiring 3  liters of oxygen via [place], but was weaned to room air.  The patient developed Atrial Fibrillation, which responded to treatment with Amiodarone.  However, he had several more episodes and was ultimately placed on Coumadin.  His INR today is 1.34.  He will be discharged on 7.5 mg daily with a goal range of 2.0-3.0.  He is currently maintaining NSR. He has been tolerating a diet and has had a bowel movement.  He is felt medically stable for discharge to SNF today 07/30/2014.  Significant Diagnostic Studies: angiography:  HEMODYNAMICS:   AO SYSTOLIC/AO DIASTOLIC: 323/55  LV SYSTOLIC/LV DIASTOLIC: 732/20  ANGIOGRAPHIC RESULTS:   1. Left main; normal  2. LAD; 90% proximal at the takeoff of a moderate-sized diagonal branch 3. Left circumflex; dominant and normal.  4. Right coronary artery; nondominant and normal 5.LIMA was subselectively visualized was widely patent. It was suitable for use during coronary artery bypass grafting 7. Left ventriculography;was not performed today. 8. Supravalvular aortogram was performed using 20 mL of Visipaque dye at 20 mL/s. The descending thoracic aorta was mildly dilated. There was 2+ angiographic aortic insufficiency.  ECHO:  - Left ventricle: Hypertrophy was noted. Systolic function was mildly to moderately reduced. The estimated ejection fraction was in the range of 40% to 45%. Hypokinesis of the lateral myocardium. Hypokinesis of the inferolateral myocardium. - Aortic valve: There was moderate to severe regurgitation originating from the central coaptation point and directed towards the mitral anterior leaflet. - Mitral valve: There was mild regurgitation. - Atrial septum: No defect or patent foramen ovale was identified. Echo contrast study showed no right-to-left atrial level shunt, following an increase in RA pressure induced by provocative maneuvers. - Tricuspid valve: No evidence of vegetation. - Pulmonic valve: No evidence  of vegetation.  Treatments: surgery:   Median sternotomy, extracorporeal circulation, coronary artery bypass grafting x2 (left internal mammary artery to left anterior descending, saphenous vein graft to first diagonal) endoscopic vein harvest, left thigh, aortic valve replacement with 25 mm Edwards Bovine pericardial tissue valve, model 2700, serial #[id number].  Disposition: 01-Home or Self Care   Discharge Medications:    Medication List    STOP taking these medications        nitroGLYCERIN 0.4 MG SL tablet  Commonly known as:  NITROSTAT      TAKE these medications        albuterol 108 (90 BASE) MCG/ACT inhaler  Commonly known as:  PROVENTIL HFA;VENTOLIN HFA  Inhale 2 puffs into the lungs every 6 (six) hours as needed for wheezing or shortness of breath.     amiodarone 400 MG tablet  Commonly known as:  PACERONE  Take 1 tablet (400 mg total) by mouth 3 (three) times daily.     aspirin 81 MG EC tablet  Take 1 tablet (81 mg total) by mouth daily.  atorvastatin 40 MG tablet  Commonly known as:  LIPITOR  Take 1 tablet (40 mg total) by mouth every evening.     carvedilol 6.25 MG tablet  Commonly known as:  COREG  Take 1 tablet (6.25 mg total) by mouth 2 (two) times daily with a meal.     furosemide 40 MG tablet  Commonly known as:  LASIX  Take 1 tablet (40 mg total) by mouth daily.     oxyCODONE 5 MG immediate release tablet  Commonly known as:  Oxy IR/ROXICODONE  Take 1-2 tablets (5-10 mg total) by mouth every 3 (three) hours as needed for severe pain.     potassium chloride SA 20 MEQ tablet  Commonly known as:  K-DUR,KLOR-CON  Take 1 tablet (20 mEq total) by mouth daily.     warfarin 7.5 MG tablet  Commonly known as:  COUMADIN  Take 1 tablet (7.5 mg total) by mouth daily at 6 PM.         The patient has been discharged on:   1.Beta Blocker:  Yes [ x  ]                              No   [   ]                              If No, reason:  2.Ace  Inhibitor/ARB: Yes [   ]                                     No  [x    ]                                     If No, reason: labile blood pressure  3.Statin:   Yes [ x  ]                  No  [   ]                  If No, reason:  4.Ecasa:  Yes  [ x  ]                  No   [   ]                  If No, reason:     Follow-up Information    Follow up with Melrose Nakayama, MD On 08/20/2014.   Specialty:  Cardiothoracic Surgery   Why:  Appointment is at 11:00   Contact information:   [address] [place] [address] [phone]       Follow up with [facility] On 08/20/2014.   Why:  Please get CXR 1 hour prior to your appointment with Dr. Ollen Gross information:   [facility]       Signed: Ellwood Handler 07/30/2014, 12:13 PM

## 2014-07-26 NOTE — Progress Notes (Signed)
1540 Cardiac Rehab Attempted to get pt to ambulate. Pt in recliner c/o of nausea. States that he is so sick he feels that he can not walk. He has not walked today. Pt had not reported to nurse that he was nauseated. I reported to nurse and encouraged him to try to walk later today when he was feeling better. He states " I want to walk." We will follow pt tomorrow. Deon Pilling, RN 07/26/2014 4:00 PM

## 2014-07-26 NOTE — Progress Notes (Signed)
2 Days Post-Op Procedure(s) (LRB): CORONARY ARTERY BYPASS GRAFTING (CABG), ON PUMP, TIMES TWO, USING LEFT INTERNAL MAMMARY ARTERY, LEFT GREATER SAPHENOUS VEIN HARVESTED ENDOSCOPICALLY (N/A) AORTIC VALVE REPLACEMENT (AVR) (N/A) TRANSESOPHAGEAL ECHOCARDIOGRAM (TEE) (N/A) Subjective: Some incisional pain Requesting magnesium which he takes for migraines  Objective: Vital signs in last 24 hours: Temp:  [98.2 F (36.8 C)-98.8 F (37.1 C)] 98.8 F (37.1 C) (02/12 0741) Pulse Rate:  [80-111] 88 (02/12 0700) Cardiac Rhythm:  [-] Normal sinus rhythm (02/11 2000) Resp:  [13-28] 24 (02/12 0700) BP: (87-157)/(52-97) 130/84 mmHg (02/12 0700) SpO2:  [93 %-100 %] 97 % (02/12 0700) Arterial Line BP: (119-160)/(55-97) 149/97 mmHg (02/11 1600) Weight:  [258 lb 2.5 oz (117.1 kg)] 258 lb 2.5 oz (117.1 kg) (02/12 0500)  Hemodynamic parameters for last 24 hours: PAP: (29-47)/(10-22) 29/10 mmHg  Intake/Output from previous day: 02/11 0701 - 02/12 0700 In: 1491.6 [P.O.:480; I.V.:811.6; IV Piggyback:200] Out: 2120 [Urine:2070; Chest Tube:50] Intake/Output this shift:    General appearance: alert and no distress Neurologic: intact Heart: regular rate and rhythm Lungs: diminished breath sounds bases L>R Abdomen: normal findings: soft, non-tender  Lab Results:  Recent Labs  07/25/14 1631 07/26/14 0420  WBC 22.7* 18.2*  HGB 9.6*  10.2* 8.9*  HCT 28.5*  30.0* 27.1*  PLT 211 171   BMET:  Recent Labs  07/25/14 0355  07/25/14 1631 07/26/14 0420  NA 138  --  135 135  K 4.1  --  4.2 3.9  CL 108  --  101 102  CO2 24  --   --  26  GLUCOSE 130*  --  217* 121*  BUN 9  --  11 11  CREATININE 0.85  < > 1.05  0.90 0.93  CALCIUM 8.0*  --   --  8.5  < > = values in this interval not displayed.  PT/INR:  Recent Labs  07/24/14 1400  LABPROT 19.0*  INR 1.58*   ABG    Component Value Date/Time   PHART 7.364 07/24/2014 2111   HCO3 21.2 07/24/2014 2111   TCO2 20 07/25/2014 1631   ACIDBASEDEF 4.0* 07/24/2014 2111   O2SAT 96.0 07/24/2014 2111   CBG (last 3)   Recent Labs  07/25/14 2213 07/25/14 2258 07/26/14 0014  GLUCAP 118* 97 137*    Assessment/Plan: S/P Procedure(s) (LRB): CORONARY ARTERY BYPASS GRAFTING (CABG), ON PUMP, TIMES TWO, USING LEFT INTERNAL MAMMARY ARTERY, LEFT GREATER SAPHENOUS VEIN HARVESTED ENDOSCOPICALLY (N/A) AORTIC VALVE REPLACEMENT (AVR) (N/A) TRANSESOPHAGEAL ECHOCARDIOGRAM (TEE) (N/A) -  CV- POD # 2 AVR, CABG x 2  Pericardial valve- will use ASA only unless he has atrial fib postop  ASA, statin, beta blocker- coreg due to LV dysfunction  RESP_ LLL atelectasis +/- effusion  IS, flutter, will add humibid  RENAL_ creatinine and lytes OK- continue diuresis  ENDO- CBG better with insulin gtt, will transition to levemir + SSI  Transfer to PTCU  Enoxaparin + SCD for DVT prophylaxis  Ambulate   LOS: 2 days    Erdem Naas C 07/26/2014

## 2014-07-27 ENCOUNTER — Inpatient Hospital Stay (HOSPITAL_COMMUNITY): Payer: BLUE CROSS/BLUE SHIELD

## 2014-07-27 LAB — CBC
HCT: 26.4 % — ABNORMAL LOW (ref 39.0–52.0)
HEMOGLOBIN: 8.7 g/dL — AB (ref 13.0–17.0)
MCH: 30.4 pg (ref 26.0–34.0)
MCHC: 33 g/dL (ref 30.0–36.0)
MCV: 92.3 fL (ref 78.0–100.0)
Platelets: 156 10*3/uL (ref 150–400)
RBC: 2.86 MIL/uL — ABNORMAL LOW (ref 4.22–5.81)
RDW: 14.6 % (ref 11.5–15.5)
WBC: 13.7 10*3/uL — ABNORMAL HIGH (ref 4.0–10.5)

## 2014-07-27 LAB — BASIC METABOLIC PANEL
ANION GAP: 5 (ref 5–15)
BUN: 17 mg/dL (ref 6–23)
CALCIUM: 8.2 mg/dL — AB (ref 8.4–10.5)
CO2: 30 mmol/L (ref 19–32)
CREATININE: 1 mg/dL (ref 0.50–1.35)
Chloride: 100 mmol/L (ref 96–112)
GFR calc Af Amer: >90 mL/min (ref >90)
GFR calc non Af Amer: 80 mL/min — ABNORMAL LOW (ref >90)
GLUCOSE: 107 mg/dL — AB (ref 70–99)
Potassium: 4.5 mmol/L (ref 3.5–5.1)
Sodium: 135 mmol/L (ref 135–145)

## 2014-07-27 LAB — GLUCOSE, CAPILLARY
Glucose-Capillary: 101 mg/dL — ABNORMAL HIGH (ref 70–99)
Glucose-Capillary: 118 mg/dL — ABNORMAL HIGH (ref 70–99)

## 2014-07-27 MED ORDER — WARFARIN SODIUM 5 MG PO TABS
5.0000 mg | ORAL_TABLET | Freq: Every day | ORAL | Status: DC
  Administered 2014-07-27 – 2014-07-29 (×3): 5 mg via ORAL
  Filled 2014-07-27 (×4): qty 1

## 2014-07-27 MED ORDER — FUROSEMIDE 10 MG/ML IJ SOLN
40.0000 mg | Freq: Once | INTRAMUSCULAR | Status: AC
  Administered 2014-07-27: 40 mg via INTRAVENOUS
  Filled 2014-07-27: qty 4

## 2014-07-27 MED ORDER — FUROSEMIDE 40 MG PO TABS
40.0000 mg | ORAL_TABLET | Freq: Every day | ORAL | Status: DC
  Filled 2014-07-27: qty 1

## 2014-07-27 MED ORDER — WARFARIN - PHYSICIAN DOSING INPATIENT
Freq: Every day | Status: DC
Start: 2014-07-27 — End: 2014-07-30
  Administered 2014-07-27 – 2014-07-28 (×2)

## 2014-07-27 MED ORDER — POTASSIUM CHLORIDE CRYS ER 20 MEQ PO TBCR
20.0000 meq | EXTENDED_RELEASE_TABLET | Freq: Every day | ORAL | Status: DC
  Administered 2014-07-28 – 2014-07-30 (×3): 20 meq via ORAL
  Filled 2014-07-27 (×4): qty 1

## 2014-07-27 MED ORDER — ASPIRIN EC 81 MG PO TBEC
81.0000 mg | DELAYED_RELEASE_TABLET | Freq: Every day | ORAL | Status: DC
  Administered 2014-07-28 – 2014-07-30 (×3): 81 mg via ORAL
  Filled 2014-07-27 (×3): qty 1

## 2014-07-27 NOTE — Progress Notes (Signed)
Foley d/c at this time; will cont. To monitor. 

## 2014-07-27 NOTE — Progress Notes (Signed)
CARDIAC REHAB PHASE I   PRE:  Rate/Rhythm: 101 Afib  BP:  Supine:   Sitting: 100/69  Standing:    SaO2: 98% 3L O2  MODE:  Ambulation: 200 ft   POST:  Rate/Rhythem: 118 Afib  BP:  Supine:   Sitting: 118/80  Standing:    SaO2: 95% RA  1403-1431 Pt tolerated ambulation well with assist x1. Gait steady, no c/o, VSS. To chair after walk with legs elevated. Encouraged IS use.  Scherrie Bateman

## 2014-07-27 NOTE — Progress Notes (Signed)
Pt encouraged to ambulate again; pt with visitors at bedside; pt stated he would ambulate once visitors left; will cont. To monitor

## 2014-07-27 NOTE — Progress Notes (Addendum)
      [address]       [place] [address]             [phone]        3 Days Post-Op Procedure(s) (LRB): CORONARY ARTERY BYPASS GRAFTING (CABG), ON PUMP, TIMES TWO, USING LEFT INTERNAL MAMMARY ARTERY, LEFT GREATER SAPHENOUS VEIN HARVESTED ENDOSCOPICALLY (N/A) AORTIC VALVE REPLACEMENT (AVR) (N/A) TRANSESOPHAGEAL ECHOCARDIOGRAM (TEE) (N/A)  Subjective: Patient without nausea this am. Ate breakfast. Passing flatus but no bowel movement yet.  Objective: Vital signs in last 24 hours: Temp:  [98.4 F (36.9 C)-98.8 F (37.1 C)] 98.4 F (36.9 C) (02/13 0515) Pulse Rate:  [67-101] 101 (02/13 0515) Cardiac Rhythm:  [-] Atrial fibrillation (02/13 0809) Resp:  [15-22] 18 (02/13 0515) BP: (98-129)/(57-92) 100/60 mmHg (02/13 0515) SpO2:  [96 %-100 %] 97 % (02/13 0515) Weight:  [264 lb 5.3 oz (119.9 kg)] 264 lb 5.3 oz (119.9 kg) (02/13 0641)  Pre op weight 110 kg Current Weight  07/27/14 264 lb 5.3 oz (119.9 kg)      Intake/Output from previous day: 02/12 0701 - 02/13 0700 In: 1332.3 [P.O.:240; I.V.:1092.3] Out: 690 [Urine:690]   Physical Exam:  Cardiovascular: IRRR IRRR Pulmonary: Diminished bilaterally; no rales, wheezes, or rhonchi. Abdomen: Soft, non tender, bowel sounds present. Extremities: Mild bilateral lower extremity edema. Ecchymosis right thigh Wounds: Clean and dry.  No erythema or signs of infection.  Lab Results: CBC: Recent Labs  07/26/14 0420 07/27/14 0510  WBC 18.2* 13.7*  HGB 8.9* 8.7*  HCT 27.1* 26.4*  PLT 171 156   BMET:  Recent Labs  07/25/14 0355  07/25/14 1631 07/26/14 0420  NA 138  --  135 135  K 4.1  --  4.2 3.9  CL 108  --  101 102  CO2 24  --   --  26  GLUCOSE 130*  --  217* 121*  BUN 9  --  11 11  CREATININE 0.85  < > 1.05  0.90 0.93  CALCIUM 8.0*  --   --  8.5  < > = values in this interval not displayed.  PT/INR:  Lab Results  Component Value Date   INR 1.58* 07/24/2014   INR 1.13 07/22/2014   INR  1.10 07/15/2014   ABG:  INR: Will add last result for INR, ABG once components are confirmed Will add last 4 CBG results once components are confirmed  Assessment/Plan:  1. CV - Went into a fib with RVR earlier this am. Remains in a fib with CVR. On Amiodarone drip and Coreg 6.25 bid. Will start Coumadin and decrease ecasa to 81 daily. 2.  Pulmonary - On 3 liters of oxygen via [place]. Wean as tolerates. CXR shows no pneumothorax, improved aeration, mild pulmonary vascular congestion. Encourage incentive spirometer and flutter valve 3. Volume Overload - On Lasix 40 daily. Give IV this am. 4.  Acute blood loss anemia - H and H 8.7 and 26.4. 5.DM-CBGs 131/162/101. On Insulin. Pre op HGA1C 5.7. Stop accu checks and SS. 6.GI-previous nausea but resolved now. 7. Foley still in place-will remove  ZIMMERMAN,DONIELLE MPA-C 07/27/2014,8:42 AM   Chart reviewed, patient examined, agree with above. He remains in A-fib today on IV amio so will start coumadin.

## 2014-07-28 LAB — PROTIME-INR
INR: 1.3 (ref 0.00–1.49)
Prothrombin Time: 16.4 seconds — ABNORMAL HIGH (ref 11.6–15.2)

## 2014-07-28 LAB — BASIC METABOLIC PANEL
ANION GAP: 7 (ref 5–15)
BUN: 16 mg/dL (ref 6–23)
CO2: 28 mmol/L (ref 19–32)
CREATININE: 0.98 mg/dL (ref 0.50–1.35)
Calcium: 8.7 mg/dL (ref 8.4–10.5)
Chloride: 101 mmol/L (ref 96–112)
GFR calc Af Amer: >90 mL/min (ref >90)
GFR, EST NON AFRICAN AMERICAN: 88 mL/min — AB (ref >90)
GLUCOSE: 137 mg/dL — AB (ref 70–99)
Potassium: 4.2 mmol/L (ref 3.5–5.1)
Sodium: 136 mmol/L (ref 135–145)

## 2014-07-28 MED ORDER — PATIENT'S GUIDE TO USING COUMADIN BOOK
Freq: Once | Status: AC
  Administered 2014-07-28: 12:00:00
  Filled 2014-07-28: qty 1

## 2014-07-28 MED ORDER — LACTULOSE 10 GM/15ML PO SOLN
20.0000 g | Freq: Once | ORAL | Status: AC
  Administered 2014-07-28: 20 g via ORAL
  Filled 2014-07-28: qty 30

## 2014-07-28 MED ORDER — AMIODARONE HCL 200 MG PO TABS: 400.0000 mg | ORAL_TABLET | Freq: Two times a day (BID) | ORAL | Status: DC

## 2014-07-28 MED ORDER — FUROSEMIDE 10 MG/ML IJ SOLN
40.0000 mg | Freq: Once | INTRAMUSCULAR | Status: AC
  Administered 2014-07-28: 40 mg via INTRAVENOUS
  Filled 2014-07-28: qty 4

## 2014-07-28 MED ORDER — WARFARIN VIDEO
Freq: Once | Status: AC
  Administered 2014-07-28: 12:00:00

## 2014-07-28 MED ORDER — AMIODARONE HCL 200 MG PO TABS
400.0000 mg | ORAL_TABLET | Freq: Three times a day (TID) | ORAL | Status: DC
  Administered 2014-07-28 – 2014-07-30 (×8): 400 mg via ORAL
  Filled 2014-07-28 (×9): qty 2

## 2014-07-28 NOTE — Progress Notes (Signed)
Pt ambulated 300 feet with RN and rolling walker; steady gait noted; pt ambulated on RA; no c/o pain; no SOB; pt back to room to bed; call bell w/i reach; will cont. To monitor.

## 2014-07-28 NOTE — Progress Notes (Signed)
Pt ambulated 550 feet with rolling walker; steady gait; pt to chair after ambulation; will cont. To monitor.

## 2014-07-28 NOTE — Progress Notes (Signed)
R IJ sleeve d/c at this time; dressing applied; bedrest until 1720; will cont. To monitor.

## 2014-07-28 NOTE — Progress Notes (Signed)
Pt not wanting to ambulate at this time; pt feeling very "hot"; no fevers; cool cloth for forehead given; will cont. To monitor; pt states he has day and night sweats at home.

## 2014-07-28 NOTE — Progress Notes (Signed)
Pt not wanting to ambulate at this time; pt got himself back in bed and wanting to take a nap; will cont. To monitor.

## 2014-07-28 NOTE — Progress Notes (Addendum)
      [address]       [place] [address]             [phone]        4 Days Post-Op Procedure(s) (LRB): CORONARY ARTERY BYPASS GRAFTING (CABG), ON PUMP, TIMES TWO, USING LEFT INTERNAL MAMMARY ARTERY, LEFT GREATER SAPHENOUS VEIN HARVESTED ENDOSCOPICALLY (N/A) AORTIC VALVE REPLACEMENT (AVR) (N/A) TRANSESOPHAGEAL ECHOCARDIOGRAM (TEE) (N/A)  Subjective: Patient with "day sweats" and constipation.  Objective: Vital signs in last 24 hours: Temp:  [97.7 F (36.5 C)-97.8 F (36.6 C)] 97.8 F (36.6 C) (02/14 0509) Pulse Rate:  [84-100] 84 (02/14 0509) Cardiac Rhythm:  [-] Atrial fibrillation (02/14 0829) Resp:  [17-18] 18 (02/14 0509) BP: (100-113)/(7-79) 113/65 mmHg (02/14 0509) SpO2:  [98 %-100 %] 100 % (02/14 0509) Weight:  [258 lb 11.2 oz (117.346 kg)] 258 lb 11.2 oz (117.346 kg) (02/14 0509)  Pre op weight 110 kg Current Weight  07/28/14 258 lb 11.2 oz (117.346 kg)      Intake/Output from previous day: 02/13 0701 - 02/14 0700 In: 750.3 [P.O.:600; I.V.:150.3] Out: 2270 [Urine:2270]   Physical Exam:  Cardiovascular: [facility] Pulmonary: Diminished bilaterally; no rales, wheezes, or rhonchi. Abdomen: Soft, non tender, bowel sounds present. Extremities: Mild bilateral lower extremity edema. Ecchymosis right thigh Wounds: Clean and dry.  No erythema or signs of infection.  Lab Results: CBC:  Recent Labs  07/26/14 0420 07/27/14 0510  WBC 18.2* 13.7*  HGB 8.9* 8.7*  HCT 27.1* 26.4*  PLT 171 156   BMET:   Recent Labs  07/27/14 0510 07/28/14 0440  NA 135 136  K 4.5 4.2  CL 100 101  CO2 30 28  GLUCOSE 107* 137*  BUN 17 16  CREATININE 1.00 0.98  CALCIUM 8.2* 8.7    PT/INR:  Lab Results  Component Value Date   INR 1.30 07/28/2014   INR 1.58* 07/24/2014   INR 1.13 07/22/2014   ABG:  INR: Will add last result for INR, ABG once components are confirmed Will add last 4 CBG results once components are  confirmed  Assessment/Plan:  1. CV - Went into a fib with RVR. Remains in a fib with fairly controlled CVR. On Amiodarone drip,Coreg 6.25 bid, and Coumadin. Will stop Amiodarone drip and give 400 mg tid orally today. INR 1.3 this am. Continue with Coumadin 5 mg daily. 2.  Pulmonary - Was on 3 liters of oxygen via [place]. Weaned to room air.  Encourage incentive spirometer and flutter valve 3. Volume Overload - On Lasix 40 daily. Give IV again this am as had good diuresis with it yesterday 4.  Acute blood loss anemia - H and H 8.7 and 26.4.  5. Lactulose constipation  6. Remove EPW in am  ZIMMERMAN,DONIELLE MPA-C 07/28/2014,8:32 AM    Chart reviewed, patient examined, agree with above.

## 2014-07-29 LAB — PROTIME-INR
INR: 1.29 (ref 0.00–1.49)
Prothrombin Time: 16.2 seconds — ABNORMAL HIGH (ref 11.6–15.2)

## 2014-07-29 MED ORDER — POLYETHYLENE GLYCOL 3350 17 G PO PACK
17.0000 g | PACK | Freq: Every day | ORAL | Status: DC
  Administered 2014-07-29 – 2014-07-30 (×2): 17 g via ORAL
  Filled 2014-07-29 (×2): qty 1

## 2014-07-29 MED ORDER — FUROSEMIDE 40 MG PO TABS
40.0000 mg | ORAL_TABLET | Freq: Every day | ORAL | Status: DC
  Administered 2014-07-29 – 2014-07-30 (×2): 40 mg via ORAL
  Filled 2014-07-29 (×2): qty 1

## 2014-07-29 MED ORDER — LACTULOSE 10 GM/15ML PO SOLN
20.0000 g | Freq: Every day | ORAL | Status: DC | PRN
  Filled 2014-07-29: qty 30

## 2014-07-29 NOTE — Progress Notes (Signed)
CARDIAC REHAB PHASE I   PRE:  Rate/Rhythm: 82 SR  BP:  Supine:   Sitting: 136/88  Standing:    SaO2: 98 RA  MODE:  Ambulation: 890 ft   POST:  Rate/Rhythm: 101 ST  BP:  Supine:   Sitting: 154/98  Standing:    SaO2: 96 RA 0945-1018 Assisted X 1 to ambulate. Gait steady with walker. He was able to walk 890 feet.He had  c/o of numbness on front of right leg and some other aches VS stable. Pt back to recliner after walk with call light in reach.  Rodney Langton RN 07/29/2014 10:14 AM

## 2014-07-29 NOTE — Progress Notes (Signed)
EPW removed per order. Ends intact. VSS. Pt instructed of one hour bedrest. Verbalized understanding. Will continue to monitor pt cloesly. 

## 2014-07-29 NOTE — Discharge Instructions (Addendum)
1. Please obtain vital signs at least one time daily 2.Please weigh the patient daily. If he or she continues to gain weight or develops lower extremity edema, contact the office at [phone]. 3. Ambulate patient at least three times daily and please use sternal precautions. 4. Please check PT/INR on a daily basis.  These results need faxed to [place]: Coumadin Nurse at [phone]    Aortic Valve Replacement, Care After Refer to this sheet in the next few weeks. These instructions provide you with information on caring for yourself after your procedure. Your health care provider may also give you specific instructions. Your treatment has been planned according to current medical practices, but problems sometimes occur. Call your health care provider if you have any problems or questions after your procedure. HOME CARE INSTRUCTIONS   Take medicines only as directed by your health care provider.  If your health care provider has prescribed elastic stockings, wear them as directed.  Take frequent naps or rest often throughout the day.  Avoid lifting over 10 lbs (4.5 kg) or pushing or pulling things with your arms for 6-8 weeks or as directed by your health care provider.  Avoid driving or airplane travel for 4-6 weeks after surgery or as directed by your health care provider. If you are riding in a car for an extended period, stop every 1-2 hours to stretch your legs. Keep a record of your medicines and medical history with you when traveling.  Do not drive or operate heavy machinery while taking pain medicine. (narcotics).  Do not cross your legs.  Do not use any tobacco products including cigarettes, chewing tobacco, or electronic cigarettes. If you need help quitting, ask your health care provider.  Do not take baths, swim, or use a hot tub until your health care provider approves. Take showers once your health care provider approves. Pat incisions dry. Do not rub  incisions with a washcloth or towel.  Avoid climbing stairs and using the handrail to pull yourself up for the first 2-3 weeks after surgery.  Return to work as directed by your health care provider.  Drink enough fluid to keep your urine clear or pale yellow.  Do not strain to have a bowel movement. Eat high-fiber foods if you become constipated. You may also take a medicine to help you have a bowel movement (laxative) as directed by your health care provider.  Resume sexual activity as directed by your health care provider. Men should not use medicines for erectile dysfunction until their doctor says it isokay.  If you had a certain type of heart condition in the past, you may need to take antibiotic medicine before having dental work or surgery. Let your dentist and health care providers know if you had one or more of the following:  Previous endocarditis.  An artificial (prosthetic) heart valve.  Congenital heart disease. SEEK MEDICAL CARE IF:  You develop a skin rash.   You experience sudden changes in your weight.  You have a fever. SEEK IMMEDIATE MEDICAL CARE IF:   You develop chest pain that is not coming from your incision.  You have drainage (pus), redness, swelling, or pain at your incision site.   You develop shortness of breath or have difficulty breathing.   You have increased bleeding from your incision site.   You develop light-headedness.  MAKE SURE YOU:   Understand these directions.  Will watch your condition.  Will get help right away if you are  not doing well or get worse. Document Released: 12/17/2004 Document Revised: 10/15/2013 Document Reviewed: 03/14/2012 [facility] Patient Information 2015 [place], [place]. This information is not intended to replace advice given to you by your health care provider. Make sure you discuss any questions you have with your health care provider.  Endoscopic Saphenous Vein Harvesting Care After Refer to this  sheet in the next few weeks. These instructions provide you with information on caring for yourself after your procedure. Your health care provider may also give you more specific instructions. Your treatment has been planned according to current medical practices, but problems sometimes occur. Call your health care provider if you have any problems or questions after your procedure. HOME CARE INSTRUCTIONS Medicine  Take whatever pain medicine your surgeon prescribes. Follow the directions carefully. Do not take over-the-counter pain medicine unless your surgeon says it is okay. Some pain medicine can cause bleeding problems for several weeks after surgery.  Follow your surgeon's instructions about driving. You will probably not be permitted to drive after heart surgery.  Take any medicines your surgeon prescribes. Any medicines you took before your heart surgery should be checked with your health care provider before you start taking them again. Wound care  If your surgeon has prescribed an elastic bandage or stocking, ask how long you should wear it.  Check the area around your surgical cuts (incisions) whenever your bandages (dressings) are changed. Look for any redness or swelling.  You will need to return to have the stitches (sutures) or staples taken out. Ask your surgeon when to do that.  Ask your surgeon when you can shower or bathe. Activity  Try to keep your legs raised when you are sitting.  Do any exercises your health care providers have given you. These may include deep breathing exercises, coughing, walking, or other exercises. SEEK MEDICAL CARE IF:  You have any questions about your medicines.  You have more leg pain, especially if your pain medicine stops working.  New or growing bruises develop on your leg.  Your leg swells, feels tight, or becomes red.  You have numbness in your leg. SEEK IMMEDIATE MEDICAL CARE IF:  Your pain gets much worse.  Blood or fluid  leaks from any of the incisions.  Your incisions become warm, swollen, or red.  You have chest pain.  You have trouble breathing.  You have a fever.  You have more pain near your leg incision. MAKE SURE YOU:  Understand these instructions.  Will watch your condition.  Will get help right away if you are not doing well or get worse. Document Released: 02/10/2011 Document Revised: 06/05/2013 Document Reviewed: 02/10/2011 [facility] Patient Information 2015 [place], [place]. This information is not intended to replace advice given to you by your health care provider. Make sure you discuss any questions you have with your health care provider.  _____________________________________  Information on my medicine - Coumadin   (Warfarin)  This medication education was reviewed with me or my healthcare representative as part of my discharge preparation.   Why was Coumadin prescribed for you? Coumadin was prescribed for you because you have a blood clot or a medical condition that can cause an increased risk of forming blood clots. Blood clots can cause serious health problems by blocking the flow of blood to the heart, lung, or brain. Coumadin can prevent harmful blood clots from forming. As a reminder your indication for Coumadin is:   Blood Clot Prevention After Heart Valve Surgery  What test  will check on my response to Coumadin? While on Coumadin (warfarin) you will need to have an INR test regularly to ensure that your dose is keeping you in the desired range. The INR (international normalized ratio) number is calculated from the result of the laboratory test called prothrombin time (PT).  If an INR APPOINTMENT HAS NOT ALREADY BEEN MADE FOR YOU please schedule an appointment to have this lab work done by your health care provider within 7 days. Your INR goal is usually a number between:  2 to 3 or your provider may give you a more narrow range like 2-2.5.  Ask your health care provider  during an office visit what your goal INR is.  What  do you need to  know  About  COUMADIN? Take Coumadin (warfarin) exactly as prescribed by your healthcare provider about the same time each day.  DO NOT stop taking without talking to the doctor who prescribed the medication.  Stopping without other blood clot prevention medication to take the place of Coumadin may increase your risk of developing a new clot or stroke.  Get refills before you run out.  What do you do if you miss a dose? If you miss a dose, take it as soon as you remember on the same day then continue your regularly scheduled regimen the next day.  Do not take two doses of Coumadin at the same time.  Important Safety Information A possible side effect of Coumadin (Warfarin) is an increased risk of bleeding. You should call your healthcare provider right away if you experience any of the following: ? Bleeding from an injury or your nose that does not stop. ? Unusual colored urine (red or dark brown) or unusual colored stools (red or black). ? Unusual bruising for unknown reasons. ? A serious fall or if you hit your head (even if there is no bleeding).  Some foods or medicines interact with Coumadin (warfarin) and might alter your response to warfarin. To help avoid this: ? Eat a balanced diet, maintaining a consistent amount of Vitamin K. ? Notify your provider about major diet changes you plan to make. ? Avoid alcohol or limit your intake to 1 drink for women and 2 drinks for men per day. (1 drink is 5 oz. wine, 12 oz. beer, or 1.5 oz. liquor.)  Make sure that ANY health care provider who prescribes medication for you knows that you are taking Coumadin (warfarin).  Also make sure the healthcare provider who is monitoring your Coumadin knows when you have started a new medication including herbals and non-prescription products.  Coumadin (Warfarin)  Major Drug Interactions  Increased Warfarin Effect Decreased Warfarin Effect   Alcohol (large quantities) Antibiotics (esp. Septra/Bactrim, Flagyl, Cipro) Amiodarone (Cordarone) Aspirin (ASA) Cimetidine (Tagamet) Megestrol (Megace) NSAIDs (ibuprofen, naproxen, etc.) Piroxicam (Feldene) Propafenone (Rythmol SR) Propranolol (Inderal) Isoniazid (INH) Posaconazole (Noxafil) Barbiturates (Phenobarbital) Carbamazepine (Tegretol) Chlordiazepoxide (Librium) Cholestyramine (Questran) Griseofulvin Oral Contraceptives Rifampin Sucralfate (Carafate) Vitamin K   Coumadin (Warfarin) Major Herbal Interactions  Increased Warfarin Effect Decreased Warfarin Effect  Garlic Ginseng Ginkgo biloba Coenzyme Q10 Green tea St. Johns wort    Coumadin (Warfarin) FOOD Interactions  Eat a consistent number of servings per week of foods HIGH in Vitamin K (1 serving =  cup)  Collards (cooked, or boiled & drained) Kale (cooked, or boiled & drained) Mustard greens (cooked, or boiled & drained) Parsley *serving size only =  cup Spinach (cooked, or boiled & drained) Swiss chard (cooked, or boiled &  drained) Turnip greens (cooked, or boiled & drained)  Eat a consistent number of servings per week of foods MEDIUM-HIGH in Vitamin K (1 serving = 1 cup)  Asparagus (cooked, or boiled & drained) Broccoli (cooked, boiled & drained, or raw & chopped) Brussel sprouts (cooked, or boiled & drained) *serving size only =  cup Lettuce, raw (green leaf, endive, romaine) Spinach, raw Turnip greens, raw & chopped   These websites have more information on Coumadin (warfarin):  FailFactory.se; VeganReport.com.au;

## 2014-07-29 NOTE — Clinical Social Work Note (Signed)
CSW informed that patient will need 24 hour supervision due to recent heart surgery.  Patient is agreeable to SNF for short term rehab/supervision- CSW initiated bed search  CSW will continue to follow.  Domenica Reamer, [place] Social Worker [phone]

## 2014-07-29 NOTE — Evaluation (Signed)
Physical Therapy Evaluation Patient Details Name: Donald Escobar MRN: [id number] DOB: [birth year] Today's Date: 07/29/2014   History of Present Illness  Donald Escobar is a 59 yo man with history of asthma, hypertension and non-Hodgkins lymphoma treated in 2003. He has no prior history of cardiac disease. He says that over the past 2- 3 weeks he has been getting short of breath with exertion. His work requires relatively heavy exertion and he has been getting SOB with activities he used to do easily. He also complains of his heart beating irregularly. Pt underwent CABG x2 on 07/25/14  Clinical Impression  Pt admitted with above diagnosis. Pt currently with functional limitations due to the deficits listed below (see PT Problem List). Pt ambulated 300' with min A progressing to min-guard. Pt educated on sternal precautions as well as self-regulation, pt needs more work with this. Recommend SNF due to pt living alone.  Pt will benefit from skilled PT to increase their independence and safety with mobility to allow discharge to the venue listed below.       Follow Up Recommendations SNF    Equipment Recommendations  Rolling walker with 5" wheels    Recommendations for Other Services       Precautions / Restrictions Precautions Precautions: Sternal Precaution Comments: reviewed sternal precautions and why he has them      Mobility  Bed Mobility               General bed mobility comments: pt received in chair, needs to practice bed mobility keeping sternal precautions next session  Transfers Overall transfer level: Needs assistance   Transfers: Sit to/from Stand Sit to Stand: Supervision         General transfer comment: no physical assist required to stand, pt a bit impulsive, vc's for pt to slow down and make sure he was not dizzy and had his bearings before beginning to ambulate  Ambulation/Gait Ambulation/Gait assistance: Min assist;Min guard Ambulation Distance (Feet): 300  Feet Assistive device: None Gait Pattern/deviations: Step-through pattern;Wide base of support Gait velocity: WFL Gait velocity interpretation: at or above normal speed for age/gender General Gait Details: pt ambulates with increased sway right and left but no LOB. Pt is a talker and does not self monitor very well, became SOB but continues to verbalize until PT cued him to walk only until back to sitting. Discussed pt listening to his body more closely right now as he recovers  Financial trader [employer] (Stroke Patients Only)       Balance Overall balance assessment: Needs assistance Sitting-balance support: No upper extremity supported Sitting balance-Leahy Scale: Normal     Standing balance support: No upper extremity supported Standing balance-Leahy Scale: Good Standing balance comment: slightly unsteady with perturbation                             Pertinent Vitals/Pain Pain Assessment: Faces Faces Pain Scale: Hurts little more Pain Location: right upper thigh Pain Intervention(s): Monitored during session  HR 100 bpm with ambulation O2 sats would not read properly on pt's finger but DOE 3/4 with extended ambulation, decreased to 2/4 when not talking    Home Living Family/patient expects to be discharged to:: Skilled nursing facility Living Arrangements: Alone Available Help at Discharge: Other (Comment) (none) Type of Home: House Home Access: Stairs to enter Entrance Stairs-Rails: Left Entrance Stairs-Number  of Steps: 2 Home Layout: One level Home Equipment: None      Prior Function Level of Independence: Independent         Comments: pt has a physical job involving walking, lifting, 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     Hand Dominance        Extremity/Trunk Assessment   Upper Extremity Assessment: Overall WFL for tasks assessed           Lower Extremity Assessment: Overall WFL for  tasks assessed      Cervical / Trunk Assessment: Normal  Communication   Communication: No difficulties  Cognition Arousal/Alertness: Awake/alert Behavior During Therapy: WFL for tasks assessed/performed Overall Cognitive Status: Within Functional Limits for tasks assessed                      General Comments      Exercises        Assessment/Plan    PT Assessment Patient needs continued PT services  PT Diagnosis Acute pain;Abnormality of gait   PT Problem List Decreased activity tolerance;Decreased balance;Decreased mobility;Decreased knowledge of precautions;Pain;Obesity;Cardiopulmonary status limiting activity  PT Treatment Interventions Gait training;Functional mobility training;Therapeutic activities;Therapeutic exercise;Balance training;Patient/family education   PT Goals (Current goals can be found in the Care Plan section) Acute Rehab PT Goals Patient Stated Goal: SNF then home PT Goal Formulation: With patient Time For Goal Achievement: 08/12/14 Potential to Achieve Goals: Good    Frequency Min 2X/week   Barriers to discharge Decreased caregiver support no help at home    Co-evaluation               End of Session Equipment Utilized During Treatment: Gait belt Activity Tolerance: Patient tolerated treatment well;Patient limited by fatigue Patient left: in chair;with call bell/phone within reach Nurse Communication: Mobility status         Time: 2671-2458 PT Time Calculation (min) (ACUTE ONLY): 18 min   Charges:   PT Evaluation $Initial PT Evaluation Tier I: 1 Procedure     PT G Codes:      Leighton Roach, PT  Acute Rehab Services  [place], [place] 07/29/2014, 2:25 PM

## 2014-07-29 NOTE — Progress Notes (Addendum)
      [address]       [place] [address]             [phone]      5 Days Post-Op Procedure(s) (LRB): CORONARY ARTERY BYPASS GRAFTING (CABG), ON PUMP, TIMES TWO, USING LEFT INTERNAL MAMMARY ARTERY, LEFT GREATER SAPHENOUS VEIN HARVESTED ENDOSCOPICALLY (N/A) AORTIC VALVE REPLACEMENT (AVR) (N/A) TRANSESOPHAGEAL ECHOCARDIOGRAM (TEE) (N/A)   Subjective:  [name] has no complaints this morning.  He states he feels pretty good.  + ambulation  No BM  Objective: Vital signs in last 24 hours: Temp:  [97.6 F (36.4 C)-98.9 F (37.2 C)] 97.6 F (36.4 C) (02/15 0413) Pulse Rate:  [74-96] 74 (02/15 0413) Cardiac Rhythm:  [-] Atrial fibrillation (02/14 2109) Resp:  [18] 18 (02/15 0413) BP: (104-111)/(66-94) 104/70 mmHg (02/15 0413) SpO2:  [98 %-99 %] 99 % (02/15 0413) Weight:  [251 lb 14.4 oz (114.261 kg)] 251 lb 14.4 oz (114.261 kg) (02/15 0413)  Intake/Output from previous day: 02/14 0701 - 02/15 0700 In: 932 [P.O.:932] Out: 3175 [Urine:3175]   General appearance: alert, cooperative and no distress Heart: regular rate and rhythm Lungs: clear to auscultation bilaterally Abdomen: soft, non-tender; bowel sounds normal; no masses,  no organomegaly Extremities: edema trace, RLE ecchymotic Wound: clean and dyr  Lab Results:  Recent Labs  07/27/14 0510  WBC 13.7*  HGB 8.7*  HCT 26.4*  PLT 156   BMET:  Recent Labs  07/27/14 0510 07/28/14 0440  NA 135 136  K 4.5 4.2  CL 100 101  CO2 30 28  GLUCOSE 107* 137*  BUN 17 16  CREATININE 1.00 0.98  CALCIUM 8.2* 8.7    PT/INR:  Recent Labs  07/29/14 0549  LABPROT 16.2*  INR 1.29   ABG    Component Value Date/Time   PHART 7.364 07/24/2014 2111   HCO3 21.2 07/24/2014 2111   TCO2 20 07/25/2014 1631   ACIDBASEDEF 4.0* 07/24/2014 2111   O2SAT 96.0 07/24/2014 2111   CBG (last 3)   Recent Labs  07/26/14 2140 07/27/14 0636 07/27/14 1114  GLUCAP 162* 101* 118*    Assessment/Plan: S/P  Procedure(s) (LRB): CORONARY ARTERY BYPASS GRAFTING (CABG), ON PUMP, TIMES TWO, USING LEFT INTERNAL MAMMARY ARTERY, LEFT GREATER SAPHENOUS VEIN HARVESTED ENDOSCOPICALLY (N/A) AORTIC VALVE REPLACEMENT (AVR) (N/A) TRANSESOPHAGEAL ECHOCARDIOGRAM (TEE) (N/A)  1. CV- Previous A. Fib, currently NSR- continue Amiodarone and Coreg 2. INR 1.29 has had 2 doses of 5 mg will repeat tonight if no increase in INR will increase dose to 7.5 3. Renal- creatinine has been WNL, remains hypervolemia will resume home Lasix 4. GI- constipation lactulose given yesterday with no results, will order Miralax daily, try MOM- if no results could try Mag Citrate tomorrow 5. Dispo- patient maintaining NSR, continue coumadin INR is not yet therapeutic, remove EPW, treat constipation   LOS: 5 days    Donald Escobar, ERIN 07/29/2014  Patient seen and examined, agree with above He did have a BM this AM Wound looks good He is ready for SNF when bed available

## 2014-07-30 LAB — BLOOD GAS, ARTERIAL

## 2014-07-30 LAB — PROTIME-INR
INR: 1.34 (ref 0.00–1.49)
Prothrombin Time: 16.7 seconds — ABNORMAL HIGH (ref 11.6–15.2)

## 2014-07-30 MED ORDER — POTASSIUM CHLORIDE CRYS ER 20 MEQ PO TBCR: 20.0000 meq | EXTENDED_RELEASE_TABLET | Freq: Every day | ORAL | Status: DC

## 2014-07-30 MED ORDER — AMIODARONE HCL 400 MG PO TABS: 400.0000 mg | ORAL_TABLET | Freq: Three times a day (TID) | ORAL | Status: DC

## 2014-07-30 MED ORDER — CARVEDILOL 6.25 MG PO TABS: 6.2500 mg | ORAL_TABLET | Freq: Two times a day (BID) | ORAL | Status: DC

## 2014-07-30 MED ORDER — WARFARIN SODIUM 7.5 MG PO TABS
7.5000 mg | ORAL_TABLET | Freq: Every day | ORAL | Status: DC
  Filled 2014-07-30: qty 1

## 2014-07-30 MED ORDER — OXYCODONE HCL 5 MG PO TABS: 5.0000 mg | ORAL_TABLET | ORAL | Status: DC | PRN

## 2014-07-30 MED ORDER — WARFARIN SODIUM 7.5 MG PO TABS: 7.5000 mg | ORAL_TABLET | Freq: Every day | ORAL | Status: DC

## 2014-07-30 NOTE — Progress Notes (Addendum)
Centralized telemetry called to notify of pt being afib rvr, current rate is 132, put up to BR, no complaints noted. Kathleen Argue S 1:07 PM   Pt back to bed HR currently 99, Afib. 1:11 PM

## 2014-07-30 NOTE — Clinical Social Work Placement (Signed)
Clinical Social Work Department CLINICAL SOCIAL WORK PLACEMENT NOTE 07/30/2014  Patient:  BROADY, LAFOY  Account Number:  [id number] Admit date:  07/24/2014  Clinical Social Worker:  Domenica Reamer, CLINICAL SOCIAL WORKER  Date/time:  07/30/2014 01:26 PM  Clinical Social Work is seeking post-discharge placement for this patient at the following level of care:   SKILLED NURSING   (*CSW will update this form in Epic as items are completed)   07/29/2014  Patient/family provided with [place] Department of Clinical Social Work's list of facilities offering this level of care within the geographic area requested by the patient (or if unable, by the patient's family).  07/29/2014  Patient/family informed of their freedom to choose among providers that offer the needed level of care, that participate in Medicare, Medicaid or managed care program needed by the patient, have an available bed and are willing to accept the patient.  07/29/2014  Patient/family informed of [facility]' ownership interest in [facility], as well as of the fact that they are under no obligation to receive care at this facility.  PASARR submitted to EDS on 07/29/2014 PASARR number received on 07/29/2014  FL2 transmitted to all facilities in geographic area requested by pt/family on  07/29/2014 FL2 transmitted to all facilities within larger geographic area on   Patient informed that his/her managed care company has contracts with or will negotiate with  certain facilities, including the following:     Patient/family informed of bed offers received:  07/30/2014 Patient chooses bed at [place] Physician recommends and patient chooses bed at    Patient to be transferred to [place] on  07/30/2014 Patient to be transferred to facility by ptar Patient and family notified of transfer on 07/30/2014 Name of family member notified:  Elta Guadeloupe  The following  physician request were entered in Epic:   Additional Comments: Domenica Reamer, [place] Social Worker [phone]

## 2014-07-30 NOTE — Progress Notes (Signed)
Patient watched education video on Understanding Blood Thinners. Will continue to monitor.  Domingo Dimes RN

## 2014-07-30 NOTE — Evaluation (Signed)
Occupational Therapy Evaluation Patient Details Name: Donald Escobar MRN: [id number] DOB: 02-15-1956 Today's Date: 07/30/2014    History of Present Illness Donald Escobar is a 59 yo man with history of asthma, hypertension and non-Hodgkins lymphoma treated in 2003. He has no prior history of cardiac disease. He says that over the past 2- 3 weeks he has been getting short of breath with exertion. His work requires relatively heavy exertion and he has been getting SOB with activities he used to do easily. He also complains of his heart beating irregularly. Pt underwent CABG x2 on 07/25/14   Clinical Impression   Pt was independent and active prior to admission.  Pt presents with mild balance deficits and impaired activity tolerance.  He requires min assist for LB ADL.  Pt needs further education in sternal precautions.  Pt with plans for d/c to ST rehab in SNF as he lives alone.  Will follow acutely.    Follow Up Recommendations  SNF    Equipment Recommendations       Recommendations for Other Services       Precautions / Restrictions Precautions Precautions: Sternal Precaution Comments: reviewed sternal precautions and why he has them      Mobility Bed Mobility               General bed mobility comments: pt received in chair  Transfers Overall transfer level: Needs assistance Equipment used: None Transfers: Sit to/from Stand Sit to Stand: Supervision         General transfer comment: cues to stand momentarily before walking    Balance                                            ADL Overall ADL's : Needs assistance/impaired Eating/Feeding: Independent;Sitting   Grooming: Supervision/safety;Standing   Upper Body Bathing: Set up;Sitting   Lower Body Bathing: Minimal assistance;Sit to/from stand   Upper Body Dressing : Set up;Sitting   Lower Body Dressing: Minimal assistance;Sit to/from stand   Toilet Transfer:  Supervision/safety;Ambulation;Comfort height toilet   Toileting- Clothing Manipulation and Hygiene: Supervision/safety;Sit to/from stand       Functional mobility during ADLs: Supervision/safety General ADL Comments: Pt typically bends forward to reach feet for ADL, not able to cross foot over opposite knee to donn and doff socks. Pt fatigues with attempt to reach feet.  Could benefit from education in use of AE.     Vision     Perception     Praxis      Pertinent Vitals/Pain Pain Assessment: No/denies pain     Hand Dominance Right   Extremity/Trunk Assessment Upper Extremity Assessment Upper Extremity Assessment: Overall WFL for tasks assessed   Lower Extremity Assessment Lower Extremity Assessment: Overall WFL for tasks assessed   Cervical / Trunk Assessment Cervical / Trunk Assessment: Normal   Communication Communication Communication: No difficulties   Cognition Arousal/Alertness: Awake/alert Behavior During Therapy: WFL for tasks assessed/performed Overall Cognitive Status: Within Functional Limits for tasks assessed                     General Comments       Exercises       Shoulder Instructions      Home Living Family/patient expects to be discharged to:: Skilled nursing facility Living Arrangements: Alone Available Help at Discharge: Other (Comment) (none) Type of Home: [place]  Access: Stairs to enter [employer] of Steps: 2 Entrance Stairs-Rails: Left Home Layout: One level               Home Equipment: None          Prior Functioning/Environment Level of Independence: Independent        Comments: pt has a physical job involving walking, lifting, 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    OT Diagnosis: Generalized weakness   OT Problem List: Decreased activity tolerance;Obesity;Decreased knowledge of precautions;Cardiopulmonary status limiting activity   OT Treatment/Interventions: Self-care/ADL  training;DME and/or AE instruction;Energy conservation;Therapeutic activities;Balance training;Patient/family education    OT Goals(Current goals can be found in the care plan section) Acute Rehab OT Goals Patient Stated Goal: SNF then home OT Goal Formulation: With patient Time For Goal Achievement: 08/06/14 Potential to Achieve Goals: Good ADL Goals Pt Will Perform Grooming: with modified independence;standing Pt Will Perform Lower Body Bathing: with modified independence;with adaptive equipment;sit to/from stand Pt Will Perform Lower Body Dressing: with modified independence;with adaptive equipment;sit to/from stand Pt Will Transfer to Toilet: with modified independence;ambulating;regular height toilet Pt Will Perform Toileting - Clothing Manipulation and hygiene: with modified independence;sit to/from stand Additional ADL Goal #1: Pt will generalize sternal precautions on ADL. Additional ADL Goal #2: Pt will state at least 3 energy conservation strategies.  OT Frequency: Min 2X/week   Barriers to D/C:            Co-evaluation              End of Session    Activity Tolerance: Patient limited by fatigue Patient left: in chair;with call bell/phone within reach   Time: 1200-1225 OT Time Calculation (min): 25 min Charges:  OT General Charges $OT Visit: 1 Procedure OT Evaluation $Initial OT Evaluation Tier I: 1 Procedure OT Treatments $Self Care/Home Management : 8-22 mins G-Codes:    Malka So 07/30/2014, 1:12 PM  [phone]

## 2014-07-30 NOTE — Progress Notes (Signed)
PA paged about HR. No new orders received. Pt to remain for D/C as this is not new and pt is on medication regimen per PA. Will continue to monitor pt closely.

## 2014-07-30 NOTE — Clinical Social Work Psychosocial (Signed)
Clinical Social Work Department BRIEF PSYCHOSOCIAL ASSESSMENT 07/30/2014  Patient:  Donald Escobar, Donald Escobar     Account Number:  [id number]     Admit date:  07/24/2014  Clinical Social Worker:  Domenica Reamer, [place]  Date/Time:  07/29/2014 01:22 PM  Referred by:  Physician  Date Referred:  07/29/2014 Referred for  SNF Placement   Other Referral:   Interview type:  Patient Other interview type:    PSYCHOSOCIAL DATA Living Status:  ALONE Admitted from facility:   Level of care:   Primary support name:  Larita Fife Primary support relationship to patient:  FAMILY Degree of support available:   low level of support- patient has one cousin who lives locally but who is already a caregiver for their elderly mother- patient lives alone and has no one he feels care bring him food or help with home needs while he recovers    CURRENT CONCERNS Current Concerns  Post-Acute Placement   Other Concerns:    SOCIAL WORK ASSESSMENT / PLAN CSW spoke with patient concerning SNF recommendation- patient is agreeable to SNF placement for 1-2 weeks as he recovers from his heart surgery- patient statest that he knows he is not stable to live alone at this time and does not feel as if he is able to move around well enough to be safe at home.   Assessment/plan status:  Psychosocial Support/Ongoing Assessment of Needs Other assessment/ plan:   FL2  PASAR   Information/referral to community resources:    PATIENT'S/FAMILY'S RESPONSE TO PLAN OF CARE: Patient is agreeable to plan for SNF placement and states that this heart procedure has made him realize that he needs to be in contact with family more- patient is greatful that his surgery went well and that he now talks more with his cousin as a result of the procedure.    Domenica Reamer, [place] Social Worker [phone]

## 2014-07-30 NOTE — Clinical Social Work Note (Signed)
Patient is agreeable to LOG SNF placement- bed available at [facility].  CSW will continue to follow.  Domenica Reamer, [place] Social Worker [phone]

## 2014-07-30 NOTE — Clinical Social Work Note (Signed)
Patient will discharge to [facility]  Anticipated discharge date:07/30/14 Family notified: patient to notify Transportation by PTAR- scheduled for 2pm  CSW signing off.  Domenica Reamer, [place] Social Worker [phone]

## 2014-07-30 NOTE — Progress Notes (Signed)
CARDIAC REHAB PHASE I   PRE:  Rate/Rhythm: 82 SR  BP:  Supine:   Sitting: 131/78  Standing:    SaO2: 98 RA  MODE:  Ambulation: 1040 ft   POST:  Rate/Rhythm: 101 ST  BP:  Supine:   Sitting: 154/91  Standing:    SaO2: 98 RA 0930-0955 Pt tolerated ambulation well without c/o. Gait steady without walker. VS stable Pt able to walk 1040 feet. Pt back to recliner with call light in reach.  Rodney Langton RN 07/30/2014 9:51 AM

## 2014-07-30 NOTE — Progress Notes (Signed)
1320-1400 Cardiac Rehab Completed surgery discharge and  CHF education with pt. He has CHF packet. We discussed daily weights, sodium and fluid restrictions, when to call MD and 911.He voices understanding. Pt agrees to [place]. CRP in [place], will send referral. I placed surgery discharge video for him to watch. Deon Pilling, RN 07/30/2014 2:09 PM

## 2014-07-30 NOTE — Progress Notes (Signed)
Assessment unchanged. Discussed D/C instructions with pt including f/u appointments, incision care, and new medications. Verbalized understanding. RX given to pt. IV and tele removed. Pt left with belongings accompanied by EMS.

## 2014-07-30 NOTE — Progress Notes (Addendum)
      [address]       ,[place] [address]             [phone]      6 Days Post-Op Procedure(s) (LRB): CORONARY ARTERY BYPASS GRAFTING (CABG), ON PUMP, TIMES TWO, USING LEFT INTERNAL MAMMARY ARTERY, LEFT GREATER SAPHENOUS VEIN HARVESTED ENDOSCOPICALLY (N/A) AORTIC VALVE REPLACEMENT (AVR) (N/A) TRANSESOPHAGEAL ECHOCARDIOGRAM (TEE) (N/A)   Subjective:  Mr. Bender complains he had a rough evening.  He states that he a coughing spell last night.  He also stated he sneezed pretty hard and he felt a pop.  +BM  Objective: Vital signs in last 24 hours: Temp:  [97.8 F (36.6 C)-98.4 F (36.9 C)] 97.8 F (36.6 C) (02/16 0531) Pulse Rate:  [74-81] 74 (02/16 0531) Cardiac Rhythm:  [-] Atrial fibrillation (02/15 2033) Resp:  [18-19] 18 (02/16 0531) BP: (119-163)/(72-93) 119/76 mmHg (02/16 0531) SpO2:  [99 %-100 %] 100 % (02/16 0531) Weight:  [251 lb 11.2 oz (114.17 kg)] 251 lb 11.2 oz (114.17 kg) (02/16 0531)  Intake/Output from previous day: 02/15 0701 - 02/16 0700 In: 840 [P.O.:840] Out: 2501 [Urine:2500; Stool:1]  General appearance: alert, cooperative and no distress Heart: regular rate and rhythm, sternum stable Lungs: clear to auscultation bilaterally Abdomen: soft, non-tender; bowel sounds normal; no masses,  no organomegaly Extremities: edema trace, RLE ecchymotic Wound: clean and dry  Lab Results: No results for input(s): WBC, HGB, HCT, PLT in the last 72 hours. BMET:  Recent Labs  07/28/14 0440  NA 136  K 4.2  CL 101  CO2 28  GLUCOSE 137*  BUN 16  CREATININE 0.98  CALCIUM 8.7    PT/INR:  Recent Labs  07/30/14 0448  LABPROT 16.7*  INR 1.34   ABG    Component Value Date/Time   PHART 7.364 07/24/2014 2111   HCO3 21.2 07/24/2014 2111   TCO2 20 07/25/2014 1631   ACIDBASEDEF 4.0* 07/24/2014 2111   O2SAT 96.0 07/24/2014 2111   CBG (last 3)   Recent Labs  07/27/14 1114  GLUCAP 118*    Assessment/Plan: S/P Procedure(s)  (LRB): CORONARY ARTERY BYPASS GRAFTING (CABG), ON PUMP, TIMES TWO, USING LEFT INTERNAL MAMMARY ARTERY, LEFT GREATER SAPHENOUS VEIN HARVESTED ENDOSCOPICALLY (N/A) AORTIC VALVE REPLACEMENT (AVR) (N/A) TRANSESOPHAGEAL ECHOCARDIOGRAM (TEE) (N/A)  1. CV- Previous A. Fib, currently NSR- continue Amiodarone and Coreg 2. INR 1.34 minimal response to coumadin, will increase dose to 7.5 mg 3. Renal- remains hypervolemic, continue Lasix at home dose 4. Deconditioning- patient lives alone, we are working on SNF placement, however patient states he was told last night he will most likely not be a candidate because he is doing too well.  Patient is concerned because he states his home is very messy and it would not be safe for him to go home at this time 5. Dispo- patient stable, no dehiscence of sternal wires on exam, INR slowly increasing will increase dose of Coumadin, to SNF once bed available   LOS: 6 days    BARRETT, ERIN 07/30/2014  Patient seen and examined, agree with above He is ready for discharge once some kind of arrangement can be made  Remo Lipps C. Roxan Hockey, MD [facility] [phone]

## 2014-07-31 NOTE — Care Management Note (Signed)
    Page 1 of 1   07/31/2014     12:19:02 PM CARE MANAGEMENT NOTE 07/31/2014  Patient:  Donald Escobar, Donald Escobar   Account Number:  [id number]  Date Initiated:  07/25/2014  Documentation initiated by:  [facility]  Subjective/Objective Assessment:   Admitted post CABG x2 and AVR.     Action/Plan:   Anticipated DC Date:  07/30/2014   Anticipated DC Plan:  SKILLED NURSING FACILITY  In-house referral  Clinical Social Worker      DC Planning Services  CM consult      Choice offered to / List presented to:             Status of service:  Completed, signed off Medicare Important Message given?  NO (If response is "NO", the following Medicare IM given date fields will be blank) Date Medicare IM given:   Medicare IM given by:   Date Additional Medicare IM given:   Additional Medicare IM given by:    Discharge Disposition:  [place]  Per UR Regulation:  Reviewed for med. necessity/level of care/duration of stay  If discussed at [place] of Stay Meetings, dates discussed:   07/30/2014    Comments:  Contact: Donald Escobar Relative [phone]  [phone]   Campbell,Mark Relative [phone]  07/30/14- 1200- Donald Gibbons RN, BSN [phone] Plan to d/c to SNF today- CSW following for placement needs  07/26/14 [place] Pt states he will probably need short-term rehab before returning home as he lives alone and will have no one to provide 24/7 assistance when discharged.  Pt also has FMLA form to be completed and requests CM fax to [place] office. TC to office, form faxed to [phone] as directed.

## 2014-08-15 ENCOUNTER — Other Ambulatory Visit: Payer: Self-pay | Admitting: Thoracic Surgery (Cardiothoracic Vascular Surgery)

## 2014-08-15 DIAGNOSIS — Z951 Presence of aortocoronary bypass graft: Secondary | ICD-10-CM

## 2014-08-20 ENCOUNTER — Ambulatory Visit
Admission: RE | Admit: 2014-08-20 | Discharge: 2014-08-20 | Disposition: A | Discharge status: Home / Self Care | Payer: BLUE CROSS/BLUE SHIELD | Source: Ambulatory Visit | Attending: Thoracic Surgery (Cardiothoracic Vascular Surgery) | Admitting: Thoracic Surgery (Cardiothoracic Vascular Surgery)

## 2014-08-20 ENCOUNTER — Other Ambulatory Visit: Payer: Self-pay

## 2014-08-20 ENCOUNTER — Ambulatory Visit (INDEPENDENT_AMBULATORY_CARE_PROVIDER_SITE_OTHER): Payer: Self-pay | Admitting: Thoracic Surgery (Cardiothoracic Vascular Surgery)

## 2014-08-20 ENCOUNTER — Encounter: Payer: Self-pay | Admitting: Thoracic Surgery (Cardiothoracic Vascular Surgery)

## 2014-08-20 VITALS — BP 140/95 | HR 74 | Resp 20 | Ht 65.0 in | Wt 240.0 lb

## 2014-08-20 DIAGNOSIS — Z952 Presence of prosthetic heart valve: Secondary | ICD-10-CM

## 2014-08-20 DIAGNOSIS — Z954 Presence of other heart-valve replacement: Secondary | ICD-10-CM

## 2014-08-20 DIAGNOSIS — Z951 Presence of aortocoronary bypass graft: Secondary | ICD-10-CM

## 2014-08-20 LAB — PROTIME-INR
INR: 2.44 — AB (ref <1.50)
Prothrombin Time: 26.7 seconds — ABNORMAL HIGH (ref 11.6–15.2)

## 2014-08-20 MED ORDER — AMIODARONE HCL 400 MG PO TABS: 400.0000 mg | ORAL_TABLET | Freq: Every day | ORAL | Status: DC

## 2014-08-20 NOTE — Progress Notes (Signed)
[address]       [place] [address]             [phone]       Donald Escobar returns today for a scheduled postoperative follow-up visit.  He is a 59 year old man who had coronary bypass grafting 2 and aortic valve replacement with a Peri-mount bovine pericardial valve on 07/24/2014. He had atrial fibrillation postoperatively and was discharged home on amiodarone and Coumadin. Other than that his postoperative course was uncomplicated.  He says that he feels tired and rundown. He hasn't had any anginal-type chest discomfort. He does have some incisional pain. He is only occasionally using oxycodone and then only at night. He has not seen a cardiologist since discharge. He had a lot of bruising and swelling in his right leg. He says that is going down now.  Past Medical History  Diagnosis Date  . Asthma   . Non Hodgkin's lymphoma 2003    a.  tx with anthracycline.  . Hypertension   . Chronic systolic CHF (congestive heart failure)     a. 2D Echo 07/13/14 - EF 25-30%, severe diffuse HK with WMA< grade 2 DD, mild AS, mod-severe AI. b. TEE 07/15/14 - EF 40-45%, severe AI.  [name] Aortic insufficiency   . CAD (coronary artery disease)     a. Small NSTEMI 06/2014: cath 07/15/14: 90% prox LAD diagonal branch bifurcation disease.  . Palpitations     a. 06/2014: telemetry significant for brief irregular wide complex tachycardia (question AF/NSVT), and narrow complex irregular tachycardia with P wave activity (question PAT). Quiescent after BB initiation.  . Abnormal CT scan     a. 06/2014: Incidental findings on CTA which will need follow-up per primary care - 50mm R middle lobe lung nodule noted on CTA, heterogeneous adrenal adenoma noted at L adrenal gland, 58mm nonobstructing stone in upper pole of L kidney, scattered hepatic cysts.  . Hyperlipemia   . Morbid obesity   . Shortness of breath dyspnea     With exertion.  . Pneumonia 2003  . Personal history of kidney stones    current - no pain    Current Outpatient Prescriptions  Medication Sig Dispense Refill  . albuterol (PROVENTIL HFA;VENTOLIN HFA) 108 (90 BASE) MCG/ACT inhaler Inhale 2 puffs into the lungs every 6 (six) hours as needed for wheezing or shortness of breath.     [name] amiodarone (PACERONE) 400 MG tablet Take 1 tablet (400 mg total) by mouth daily.    [name] aspirin EC 81 MG EC tablet Take 1 tablet (81 mg total) by mouth daily. 30 tablet 6  . atorvastatin (LIPITOR) 40 MG tablet Take 1 tablet (40 mg total) by mouth every evening. 30 tablet 6  . carvedilol (COREG) 6.25 MG tablet Take 1 tablet (6.25 mg total) by mouth 2 (two) times daily with a meal.    . cholecalciferol (VITAMIN D) 1000 UNITS tablet Take 1,000 Units by mouth daily.    . furosemide (LASIX) 40 MG tablet Take 1 tablet (40 mg total) by mouth daily. 30 tablet 3  . LORazepam (ATIVAN) 0.5 MG tablet Take 0.5 mg by mouth at bedtime.    [name] oxyCODONE (OXY IR/ROXICODONE) 5 MG immediate release tablet Take 1-2 tablets (5-10 mg total) by mouth every 3 (three) hours as needed for severe pain. 30 tablet 0  . potassium chloride SA (K-DUR,KLOR-CON) 20 MEQ tablet Take 1 tablet (20 mEq total) by mouth daily.    [name]  warfarin (COUMADIN) 7.5 MG tablet Take 1 tablet (7.5 mg total) by mouth daily at 6 PM.     No current facility-administered medications for this visit.    Physical Exam BP 140/95 mmHg  Pulse 74  Resp 20  Ht 5\' 5"  (1.651 m)  Wt 240 lb (108.863 kg)  BMI 39.94 kg/m2  SpO8 53% 59 year old man in no acute distress Flat affect Alert and oriented 3 with no neurologic deficits Cardiac regular rate and rhythm normal S1 and S2, no murmur Lungs clear with equal breath sounds bilaterally Ecchymosis right leg, trace edema right leg Sternum stable, incision healing well  Diagnostic Tests: Chest x-ray shows cardiomegaly and postoperative changes. No significant effusion or infiltrate.  Impression: Mr. Frampton is a 59 year old gentleman who is now about  a month out from aVR and coronary bypass grafting 2. Overall he is doing well. I discussed his rate of progress with him. It really is about average. It is not unusual to still be having some discomfort. He should start to notice some improvement in his energy level over the next 2-3 weeks  He did have atrial fibrillation postoperatively and was discharged on amiodarone and Coumadin. He is still on amiodarone 400 mg 3 times daily. He is going to decrease that to once daily.  His INR has not been checked since discharge. We are going to check that today and adjust his Coumadin dose accordingly.  He has not seen a cardiologist since discharge. My office will call and make an appointment for him.  Since he is no longer taking pain medication during the day, I think he is safe to begin driving. Appropriate precautions were discussed. He is not to lift anything over 10 pounds for another 2 weeks.  Plan: Decrease amiodarone to 400 mg daily  PT/INR today  Arrange appointment with cardiology for follow-up  I will see him back in 3 weeks to check on his progress.  Melrose Nakayama, MD [facility] [phone]

## 2014-08-26 ENCOUNTER — Ambulatory Visit: Payer: BLUE CROSS/BLUE SHIELD | Admitting: Pharmacist Clinician (PhC)/ Clinical Pharmacy Specialist

## 2014-09-09 ENCOUNTER — Ambulatory Visit (INDEPENDENT_AMBULATORY_CARE_PROVIDER_SITE_OTHER): Payer: BLUE CROSS/BLUE SHIELD | Admitting: Cardiology

## 2014-09-09 ENCOUNTER — Encounter: Payer: Self-pay | Admitting: Cardiology

## 2014-09-09 VITALS — BP 132/80 | HR 91 | Ht 65.0 in | Wt 247.7 lb

## 2014-09-09 DIAGNOSIS — I480 Paroxysmal atrial fibrillation: Secondary | ICD-10-CM

## 2014-09-09 DIAGNOSIS — I25810 Atherosclerosis of coronary artery bypass graft(s) without angina pectoris: Secondary | ICD-10-CM | POA: Diagnosis not present

## 2014-09-09 DIAGNOSIS — Z951 Presence of aortocoronary bypass graft: Secondary | ICD-10-CM

## 2014-09-09 DIAGNOSIS — I214 Non-ST elevation (NSTEMI) myocardial infarction: Secondary | ICD-10-CM

## 2014-09-09 MED ORDER — WARFARIN SODIUM 5 MG PO TABS: ORAL_TABLET | ORAL | Status: DC

## 2014-09-09 MED ORDER — AMIODARONE HCL 200 MG PO TABS: 200.0000 mg | ORAL_TABLET | Freq: Every day | ORAL | Status: DC

## 2014-09-09 NOTE — Progress Notes (Signed)
Cardiology Office Note   Date:  09/09/2014   ID:  Donald Escobar, DOB September 23, 1955, MRN [id number]  PCP:  No PCP Per Patient  Cardiologist:  Dr. Adora Fridge     Chief Complaint  Patient presents with  . Coronary Artery Disease    CABG 07/24/14 dizziness on occasion when lying down. chest discomfort on occasion, this could still be from healing.       History of Present Illness: Donald Escobar is a 59 y.o. male who presents for follow-up after coronary artery bypass grafting and aortic valve replacement with a tissue valve. EF is 40-45%.  He has a history of asthma (no PFTs per pt), non-hodgkins lymphoma and HTN who presented to [facility] with palpitations. He saw the NP at work who recommended he get further evaluation based on an abnormal ECG. He reported he had felt his heart beating irregularly, can be fast, occuring daily. He had NSTEMI, lung nodule, and adenoma of Lt adrenal gland. He was diuresed, and due to CTA with heavy calcification Lt main and prox LAD.  Also with mod. To severe AI, depressed EF.  TEE with Aortic Valve: calcified. The right and left cusps are partially fused and the noncoronary cups is immobile. Functionally a bicuspid valve. No vegetation.  Cardiac cath with high-grade proximal LAD diagonal branch bifurcation disease, moderate-to-severe aortic insufficiency with moderate to severe LV dysfunction and a generous thoracic aorta. He was seen by Dr. Roxan Hockey and surgery was planned.  He did go home between cath and surgery to take care of issues before surgery.  07/24/14 he had : CORONARY ARTERY BYPASS GRAFTING (CABG) x 2, ON PUMP LEFT INTERNAL MAMMARY ARTERY to LAD LEFT GREATER SAPHENOUS VEIN to DIAGONAL 1 ENDOSCOPIC VEIN HARVEST LEFT THIGH  AORTIC VALVE REPLACEMENT (AVR)# 25 PERIMOUNT BOVINE PERICARDIAL VALVE (BIOPROSTHETIC)  Post op he did well, with diuresis and was placed on amiodarone for a fib.  He was discharged on coumadin for PAF.   Initially to skilled nursing facility for rehabilitation. He is now at home he is driving. He has been followed up with Dr. Roxan Hockey. His INR was therapeutic at 2.4 on March 8. Unfortunately patient ran out of Coumadin and amiodarone Thursday or Friday of this past week INR today is 1.0. TEE with surgery refilled EF of 40-45%.  Today he has some mild incisional pain but noted cardiac pain no awareness of any rapid heartbeats though he does have frequent PACs. He did have ecchymosis of his right thigh after his cath but that's resolving as well.  He is exercising most days we discussed cardiac rehabilitation which I am recommending. He is eating a healthy diet.   Past Medical History  Diagnosis Date  . Asthma   . Non Hodgkin's lymphoma 2003    a.  tx with anthracycline.  . Hypertension   . Chronic systolic CHF (congestive heart failure)     a. 2D Echo 07/13/14 - EF 25-30%, severe diffuse HK with WMA< grade 2 DD, mild AS, mod-severe AI. b. TEE 07/15/14 - EF 40-45%, severe AI.  [name] Aortic insufficiency   . CAD (coronary artery disease)     a. Small NSTEMI 06/2014: cath 07/15/14: 90% prox LAD diagonal branch bifurcation disease.  . Palpitations     a. 06/2014: telemetry significant for brief irregular wide complex tachycardia (question AF/NSVT), and narrow complex irregular tachycardia with P wave activity (question PAT). Quiescent after BB initiation.  . Abnormal CT scan     a. 06/2014:  Incidental findings on CTA which will need follow-up per primary care - 43mm R middle lobe lung nodule noted on CTA, heterogeneous adrenal adenoma noted at L adrenal gland, 17mm nonobstructing stone in upper pole of L kidney, scattered hepatic cysts.  . Hyperlipemia   . Morbid obesity   . Shortness of breath dyspnea     With exertion.  . Pneumonia 2003  . Personal history of kidney stones     current - no pain    Past Surgical History  Procedure Laterality Date  . Left and right heart catheterization with coronary  angiogram N/A 07/15/2014    Procedure: LEFT AND RIGHT HEART CATHETERIZATION WITH CORONARY ANGIOGRAM;  Surgeon: [name], MD;  Location: [facility] CATH LAB;  Service: Cardiovascular;  Laterality: N/A;  . Tee without cardioversion N/A 07/15/2014    Procedure: TRANSESOPHAGEAL ECHOCARDIOGRAM (TEE);  Surgeon: Thayer Headings, MD;  Location: [place];  Service: Cardiovascular;  Laterality: N/A;  . Coronary artery bypass graft N/A 07/24/2014    Procedure: CORONARY ARTERY BYPASS GRAFTING (CABG), ON PUMP, TIMES TWO, USING LEFT INTERNAL MAMMARY ARTERY, LEFT GREATER SAPHENOUS VEIN HARVESTED ENDOSCOPICALLY;  Surgeon: Melrose Nakayama, MD;  Location: [place];  Service: Open Heart Surgery;  Laterality: N/A;  . Aortic valve replacement N/A 07/24/2014    Procedure: AORTIC VALVE REPLACEMENT (AVR);  Surgeon: Melrose Nakayama, MD;  Location: [place];  Service: Open Heart Surgery;  Laterality: N/A;  . Tee without cardioversion N/A 07/24/2014    Procedure: TRANSESOPHAGEAL ECHOCARDIOGRAM (TEE);  Surgeon: Melrose Nakayama, MD;  Location: [place];  Service: Open Heart Surgery;  Laterality: N/A;     Current Outpatient Prescriptions  Medication Sig Dispense Refill  . albuterol (PROVENTIL HFA;VENTOLIN HFA) 108 (90 BASE) MCG/ACT inhaler Inhale 2 puffs into the lungs every 6 (six) hours as needed for wheezing or shortness of breath.     [name] aspirin EC 81 MG EC tablet Take 1 tablet (81 mg total) by mouth daily. 30 tablet 6  . atorvastatin (LIPITOR) 40 MG tablet Take 1 tablet (40 mg total) by mouth every evening. 30 tablet 6  . carvedilol (COREG) 6.25 MG tablet Take 1 tablet (6.25 mg total) by mouth 2 (two) times daily with a meal.    . cholecalciferol (VITAMIN D) 1000 UNITS tablet Take 1,000 Units by mouth daily.    . furosemide (LASIX) 40 MG tablet Take 1 tablet (40 mg total) by mouth daily. 30 tablet 3  . potassium chloride SA (K-DUR,KLOR-CON) 20 MEQ tablet Take 1 tablet (20 mEq total) by mouth daily.    [name] amiodarone  (PACERONE) 200 MG tablet Take 1 tablet (200 mg total) by mouth daily. 30 tablet 6  . warfarin (COUMADIN) 5 MG tablet Take 1 and 1/2 tablet daily except 2 tablet on Wednesday,or as directed 60 tablet 3   No current facility-administered medications for this visit.    Allergies:   Sulfa antibiotics    Social History:  The patient  reports that he has never smoked. He has never used smokeless tobacco. He reports that he does not drink alcohol or use illicit drugs.   Family History:  The patient's family history includes Hypertension in his mother.    ROS:  General:no colds or fevers, no weight changes Skin:no rashes or ulcers HEENT:no blurred vision, no congestion CV:see HPI PUL:see HPI GI:no diarrhea constipation or melena, no indigestion GU:no hematuria, no dysuria MS:no joint pain, no claudication Neuro:no syncope, no lightheadedness Endo:no diabetes though glucose elevated post op,  no thyroid disease  Wt Readings from Last 3 Encounters:  09/09/14 247 lb 11.2 oz (112.356 kg)  08/20/14 240 lb (108.863 kg)  07/30/14 251 lb 11.2 oz (114.17 kg)     PHYSICAL EXAM: VS:  BP 132/80 mmHg  Pulse 91  Ht 5\' 5"  (1.651 m)  Wt 247 lb 11.2 oz (112.356 kg)  BMI 41.22 kg/m2 , BMI Body mass index is 41.22 kg/(m^2). General:Pleasant affect, NAD Skin:Warm and dry, brisk capillary refill HEENT:normocephalic, sclera clear, mucus membranes moist Neck:supple, no JVD, no bruits  Heart:S1S2 RRR with soft episodic 1/6 murmur, freq PACs, no gallup, rub or click Lungs:clear without rales, rhonchi, or wheezes JHE:RDEYC, soft, non tender, + BS, do not palpate liver spleen or masses Ext:no lower ext edema, 2+ pedal pulses, 2+ radial pulses Neuro:alert and oriented X 3, MAE, follows commands, + facial symmetry    EKG:  EKG is ordered today. The ekg ordered today demonstrates SR with LVH, PACs.   Recent Labs: 07/11/2014: B Natriuretic Peptide 519.1* 07/12/2014: TSH 3.376 07/22/2014: ALT  20 07/25/2014: Magnesium 2.0 07/27/2014: Hemoglobin 8.7*; Platelets 156 07/28/2014: BUN 16; Creatinine 0.98; Potassium 4.2; Sodium 136    Lipid Panel    Component Value Date/Time   CHOL 161 07/12/2014 0107   TRIG 89 07/12/2014 0107   HDL 33* 07/12/2014 0107   CHOLHDL 4.9 07/12/2014 0107   VLDL 18 07/12/2014 0107   LDLCALC 110* 07/12/2014 0107       Other studies Reviewed: Additional studies/ records that were reviewed today include: Hospitalizations EKGs echoes.   ASSESSMENT AND PLAN: CAD with NSTEMI 06/2014 - peak troponin 0.16 - cath 07/15/14: 90% prox LAD diagonal branch bifurcation disease   CABG -07/24/14  CORONARY ARTERY BYPASS GRAFTING (CABG) x 2, ON PUMP LEFT INTERNAL MAMMARY ARTERY to LAD LEFT GREATER SAPHENOUS VEIN to DIAGONAL 1 ENDOSCOPIC VEIN HARVEST LEFT THIGH-- he will follow up with Dr. Adora Fridge in 6 weeks and also I recommended cardiac rehab.  Severe AI- s/p AVR # 25 PERIMOUNT BOVINE PERICARDIAL VALVE (BIOPROSTHETIC) 07/24/14  stable with no shortness of breath no edema    Acute systolic CHF - resolved  2D Echo 07/13/14 - EF 25-30%, severe diffuse HK with WMA< grade 2 DD, mild AS, mod-severe AI - TEE 07/24/14 - EF  40-45%, severe AI  S/p AVR see above  Palpitations  - prior to CABG/ AVR telemetry significant for 2 episodes of arrhythmia: one brief irregular wide complex tachycardia (question AF/NSVT - but slow for VT), and one brief narrow complex irregular tachycardia with P wave activity (question PAT) PAF-  Post op, now on amiodarone has been out for several days will resume at 200 mg daily, has been loaded.    Anticoagulation: resuming coumadin.  7.5 g daily with 10 mg on Wed. And follow up on Monday in coumadin clinic.  Discussed with Dr. Adora Fridge.    HTN stable   Incidental findings on CTA which will need follow-up per primary care - 12mm R middle lobe lung nodule noted on CTA, heterogeneous adrenal adenoma noted at L adrenal gland,  53mm nonobstructing stone in upper pole of L kidney, scattered hepatic cysts  Hyperlipidemia - LDL 110-on lipitor  Morbid obesity Body mass index is 40.44 kg/(m^2).   Asthma stable  H/o Non-Hodgkin's lymphoma tx with anthracycline     Current medicines are reviewed with the patient today.  The patient Has no concerns regarding medicines. We have reordered.  The following changes have been made:  See above  Labs/ tests ordered today include:see above  Disposition:   FU:  see above  Donald Muckle, NP  09/09/2014 6:19 PM    [facility] [place] [place] [place], [place] Phone: [phone]; Fax: [phone]

## 2014-09-09 NOTE — Patient Instructions (Addendum)
You have been referred to [place].  DECREASE AMIODARONE 200 MG ONE TABLET DAILY.  YOU HAVE A 5 MG TABLET ,NOW------WARFARIN (COUMADIN) 7.5 MG ( 1  AND 1/2 TABLETS) DAILY EXCEPT ON WEDNESDAY TAKE 2 TABLETS THAT DAY. [place] NEXT Monday   Your physician wants you to follow-up in 6 WEEKS with Dr Gwenlyn Found.

## 2014-09-10 ENCOUNTER — Other Ambulatory Visit: Payer: Self-pay

## 2014-09-10 ENCOUNTER — Ambulatory Visit: Payer: Self-pay | Admitting: Thoracic Surgery (Cardiothoracic Vascular Surgery)

## 2014-09-10 NOTE — Telephone Encounter (Signed)
The [employer] requested were already filled by Sandria Senter on 09/09/14. Please contact your pharmacy with additional questions.

## 2014-09-16 ENCOUNTER — Ambulatory Visit
Admission: RE | Admit: 2014-09-16 | Discharge: 2014-09-16 | Disposition: A | Discharge status: Home / Self Care | Payer: BLUE CROSS/BLUE SHIELD | Source: Ambulatory Visit | Attending: Thoracic Surgery (Cardiothoracic Vascular Surgery) | Admitting: Thoracic Surgery (Cardiothoracic Vascular Surgery)

## 2014-09-16 ENCOUNTER — Ambulatory Visit (INDEPENDENT_AMBULATORY_CARE_PROVIDER_SITE_OTHER): Payer: BLUE CROSS/BLUE SHIELD | Admitting: Pharmacist Clinician (PhC)/ Clinical Pharmacy Specialist

## 2014-09-16 ENCOUNTER — Other Ambulatory Visit: Payer: Self-pay | Admitting: Thoracic Surgery (Cardiothoracic Vascular Surgery)

## 2014-09-16 ENCOUNTER — Ambulatory Visit (INDEPENDENT_AMBULATORY_CARE_PROVIDER_SITE_OTHER): Payer: Self-pay | Admitting: Surgical

## 2014-09-16 VITALS — BP 126/90 | HR 100 | Resp 20 | Ht 65.0 in | Wt 247.0 lb

## 2014-09-16 DIAGNOSIS — Z954 Presence of other heart-valve replacement: Secondary | ICD-10-CM | POA: Diagnosis not present

## 2014-09-16 DIAGNOSIS — I4891 Unspecified atrial fibrillation: Secondary | ICD-10-CM | POA: Diagnosis not present

## 2014-09-16 DIAGNOSIS — Z7901 Long term (current) use of anticoagulants: Secondary | ICD-10-CM

## 2014-09-16 DIAGNOSIS — Z952 Presence of prosthetic heart valve: Secondary | ICD-10-CM

## 2014-09-16 DIAGNOSIS — Z951 Presence of aortocoronary bypass graft: Secondary | ICD-10-CM

## 2014-09-16 LAB — POCT INR: INR: 1.5

## 2014-09-16 NOTE — Progress Notes (Signed)
[address]       [place] [address]             [phone]                  Donald Escobar [facility] Medical Record #[id number] Date of Birth: 1955/11/16  Referring NT:IRWER, Pearletha Forge, MD Primary Cardiology: Primary Care:No PCP Per Patient  Chief Complaint:  Follow Up Visit   OPERATIVE REPORT   PREOPERATIVE DIAGNOSES: Severe single-vessel coronary disease and moderate-to-severe aortic insufficiency.  POSTOPERATIVE DIAGNOSES: Severe single-vessel coronary disease and moderate-to-severe aortic insufficiency.  PROCEDURE: Median sternotomy, extracorporeal circulation Coronary artery bypass grafting x2  Left internal mammary artery to left anterior descending Saphenous vein graft to first diagonal Endoscopic vein harvest left thigh Aortic valve replacement with 25 mm Edwards Bovine pericardial tissue valve, (Model 2700, serial [id number]).  SURGEON: Revonda Standard. Roxan Hockey, M.D.  ASSISTANT: Cove Giovanni, PA-C.  ANESTHESIA: General.   History of Present Illness:    Patient is a 59 year old male status post above procedure. He is seen today in the office for routine postoperative follow-up. He is doing well. He has resumed driving. He is tolerating routine activities without significant difficulties. He denies shortness of breath. He is not taking any pain medication at this time. He denies fevers, chills or other constitutional symptoms.     Zubrod Score: At the time of surgery this patient's most appropriate activity status/level should be described as: [x]     0    Normal activity, no symptoms []     1    Restricted in physical strenuous activity but ambulatory, able to do out light work []     2    Ambulatory and capable of self care, unable to do work activities, up and about                 >50 % of waking hours                                                                                   []     3     Only limited self care, in bed greater than 50% of waking hours []     4    Completely disabled, no self care, confined to bed or chair []     5    Moribund  History  Smoking status  . Never Smoker   Smokeless tobacco  . Never Used       Allergies  Allergen Reactions  . Sulfa Antibiotics Swelling    Lip swelling    Current Outpatient Prescriptions  Medication Sig Dispense Refill  . albuterol (PROVENTIL HFA;VENTOLIN HFA) 108 (90 BASE) MCG/ACT inhaler Inhale 2 puffs into the lungs every 6 (six) hours as needed for wheezing or shortness of breath.     [name] amiodarone (PACERONE) 200 MG tablet Take 1 tablet (200 mg total) by mouth daily. 30 tablet 6  . aspirin EC 81 MG EC tablet Take 1 tablet (81 mg total) by mouth daily. 30 tablet 6  . atorvastatin (LIPITOR) 40 MG tablet Take 1 tablet (40 mg total) by mouth every evening. 30 tablet  6  . carvedilol (COREG) 3.125 MG tablet Take 3.125 mg by mouth daily.  5  . cholecalciferol (VITAMIN D) 1000 UNITS tablet Take 1,000 Units by mouth daily.    . furosemide (LASIX) 40 MG tablet Take 1 tablet (40 mg total) by mouth daily. 30 tablet 3  . potassium chloride SA (K-DUR,KLOR-CON) 20 MEQ tablet Take 1 tablet (20 mEq total) by mouth daily.    [name] warfarin (COUMADIN) 5 MG tablet Take 1 and 1/2 tablet daily except 2 tablet on Wednesday,or as directed 60 tablet 3   No current facility-administered medications for this visit.       Physical Exam: BP 126/90 mmHg  Pulse 100  Resp 20  Ht 5\' 5"  (1.651 m)  Wt 247 lb (112.038 kg)  BMI 41.10 kg/m2  SpO2 97%  General appearance: alert, cooperative and no distress Heart: regular rate and rhythm Lungs: clear to auscultation bilaterally Abdomen: obese, benign exam Extremities: no edema Wound: The left EVH incision in the mid thigh shows slight skin maceration but no cellulitis or drainage.   Diagnostic Studies & Laboratory data:         Recent Radiology Findings: Dg Chest 2 View  09/16/2014   CLINICAL  DATA:  Status post CABG and aortic valve replacement wall July 24, 2014, currently no chest complaints. History of asthma and non-Hodgkin's lymphoma  EXAM: CHEST  2 VIEW  COMPARISON:  PA and lateral chest x-ray of August 20, 2014  FINDINGS: The lungs are adequately inflated. There is no focal infiltrate. Hazy density over the left heart border has resolved. The cardiac silhouette is normal in size. The pulmonary vascularity is not engorged. There is mild tortuosity of the descending thoracic aorta. There are coronary artery bypass graft markers and an aortic valve ring present. There are 6 intact sternal wires. The bony thorax exhibits no acute abnormality.  IMPRESSION: There is no CHF nor other acute cardiopulmonary abnormality.   Electronically Signed   By: David  Martinique   On: 09/16/2014 13:49      I have independently reviewed the above radiology findings and reviewed findings  with the patient.  Recent Labs: Lab Results  Component Value Date   WBC 13.7* 07/27/2014   HGB 8.7* 07/27/2014   HCT 26.4* 07/27/2014   PLT 156 07/27/2014   GLUCOSE 137* 07/28/2014   CHOL 161 07/12/2014   TRIG 89 07/12/2014   HDL 33* 07/12/2014   LDLCALC 110* 07/12/2014   ALT 20 07/22/2014   AST 25 07/22/2014   NA 136 07/28/2014   K 4.2 07/28/2014   CL 101 07/28/2014   CREATININE 0.98 07/28/2014   BUN 16 07/28/2014   CO2 28 07/28/2014   TSH 3.376 07/12/2014   INR 1.5 09/16/2014   HGBA1C 5.7* 07/22/2014      Assessment / Plan:  The patient is doing well. There is no specific surgical issues that are causing current difficulties. He continues to get his PT-INR checked. He will follow-up with Dr. Quay Burow for cardiology.   We will see again in 2 months for recheck or as needed if any surgically related issues require.    Keaundra Stehle E 09/16/2014 2:19 PM

## 2014-09-16 NOTE — Patient Instructions (Signed)
The patient is given verbal instructions regarding ongoing rehabilitation in recovery.

## 2014-09-23 ENCOUNTER — Ambulatory Visit (INDEPENDENT_AMBULATORY_CARE_PROVIDER_SITE_OTHER): Payer: BLUE CROSS/BLUE SHIELD | Admitting: Pharmacist Clinician (PhC)/ Clinical Pharmacy Specialist

## 2014-09-23 DIAGNOSIS — Z952 Presence of prosthetic heart valve: Secondary | ICD-10-CM

## 2014-09-23 DIAGNOSIS — Z954 Presence of other heart-valve replacement: Secondary | ICD-10-CM | POA: Diagnosis not present

## 2014-09-23 DIAGNOSIS — Z7901 Long term (current) use of anticoagulants: Secondary | ICD-10-CM | POA: Diagnosis not present

## 2014-09-23 DIAGNOSIS — I4891 Unspecified atrial fibrillation: Secondary | ICD-10-CM

## 2014-09-23 LAB — POCT INR: INR: 2.9

## 2014-09-30 ENCOUNTER — Ambulatory Visit (INDEPENDENT_AMBULATORY_CARE_PROVIDER_SITE_OTHER): Payer: BLUE CROSS/BLUE SHIELD | Admitting: Pharmacist Clinician (PhC)/ Clinical Pharmacy Specialist

## 2014-09-30 DIAGNOSIS — I4891 Unspecified atrial fibrillation: Secondary | ICD-10-CM | POA: Diagnosis not present

## 2014-09-30 DIAGNOSIS — Z952 Presence of prosthetic heart valve: Secondary | ICD-10-CM

## 2014-09-30 DIAGNOSIS — Z7901 Long term (current) use of anticoagulants: Secondary | ICD-10-CM | POA: Diagnosis not present

## 2014-09-30 DIAGNOSIS — Z954 Presence of other heart-valve replacement: Secondary | ICD-10-CM | POA: Diagnosis not present

## 2014-09-30 LAB — POCT INR: INR: 2.2

## 2014-10-07 ENCOUNTER — Ambulatory Visit (INDEPENDENT_AMBULATORY_CARE_PROVIDER_SITE_OTHER): Payer: BLUE CROSS/BLUE SHIELD | Admitting: Pharmacist Clinician (PhC)/ Clinical Pharmacy Specialist

## 2014-10-07 DIAGNOSIS — Z954 Presence of other heart-valve replacement: Secondary | ICD-10-CM

## 2014-10-07 DIAGNOSIS — Z7901 Long term (current) use of anticoagulants: Secondary | ICD-10-CM | POA: Diagnosis not present

## 2014-10-07 DIAGNOSIS — I4891 Unspecified atrial fibrillation: Secondary | ICD-10-CM

## 2014-10-07 DIAGNOSIS — Z952 Presence of prosthetic heart valve: Secondary | ICD-10-CM

## 2014-10-07 LAB — POCT INR: INR: 3.3

## 2014-10-21 ENCOUNTER — Ambulatory Visit (INDEPENDENT_AMBULATORY_CARE_PROVIDER_SITE_OTHER): Payer: BLUE CROSS/BLUE SHIELD | Admitting: Pharmacist Clinician (PhC)/ Clinical Pharmacy Specialist

## 2014-10-21 DIAGNOSIS — Z7901 Long term (current) use of anticoagulants: Secondary | ICD-10-CM | POA: Diagnosis not present

## 2014-10-21 DIAGNOSIS — Z952 Presence of prosthetic heart valve: Secondary | ICD-10-CM

## 2014-10-21 DIAGNOSIS — I4891 Unspecified atrial fibrillation: Secondary | ICD-10-CM | POA: Diagnosis not present

## 2014-10-21 DIAGNOSIS — Z954 Presence of other heart-valve replacement: Secondary | ICD-10-CM

## 2014-10-21 LAB — POCT INR: INR: 1.7

## 2014-10-21 MED ORDER — POTASSIUM CHLORIDE CRYS ER 20 MEQ PO TBCR: 20.0000 meq | EXTENDED_RELEASE_TABLET | Freq: Every day | ORAL | Status: DC

## 2014-11-01 ENCOUNTER — Encounter: Payer: Self-pay | Admitting: Cardiovascular Disease

## 2014-11-01 ENCOUNTER — Ambulatory Visit (INDEPENDENT_AMBULATORY_CARE_PROVIDER_SITE_OTHER): Payer: BLUE CROSS/BLUE SHIELD | Admitting: Cardiovascular Disease

## 2014-11-01 ENCOUNTER — Ambulatory Visit (INDEPENDENT_AMBULATORY_CARE_PROVIDER_SITE_OTHER): Payer: BLUE CROSS/BLUE SHIELD | Admitting: Pharmacist Clinician (PhC)/ Clinical Pharmacy Specialist

## 2014-11-01 VITALS — BP 148/88 | HR 92 | Ht 65.0 in | Wt 258.4 lb

## 2014-11-01 DIAGNOSIS — I351 Nonrheumatic aortic (valve) insufficiency: Secondary | ICD-10-CM

## 2014-11-01 DIAGNOSIS — Z7901 Long term (current) use of anticoagulants: Secondary | ICD-10-CM | POA: Diagnosis not present

## 2014-11-01 DIAGNOSIS — Z951 Presence of aortocoronary bypass graft: Secondary | ICD-10-CM | POA: Diagnosis not present

## 2014-11-01 DIAGNOSIS — Z952 Presence of prosthetic heart valve: Secondary | ICD-10-CM

## 2014-11-01 DIAGNOSIS — I4891 Unspecified atrial fibrillation: Secondary | ICD-10-CM | POA: Diagnosis not present

## 2014-11-01 DIAGNOSIS — I25810 Atherosclerosis of coronary artery bypass graft(s) without angina pectoris: Secondary | ICD-10-CM

## 2014-11-01 DIAGNOSIS — E785 Hyperlipidemia, unspecified: Secondary | ICD-10-CM

## 2014-11-01 DIAGNOSIS — I519 Heart disease, unspecified: Secondary | ICD-10-CM

## 2014-11-01 DIAGNOSIS — Z954 Presence of other heart-valve replacement: Secondary | ICD-10-CM | POA: Diagnosis not present

## 2014-11-01 DIAGNOSIS — I214 Non-ST elevation (NSTEMI) myocardial infarction: Secondary | ICD-10-CM | POA: Diagnosis not present

## 2014-11-01 DIAGNOSIS — I10 Essential (primary) hypertension: Secondary | ICD-10-CM

## 2014-11-01 DIAGNOSIS — I480 Paroxysmal atrial fibrillation: Secondary | ICD-10-CM | POA: Diagnosis not present

## 2014-11-01 DIAGNOSIS — Z79899 Other long term (current) drug therapy: Secondary | ICD-10-CM

## 2014-11-01 LAB — POCT INR: INR: 2.2

## 2014-11-01 MED ORDER — AMIODARONE HCL 200 MG PO TABS: 100.0000 mg | ORAL_TABLET | Freq: Every day | ORAL | Status: DC

## 2014-11-01 MED ORDER — CARVEDILOL 6.25 MG PO TABS: 6.2500 mg | ORAL_TABLET | Freq: Every day | ORAL | Status: DC

## 2014-11-01 NOTE — Assessment & Plan Note (Signed)
History of hypertension blood pressure measured today at 148/88. He is on carvedilol.. Continue current meds at current dosing. We may consider adding a low-dose ACE inhibitor at his next neck office visit.

## 2014-11-01 NOTE — Assessment & Plan Note (Signed)
History of CAD status post coronary artery bypass grafting by Dr. Merilynn Finland 07/24/14 after a cardiac catheterization performed by myself on 07/15/14 revealed LAD diagonal branch bifurcation disease. He had a LIMA to his LAD and a vein graft to the diagonal branch.

## 2014-11-01 NOTE — Assessment & Plan Note (Signed)
History of A. Fib preoperatively on Coumadin anticoagulation. He currently appears to be in sinus rhythm at least by his last EKG on 09/09/14. He is on Coumadin anticoagulation as well as amiodarone 200 mg a day and low-dose carvedilol. I'm going to increase his carvedilol to 6.25 mg by mouth twice a day, cut his amiodarone and half. We may ultimately be able to discontinue his amiodarone.

## 2014-11-01 NOTE — Patient Instructions (Signed)
Dr Gwenlyn Found has recommended making the following medication changes: INCREASE Carvedilol to 6.25 mg - you may take 2 tablets twice daily until that bottle runs out. A new prescription has been sent to your pharmacy electronically. When you pick up that new prescription, follow the directions listed on the bottle - take 1 tablet by mouth twice daily. DECREASE Amiodarone to 100 mg daily - take 0.5 tablet by mouth daily.  Your physician has requested that you have an echocardiogram. Echocardiography is a painless test that uses sound waves to create images of your heart. It provides your doctor with information about the size and shape of your heart and how well your heart's chambers and valves are working. This procedure takes approximately one hour. There are no restrictions for this procedure.  Your physician recommends that you return for lab work at your earliest [place].  Your physician recommends that you schedule a follow-up appointment in 3 months with [name], NP.  Dr Gwenlyn Found recommends that you schedule a follow-up appointment in 6 months. You will receive a reminder letter in the mail two months in advance. If you don't receive a letter, please call our office to schedule the follow-up appointment.

## 2014-11-01 NOTE — Assessment & Plan Note (Signed)
History of moderate to severe aortic insufficiency status post bioprosthetic aortic valve replacement with a #25 Perimount bovine pericardial valve by Dr. Roxan Hockey on 07/24/14 at the time of bypass surgery. I'm going to check a 2-D echocardiogram as a baseline for his new aortic valve

## 2014-11-01 NOTE — Progress Notes (Signed)
11/01/2014 Donald Escobar   [birth year]  [id number]  Primary Physician No PCP Per Patient Primary Cardiologist: [name] MD Renae Gloss   HPI:  Donald Escobar is a 59 year old mildly overweight single  Caucasian male with no children who works in the [employer]. He was admitted with congestive heart failure on 07/11/14. He ruled out for myocardial infarction. A 2-D echocardiogram revealed moderately severe LV dysfunction with a globally dilated LV and EF of 25%. He also had moderate to severe aortic insufficiency. He had packed his mitral fibrillation. He underwent right and left heart cardiac catheterization by myself on 07/15/14 revealing LAD diagonal branch disease, moderate LV dysfunction with moderate to severe aortic insufficiency. 10 days later he underwent coronary artery bypass grafting with a LIMA to his LAD, vein to diagonal branch and a bovine pericardial aortic valve replacement. He was discharged home on amiodarone, Coumadin anticoagulation. He saw [name] registered nurse practitioner in the office on 09/09/14 which time he was in sinus rhythm. He never participated in cardiac rehabilitation. He feels clinically improved.   Current Outpatient Prescriptions  Medication Sig Dispense Refill  . albuterol (PROVENTIL HFA;VENTOLIN HFA) 108 (90 BASE) MCG/ACT inhaler Inhale 2 puffs into the lungs every 6 (six) hours as needed for wheezing or shortness of breath.     [name] amiodarone (PACERONE) 200 MG tablet Take 0.5 tablets (100 mg total) by mouth daily. 15 tablet 11  . aspirin EC 81 MG EC tablet Take 1 tablet (81 mg total) by mouth daily. 30 tablet 6  . atorvastatin (LIPITOR) 40 MG tablet Take 1 tablet (40 mg total) by mouth every evening. 30 tablet 6  . carvedilol (COREG) 6.25 MG tablet Take 1 tablet (6.25 mg total) by mouth daily. 60 tablet 11  . cholecalciferol (VITAMIN D) 1000 UNITS tablet Take 1,000 Units by mouth daily.    . furosemide (LASIX) 40 MG tablet Take 1  tablet (40 mg total) by mouth daily. 30 tablet 3  . potassium chloride SA (K-DUR,KLOR-CON) 20 MEQ tablet Take 1 tablet (20 mEq total) by mouth daily. 30 tablet 5  . warfarin (COUMADIN) 5 MG tablet Take 1 and 1/2 tablet daily except 2 tablet on Wednesday,or as directed 60 tablet 3   No current facility-administered medications for this visit.    Allergies  Allergen Reactions  . Sulfa Antibiotics Swelling    Lip swelling    History   Social History  . Marital Status: Single    Spouse Name: N/A  . Number of Children: N/A  . Years of Education: N/A   Occupational History  . Not on file.   Social History Main Topics  . Smoking status: Never Smoker   . Smokeless tobacco: Never Used  . Alcohol Use: No  . Drug Use: No  . Sexual Activity: No   Other Topics Concern  . Not on file   Social History Narrative     Review of Systems: General: negative for chills, fever, night sweats or weight changes.  Cardiovascular: negative for chest pain, dyspnea on exertion, edema, orthopnea, palpitations, paroxysmal nocturnal dyspnea or shortness of breath Dermatological: negative for rash Respiratory: negative for cough or wheezing Urologic: negative for hematuria Abdominal: negative for nausea, vomiting, diarrhea, bright red blood per rectum, melena, or hematemesis Neurologic: negative for visual changes, syncope, or dizziness All other systems reviewed and are otherwise negative except as noted above.    Blood pressure 148/88, pulse 92, height 5\' 5"  (1.651 m), weight 258  lb 6.4 oz (117.209 kg).  General appearance: alert and no distress Neck: no adenopathy, no carotid bruit, no JVD, supple, symmetrical, trachea midline and thyroid not enlarged, symmetric, no tenderness/mass/nodules Lungs: clear to auscultation bilaterally Heart: regular rate and rhythm, S1, S2 normal, no murmur, click, rub or gallop Extremities: extremities normal, atraumatic, no cyanosis or edema  EKG not performed  today  ASSESSMENT AND PLAN:   S/P CABG x 2 History of CAD status post coronary artery bypass grafting by Dr. Merilynn Finland 07/24/14 after a cardiac catheterization performed by myself on 07/15/14 revealed LAD diagonal branch bifurcation disease. He had a LIMA to his LAD and a vein graft to the diagonal branch.   Hypertension History of hypertension blood pressure measured today at 148/88. He is on carvedilol.. Continue current meds at current dosing. We may consider adding a low-dose ACE inhibitor at his next neck office visit.   Hyperlipemia History of hyperlipidemia on atorvastatin 40 mg a day with recent lipid profile performed 07/12/14 revealed a total cholesterol 61, LDL 110 and HDL 33. We will recheck a lipid and liver profile   Atrial fibrillation [I48.91] History of A. Fib preoperatively on Coumadin anticoagulation. He currently appears to be in sinus rhythm at least by his last EKG on 09/09/14. He is on Coumadin anticoagulation as well as amiodarone 200 mg a day and low-dose carvedilol. I'm going to increase his carvedilol to 6.25 mg by mouth twice a day, cut his amiodarone and half. We may ultimately be able to discontinue his amiodarone.   Aortic insufficiency History of moderate to severe aortic insufficiency status post bioprosthetic aortic valve replacement with a #25 [place] bovine pericardial valve by Dr. Roxan Hockey on 07/24/14 at the time of bypass surgery. I'm going to check a 2-D echocardiogram as a baseline for his new aortic valve       [name] MD [facility], [facility] 11/01/2014 10:09 AM

## 2014-11-01 NOTE — Assessment & Plan Note (Signed)
History of hyperlipidemia on atorvastatin 40 mg a day with recent lipid profile performed 07/12/14 revealed a total cholesterol 61, LDL 110 and HDL 33. We will recheck a lipid and liver profile

## 2014-11-22 ENCOUNTER — Inpatient Hospital Stay (HOSPITAL_COMMUNITY): Admission: RE | Admit: 2014-11-22 | Payer: BLUE CROSS/BLUE SHIELD | Source: Ambulatory Visit

## 2014-11-22 ENCOUNTER — Ambulatory Visit: Payer: BLUE CROSS/BLUE SHIELD | Admitting: Pharmacist Clinician (PhC)/ Clinical Pharmacy Specialist

## 2014-11-25 ENCOUNTER — Other Ambulatory Visit: Payer: Self-pay | Admitting: Thoracic Surgery (Cardiothoracic Vascular Surgery)

## 2014-11-25 DIAGNOSIS — Z951 Presence of aortocoronary bypass graft: Secondary | ICD-10-CM

## 2014-11-26 ENCOUNTER — Encounter: Payer: Self-pay | Admitting: Thoracic Surgery (Cardiothoracic Vascular Surgery)

## 2014-11-26 ENCOUNTER — Ambulatory Visit (INDEPENDENT_AMBULATORY_CARE_PROVIDER_SITE_OTHER): Payer: BLUE CROSS/BLUE SHIELD | Admitting: Thoracic Surgery (Cardiothoracic Vascular Surgery)

## 2014-11-26 ENCOUNTER — Ambulatory Visit
Admission: RE | Admit: 2014-11-26 | Discharge: 2014-11-26 | Disposition: A | Discharge status: Home / Self Care | Payer: BLUE CROSS/BLUE SHIELD | Source: Ambulatory Visit | Attending: Thoracic Surgery (Cardiothoracic Vascular Surgery) | Admitting: Thoracic Surgery (Cardiothoracic Vascular Surgery)

## 2014-11-26 VITALS — BP 169/109 | HR 69 | Resp 20 | Ht 65.0 in | Wt 258.0 lb

## 2014-11-26 DIAGNOSIS — Z951 Presence of aortocoronary bypass graft: Secondary | ICD-10-CM

## 2014-11-26 DIAGNOSIS — I10 Essential (primary) hypertension: Secondary | ICD-10-CM

## 2014-11-26 DIAGNOSIS — Z954 Presence of other heart-valve replacement: Secondary | ICD-10-CM | POA: Diagnosis not present

## 2014-11-26 DIAGNOSIS — Z952 Presence of prosthetic heart valve: Secondary | ICD-10-CM

## 2014-11-26 MED ORDER — CARVEDILOL 6.25 MG PO TABS: 12.5000 mg | ORAL_TABLET | Freq: Two times a day (BID) | ORAL | Status: DC

## 2014-11-26 NOTE — Progress Notes (Signed)
[address]       [place] [address]             [id number]       HPI:  [name] returns today for a scheduled follow-up visit.  He is a 59 year old man who presented earlier this year with congestive heart failure. He had coronary bypass grafting 2 and aortic valve replacement with a Peri-mount bovine pericardial valve on 07/24/2014. He had atrial fibrillation postoperatively and was discharged home on amiodarone and Coumadin. Other than that his postoperative course was uncomplicated.  He recently saw Dr. Gwenlyn Found. His Coreg dose was increased and his amiodarone dose was decreased.  He says that since his last visit he is return to work. He has also noted some significant weight gain. He is not having any significant incisional pain. He has some stress related to work but has not had any physical limitations with it.  Past Medical History  Diagnosis Date  . Asthma   . Non Hodgkin's lymphoma 2003    a.  tx with anthracycline.  . Hypertension   . Chronic systolic CHF (congestive heart failure)     a. 2D Echo 07/13/14 - EF 25-30%, severe diffuse HK with WMA< grade 2 DD, mild AS, mod-severe AI. b. TEE 07/15/14 - EF 40-45%, severe AI.  [name] Aortic insufficiency   . CAD (coronary artery disease)     a. Small NSTEMI 06/2014: cath 07/15/14: 90% prox LAD diagonal branch bifurcation disease.  . Palpitations     a. 06/2014: telemetry significant for brief irregular wide complex tachycardia (question AF/NSVT), and narrow complex irregular tachycardia with P wave activity (question PAT). Quiescent after BB initiation.  . Abnormal CT scan     a. 06/2014: Incidental findings on CTA which will need follow-up per primary care - 43mm R middle lobe lung nodule noted on CTA, heterogeneous adrenal adenoma noted at L adrenal gland, 53mm nonobstructing stone in upper pole of L kidney, scattered hepatic cysts.  . Hyperlipemia   . Morbid obesity   . Shortness of breath dyspnea     With  exertion.  . Pneumonia 2003  . Personal history of kidney stones     current - no pain      Current Outpatient Prescriptions  Medication Sig Dispense Refill  . albuterol (PROVENTIL HFA;VENTOLIN HFA) 108 (90 BASE) MCG/ACT inhaler Inhale 2 puffs into the lungs every 6 (six) hours as needed for wheezing or shortness of breath.     [name] amiodarone (PACERONE) 200 MG tablet Take 0.5 tablets (100 mg total) by mouth daily. 15 tablet 11  . aspirin EC 81 MG EC tablet Take 1 tablet (81 mg total) by mouth daily. 30 tablet 6  . atorvastatin (LIPITOR) 40 MG tablet Take 1 tablet (40 mg total) by mouth every evening. 30 tablet 6  . carvedilol (COREG) 6.25 MG tablet Take 2 tablets (12.5 mg total) by mouth 2 (two) times daily with a meal. 60 tablet 11  . cholecalciferol (VITAMIN D) 1000 UNITS tablet Take 1,000 Units by mouth daily.    . furosemide (LASIX) 40 MG tablet Take 1 tablet (40 mg total) by mouth daily. 30 tablet 3  . potassium chloride SA (K-DUR,KLOR-CON) 20 MEQ tablet Take 1 tablet (20 mEq total) by mouth daily. 30 tablet 5  . warfarin (COUMADIN) 5 MG tablet Take 1 and 1/2 tablet daily except 2 tablet on Wednesday,or as directed 60 tablet 3   No current facility-administered medications  for this visit.    Physical Exam BP 169/109 mmHg  Pulse 69  Resp 20  Ht 5\' 5"  (1.651 m)  Wt 258 lb (117.028 kg)  BMI 42.93 kg/m2  SpO27 18% 59 year old obese man in no acute distress Alert and oriented 3 with no focal deficits Cardiac regular rate and rhythm normal S1 and S2, 2/6 systolic murmur Sternal incision healing well, hyperpigmentation of incision Lungs clear with equal breath sounds bilaterally Peripheral edema  Diagnostic Tests: Chest x-ray reviewed. There are postoperative changes. There is no significant effusion or infiltrate.  Impression: 59 year old man who is now 4 months out from aortic valve replacement and coronary bypass grafting 2. He's doing well at this time clinically. He has  not had any episodes of atrial fibrillation that he is aware of. His last INR was 2.2.  The major issue I see today is his hypertension. His blood pressure was 169/109. He was also hypertensive that his last visit with Dr. Gwenlyn Found. His Coreg dose was increased at that time. I recommended that he increase his Coreg to 12.5 mg twice a day (from 6.25 mg twice a day).  He may apply Mederma or vitamin E his sternal incision if he desires, but it is healing normally.  Plan: I will follow up with Dr. Quay Burow  I will be happy to see him back if I can be of any further assistance with his care.  Melrose Nakayama, MD [facility] [phone]  I spent 15 minutes with Mr. Welty during this visit.

## 2014-12-09 ENCOUNTER — Telehealth: Payer: Self-pay | Admitting: Cardiovascular Disease

## 2014-12-09 NOTE — Telephone Encounter (Signed)
[name] is calling wanting to know if [name] is needing any pre-meds before a cleaning , Patient is in the office now .[name] Please Call  Thanks

## 2014-12-09 NOTE — Telephone Encounter (Signed)
Returned call to [place] with Donald Escobar's office.Patient will need amoxicillin 2 grams 1 hour before dental cleaning.

## 2015-01-15 ENCOUNTER — Telehealth (HOSPITAL_COMMUNITY): Payer: Self-pay | Admitting: Cardiac Rehabilitation

## 2015-01-15 NOTE — Telephone Encounter (Signed)
Multiple attempts to contact pt to enroll in outpatient cardiac rehab. Pt did not respond to voice message or letter mailed.

## 2015-01-18 ENCOUNTER — Other Ambulatory Visit: Payer: Self-pay | Admitting: Physician Assistant

## 2015-01-20 NOTE — Telephone Encounter (Signed)
Rx(s) sent to pharmacy electronically.  

## 2015-02-19 ENCOUNTER — Other Ambulatory Visit: Payer: Self-pay | Admitting: Cardiology

## 2015-04-30 ENCOUNTER — Telehealth: Payer: Self-pay | Admitting: Pharmacist Clinician (PhC)/ Clinical Pharmacy Specialist

## 2015-04-30 NOTE — Telephone Encounter (Signed)
Closed encounter °

## 2015-05-09 ENCOUNTER — Other Ambulatory Visit: Payer: Self-pay | Admitting: Cardiovascular Disease

## 2015-05-19 ENCOUNTER — Ambulatory Visit (INDEPENDENT_AMBULATORY_CARE_PROVIDER_SITE_OTHER): Payer: BLUE CROSS/BLUE SHIELD | Admitting: Pharmacist Clinician (PhC)/ Clinical Pharmacy Specialist

## 2015-05-19 DIAGNOSIS — I4891 Unspecified atrial fibrillation: Secondary | ICD-10-CM | POA: Diagnosis not present

## 2015-05-19 DIAGNOSIS — Z952 Presence of prosthetic heart valve: Secondary | ICD-10-CM

## 2015-05-19 DIAGNOSIS — Z7901 Long term (current) use of anticoagulants: Secondary | ICD-10-CM | POA: Diagnosis not present

## 2015-05-19 DIAGNOSIS — I480 Paroxysmal atrial fibrillation: Secondary | ICD-10-CM | POA: Diagnosis not present

## 2015-05-19 DIAGNOSIS — Z954 Presence of other heart-valve replacement: Secondary | ICD-10-CM

## 2015-05-19 LAB — POCT INR: INR: 1.1

## 2015-05-19 MED ORDER — ATORVASTATIN CALCIUM 40 MG PO TABS: 40.0000 mg | ORAL_TABLET | Freq: Every evening | ORAL | Status: DC

## 2015-05-19 MED ORDER — WARFARIN SODIUM 7.5 MG PO TABS: ORAL_TABLET | ORAL | Status: DC

## 2015-06-02 ENCOUNTER — Ambulatory Visit (INDEPENDENT_AMBULATORY_CARE_PROVIDER_SITE_OTHER): Payer: BLUE CROSS/BLUE SHIELD | Admitting: Pharmacist Clinician (PhC)/ Clinical Pharmacy Specialist

## 2015-06-02 DIAGNOSIS — I480 Paroxysmal atrial fibrillation: Secondary | ICD-10-CM

## 2015-06-02 DIAGNOSIS — Z954 Presence of other heart-valve replacement: Secondary | ICD-10-CM | POA: Diagnosis not present

## 2015-06-02 DIAGNOSIS — Z7901 Long term (current) use of anticoagulants: Secondary | ICD-10-CM

## 2015-06-02 DIAGNOSIS — I4891 Unspecified atrial fibrillation: Secondary | ICD-10-CM

## 2015-06-02 DIAGNOSIS — Z952 Presence of prosthetic heart valve: Secondary | ICD-10-CM

## 2015-06-02 LAB — POCT INR: INR: 2.1

## 2015-06-04 ENCOUNTER — Other Ambulatory Visit: Payer: Self-pay | Admitting: Cardiovascular Disease

## 2015-06-04 NOTE — Telephone Encounter (Signed)
[name], MD at 11/01/2014 10:09 AM  furosemide (LASIX) 40 MG tablet Take 1 tablet (40 mg total) by mouth daily

## 2015-06-08 ENCOUNTER — Other Ambulatory Visit: Payer: Self-pay | Admitting: Cardiovascular Disease

## 2015-06-18 ENCOUNTER — Ambulatory Visit (INDEPENDENT_AMBULATORY_CARE_PROVIDER_SITE_OTHER): Payer: BLUE CROSS/BLUE SHIELD | Admitting: Pharmacist Clinician (PhC)/ Clinical Pharmacy Specialist

## 2015-06-18 DIAGNOSIS — Z952 Presence of prosthetic heart valve: Secondary | ICD-10-CM

## 2015-06-18 DIAGNOSIS — Z954 Presence of other heart-valve replacement: Secondary | ICD-10-CM | POA: Diagnosis not present

## 2015-06-18 DIAGNOSIS — I480 Paroxysmal atrial fibrillation: Secondary | ICD-10-CM

## 2015-06-18 DIAGNOSIS — I4891 Unspecified atrial fibrillation: Secondary | ICD-10-CM

## 2015-06-18 DIAGNOSIS — Z7901 Long term (current) use of anticoagulants: Secondary | ICD-10-CM

## 2015-06-18 LAB — POCT INR: INR: 1.8

## 2015-06-27 ENCOUNTER — Encounter: Payer: Self-pay | Admitting: Cardiovascular Disease

## 2015-07-01 ENCOUNTER — Ambulatory Visit: Payer: BLUE CROSS/BLUE SHIELD | Admitting: Pharmacist Clinician (PhC)/ Clinical Pharmacy Specialist

## 2015-07-04 ENCOUNTER — Ambulatory Visit: Payer: BLUE CROSS/BLUE SHIELD | Admitting: Pharmacist Clinician (PhC)/ Clinical Pharmacy Specialist

## 2015-07-09 ENCOUNTER — Ambulatory Visit (INDEPENDENT_AMBULATORY_CARE_PROVIDER_SITE_OTHER): Payer: BLUE CROSS/BLUE SHIELD | Admitting: Pharmacist Clinician (PhC)/ Clinical Pharmacy Specialist

## 2015-07-09 DIAGNOSIS — I4891 Unspecified atrial fibrillation: Secondary | ICD-10-CM

## 2015-07-09 DIAGNOSIS — Z7901 Long term (current) use of anticoagulants: Secondary | ICD-10-CM | POA: Diagnosis not present

## 2015-07-09 DIAGNOSIS — Z954 Presence of other heart-valve replacement: Secondary | ICD-10-CM | POA: Diagnosis not present

## 2015-07-09 DIAGNOSIS — I480 Paroxysmal atrial fibrillation: Secondary | ICD-10-CM | POA: Diagnosis not present

## 2015-07-09 DIAGNOSIS — Z952 Presence of prosthetic heart valve: Secondary | ICD-10-CM

## 2015-07-09 LAB — POCT INR: INR: 1.7

## 2015-07-09 MED ORDER — CARVEDILOL 12.5 MG PO TABS: 12.5000 mg | ORAL_TABLET | Freq: Two times a day (BID) | ORAL | Status: DC

## 2015-07-18 ENCOUNTER — Other Ambulatory Visit: Payer: Self-pay | Admitting: Cardiovascular Disease

## 2015-07-18 NOTE — Telephone Encounter (Signed)
Rx request sent to pharmacy.  

## 2015-07-21 ENCOUNTER — Other Ambulatory Visit: Payer: Self-pay | Admitting: Cardiovascular Disease

## 2015-07-23 ENCOUNTER — Encounter: Payer: BLUE CROSS/BLUE SHIELD | Admitting: Pharmacist Clinician (PhC)/ Clinical Pharmacy Specialist

## 2015-07-25 ENCOUNTER — Ambulatory Visit (INDEPENDENT_AMBULATORY_CARE_PROVIDER_SITE_OTHER): Payer: BLUE CROSS/BLUE SHIELD | Admitting: Pharmacist Clinician (PhC)/ Clinical Pharmacy Specialist

## 2015-07-25 ENCOUNTER — Ambulatory Visit (INDEPENDENT_AMBULATORY_CARE_PROVIDER_SITE_OTHER): Payer: BLUE CROSS/BLUE SHIELD | Admitting: Family Medicine

## 2015-07-25 VITALS — BP 136/82 | HR 60 | Temp 97.6°F | Resp 17 | Ht 67.0 in | Wt 267.0 lb

## 2015-07-25 DIAGNOSIS — Z7901 Long term (current) use of anticoagulants: Secondary | ICD-10-CM | POA: Diagnosis not present

## 2015-07-25 DIAGNOSIS — I4891 Unspecified atrial fibrillation: Secondary | ICD-10-CM

## 2015-07-25 DIAGNOSIS — J029 Acute pharyngitis, unspecified: Secondary | ICD-10-CM | POA: Diagnosis not present

## 2015-07-25 DIAGNOSIS — R0981 Nasal congestion: Secondary | ICD-10-CM | POA: Diagnosis not present

## 2015-07-25 DIAGNOSIS — J069 Acute upper respiratory infection, unspecified: Secondary | ICD-10-CM

## 2015-07-25 DIAGNOSIS — Z954 Presence of other heart-valve replacement: Secondary | ICD-10-CM | POA: Diagnosis not present

## 2015-07-25 DIAGNOSIS — R05 Cough: Secondary | ICD-10-CM | POA: Diagnosis not present

## 2015-07-25 DIAGNOSIS — R059 Cough, unspecified: Secondary | ICD-10-CM

## 2015-07-25 DIAGNOSIS — Z952 Presence of prosthetic heart valve: Secondary | ICD-10-CM

## 2015-07-25 DIAGNOSIS — I480 Paroxysmal atrial fibrillation: Secondary | ICD-10-CM | POA: Diagnosis not present

## 2015-07-25 LAB — POCT RAPID STREP A (OFFICE): Rapid Strep A Screen: NEGATIVE

## 2015-07-25 LAB — POCT INR: INR: 3.2

## 2015-07-25 MED ORDER — AZITHROMYCIN 250 MG PO TABS: ORAL_TABLET | ORAL | Status: DC

## 2015-07-25 MED ORDER — BENZONATATE 100 MG PO CAPS: 200.0000 mg | ORAL_CAPSULE | Freq: Two times a day (BID) | ORAL | Status: DC | PRN

## 2015-07-25 NOTE — Progress Notes (Signed)
Chief Complaint:  Chief Complaint  Patient presents with  . Nasal Congestion  . URI    HPI: Donald Escobar is a 60 y.o. male who reports to [facility] today complaining of sinus congestion, cough, productive but  no fevers or chills, ear fullness, no rashes , no nausea, vomiting, abd pain, + diarrhea. Episode of x 1 nonbloody.  He has taken some alaseltzer cold. Sore throat. No msk aches, he did not get flu vaccine. No rashes.   Has asthma   Past Medical History  Diagnosis Date  . Asthma   . Non Hodgkin's lymphoma ([place]) 2003    a.  tx with anthracycline.  . Hypertension   . Chronic systolic CHF (congestive heart failure) ([place])     a. 2D Echo 07/13/14 - EF 25-30%, severe diffuse HK with WMA< grade 2 DD, mild AS, mod-severe AI. b. TEE 07/15/14 - EF 40-45%, severe AI.  [name] Aortic insufficiency   . CAD (coronary artery disease)     a. Small NSTEMI 06/2014: cath 07/15/14: 90% prox LAD diagonal branch bifurcation disease.  . Palpitations     a. 06/2014: telemetry significant for brief irregular wide complex tachycardia (question AF/NSVT), and narrow complex irregular tachycardia with P wave activity (question PAT). Quiescent after BB initiation.  . Abnormal CT scan     a. 06/2014: Incidental findings on CTA which will need follow-up per primary care - 91mm R middle lobe lung nodule noted on CTA, heterogeneous adrenal adenoma noted at L adrenal gland, 17mm nonobstructing stone in upper pole of L kidney, scattered hepatic cysts.  . Hyperlipemia   . Morbid obesity ([place])   . Shortness of breath dyspnea     With exertion.  . Pneumonia 2003  . Personal history of kidney stones     current - no pain   Past Surgical History  Procedure Laterality Date  . Left and right heart catheterization with coronary angiogram N/A 07/15/2014    Procedure: LEFT AND RIGHT HEART CATHETERIZATION WITH CORONARY ANGIOGRAM;  Surgeon: [name], MD;  Location: [facility] CATH LAB;  Service: Cardiovascular;  Laterality: N/A;  .  Tee without cardioversion N/A 07/15/2014    Procedure: TRANSESOPHAGEAL ECHOCARDIOGRAM (TEE);  Surgeon: Thayer Headings, MD;  Location: [place];  Service: Cardiovascular;  Laterality: N/A;  . Coronary artery bypass graft N/A 07/24/2014    Procedure: CORONARY ARTERY BYPASS GRAFTING (CABG), ON PUMP, TIMES TWO, USING LEFT INTERNAL MAMMARY ARTERY, LEFT GREATER SAPHENOUS VEIN HARVESTED ENDOSCOPICALLY;  Surgeon: Melrose Nakayama, MD;  Location: [place];  Service: Open Heart Surgery;  Laterality: N/A;  . Aortic valve replacement N/A 07/24/2014    Procedure: AORTIC VALVE REPLACEMENT (AVR);  Surgeon: Melrose Nakayama, MD;  Location: [place];  Service: Open Heart Surgery;  Laterality: N/A;  . Tee without cardioversion N/A 07/24/2014    Procedure: TRANSESOPHAGEAL ECHOCARDIOGRAM (TEE);  Surgeon: Melrose Nakayama, MD;  Location: [place];  Service: Open Heart Surgery;  Laterality: N/A;   Social History   Social History  . Marital Status: Single    Spouse Name: N/A  . Number of Children: N/A  . Years of Education: N/A   Social History Main Topics  . Smoking status: Never Smoker   . Smokeless tobacco: Never Used  . Alcohol Use: No  . Drug Use: No  . Sexual Activity: No   Other Topics Concern  . None   Social History Narrative   Family History  Problem Relation Age of Onset  .  Hypertension Mother    Allergies  Allergen Reactions  . Sulfa Antibiotics Swelling    Lip swelling   Prior to Admission medications   Medication Sig Start Date End Date Taking? Authorizing Provider  albuterol (PROVENTIL HFA;VENTOLIN HFA) 108 (90 BASE) MCG/ACT inhaler Inhale 2 puffs into the lungs every 6 (six) hours as needed for wheezing or shortness of breath.    Yes Historical Provider, MD  amiodarone (PACERONE) 200 MG tablet Take 0.5 tablets (100 mg total) by mouth daily. 11/01/14  Yes [name], MD  aspirin EC 81 MG EC tablet Take 1 tablet (81 mg total) by mouth daily. 07/16/14  Yes Dayna N Dunn, PA-C    carvedilol (COREG) 12.5 MG tablet Take 1 tablet (12.5 mg total) by mouth 2 (two) times daily. 07/09/15  Yes [name], MD  cholecalciferol (VITAMIN D) 1000 UNITS tablet Take 1,000 Units by mouth daily.   Yes Historical Provider, MD  furosemide (LASIX) 40 MG tablet Take 1 tablet (40 mg total) by mouth daily. Please schedule appointment for refills. 07/18/15  Yes [name], MD  KLOR-CON M20 20 MEQ tablet TAKE 1 TABLET BY MOUTH DAILY. NEEDS APPOINTMENT FOR FUTURE REFILLS 06/10/15  Yes [name], MD  warfarin (COUMADIN) 7.5 MG tablet Take 1 to 1.5 tablets by mouth daily as directed by coumadin clinic 05/19/15  Yes [name], MD     ROS: The patient denies fevers, chills, night sweats, unintentional weight loss, chest pain, palpitations, wheezing, dyspnea on exertion,  vomiting, abdominal pain, dysuria, hematuria, melena, numbness, weakness, or tingling.   All other systems have been reviewed and were otherwise negative with the exception of those mentioned in the HPI and as above.    PHYSICAL EXAM: Filed Vitals:   07/25/15 1509  BP: 136/82  Pulse: 60  Temp: 97.6 F (36.4 C)  Resp: 17   Body mass index is 41.81 kg/(m^2).   General: Alert, no acute distress HEENT:  Normocephalic, atraumatic, oropharynx patent. EOMI, PERRLA Erythematous throat, no exudates, TM normal, + sinus tenderness, + erythematous/boggy nasal mucosa Cardiovascular:  Regular rate and rhythm, no rubs murmurs or gallops.  Respiratory: Clear to auscultation bilaterally.  No wheezes, rales, or rhonchi.  No cyanosis, no use of accessory musculature Abdominal: No organomegaly, abdomen is soft and non-tender, positive bowel sounds. No masses. Skin: No rashes. Neurologic: Facial musculature symmetric. Psychiatric: Patient acts appropriately throughout our interaction. Lymphatic: No cervical or submandibular lymphadenopathy Musculoskeletal: Gait intact. No edema, tenderness   LABS: Results for  orders placed or performed in visit on 07/25/15  POCT INR  Result Value Ref Range   INR 3.2      EKG/XRAY:   Primary read interpreted by Dr. Marin Comment at [facility].   ASSESSMENT/PLAN: Encounter Diagnoses  Name Primary?  . Acute URI Yes  . Cough   . Sinus congestion   . Acute pharyngitis, unspecified pharyngitis type    HAs taken azithromhycin without any probllems Rx Azithromycin Rx tessalon perles Cont with inhaler prn  Fu prn    Gross sideeffects, risk and benefits, and alternatives of medications d/w patient. Patient is aware that all medications have potential sideeffects and we are unable to predict every sideeffect or drug-drug interaction that may occur.  Thao Le DO  07/25/2015 4:27 PM

## 2015-07-27 LAB — CULTURE, GROUP A STREP: Organism ID, Bacteria: NORMAL

## 2015-07-28 ENCOUNTER — Ambulatory Visit (INDEPENDENT_AMBULATORY_CARE_PROVIDER_SITE_OTHER): Payer: BLUE CROSS/BLUE SHIELD

## 2015-07-28 ENCOUNTER — Ambulatory Visit (INDEPENDENT_AMBULATORY_CARE_PROVIDER_SITE_OTHER): Payer: BLUE CROSS/BLUE SHIELD | Admitting: Family Medicine

## 2015-07-28 VITALS — BP 153/108 | HR 66 | Temp 98.5°F | Resp 20 | Ht 67.0 in | Wt 261.0 lb

## 2015-07-28 DIAGNOSIS — J4521 Mild intermittent asthma with (acute) exacerbation: Secondary | ICD-10-CM | POA: Diagnosis not present

## 2015-07-28 DIAGNOSIS — J069 Acute upper respiratory infection, unspecified: Secondary | ICD-10-CM

## 2015-07-28 LAB — POCT CBC
Granulocyte percent: 68.7 %G (ref 37–80)
HEMATOCRIT: 49.6 % (ref 43.5–53.7)
HEMOGLOBIN: 16.7 g/dL (ref 14.1–18.1)
LYMPH, POC: 1.3 (ref 0.6–3.4)
MCH: 30.5 pg (ref 27–31.2)
MCHC: 33.8 g/dL (ref 31.8–35.4)
MCV: 90.2 fL (ref 80–97)
MID (CBC): 0.6 (ref 0–0.9)
MPV: 8.4 fL (ref 0–99.8)
POC GRANULOCYTE: 4.1 (ref 2–6.9)
POC LYMPH PERCENT: 22.1 %L (ref 10–50)
POC MID %: 9.2 % (ref 0–12)
Platelet Count, POC: 199 10*3/uL (ref 142–424)
RBC: 5.49 M/uL (ref 4.69–6.13)
RDW, POC: 14.3 %
WBC: 6 10*3/uL (ref 4.6–10.2)

## 2015-07-28 MED ORDER — LEVALBUTEROL HCL 0.63 MG/3ML IN NEBU
0.6300 mg | INHALATION_SOLUTION | Freq: Once | RESPIRATORY_TRACT | Status: AC
  Administered 2015-07-28: 0.63 mg via RESPIRATORY_TRACT

## 2015-07-28 MED ORDER — ALBUTEROL SULFATE HFA 108 (90 BASE) MCG/ACT IN AERS: 2.0000 | INHALATION_SPRAY | Freq: Four times a day (QID) | RESPIRATORY_TRACT | Status: DC | PRN

## 2015-07-28 MED ORDER — AZELASTINE HCL 0.1 % NA SOLN: 2.0000 | Freq: Two times a day (BID) | NASAL | Status: DC

## 2015-07-28 MED ORDER — PREDNISONE 20 MG PO TABS: ORAL_TABLET | ORAL | Status: DC

## 2015-07-28 NOTE — Progress Notes (Signed)
Subjective:    Patient ID: Donald Escobar, male    DOB: 14-Nov-1955, 60 y.o.   MRN: [id number]  07/28/2015  Follow-up   HPI This 60 y.o. male presents for evaluation of persistent illness.  Known CHF, asthma, CAD, atrial fibrillation, aortic insufficiency, history of pneumonia.  No improvement since last visit.  Treated 07/25/15 by Dr. Marin Comment; diagnosed URI; rx for Zpack, Tessalon perles provided.  Rapid strep and throat cultures negative.  INR 2/10 of 3.2 due to chronic coumadin therapy for atrial fibrillation.   +fever Tmax unsure; felt warm yesterday most of the morning.  No chills.  No body aches.  Bad headache moving around. No ear pain.  +ST; mild pain with swallowing.  +rhinorrhea; +nasal congestion.  Clear drainage.  +dry cough; +PND; no sputum production.  Wheezing badly again. Needs inhaler refilled.  Out of Albuterol; only exercise induced asthma.  Weight DOWN six pounds since last visit three days ago.  +SOB.  Cannot sleep due to wheezing. Usually breathes through nose; no v/d.  Taking Alkeseltzer and Mucinex liquid and pills.  Taking airborne.  PND causing a horrible cough.  Dr. Marin Comment offered a nasal spray but did not prescribe one.   Review of Systems  Constitutional: Negative for fever, chills, diaphoresis, activity change, appetite change and fatigue.  Respiratory: Negative for cough and shortness of breath.   Cardiovascular: Negative for chest pain, palpitations and leg swelling.  Gastrointestinal: Negative for nausea, vomiting, abdominal pain and diarrhea.  Endocrine: Negative for cold intolerance, heat intolerance, polydipsia, polyphagia and polyuria.  Skin: Negative for color change, rash and wound.  Neurological: Negative for dizziness, tremors, seizures, syncope, facial asymmetry, speech difficulty, weakness, light-headedness, numbness and headaches.  Psychiatric/Behavioral: Negative for sleep disturbance and dysphoric mood. The patient is not nervous/anxious.     Past Medical  History  Diagnosis Date  . Asthma   . Non Hodgkin's lymphoma ([place]) 2003    a.  tx with anthracycline.  . Hypertension   . Chronic systolic CHF (congestive heart failure) ([place])     a. 2D Echo 07/13/14 - EF 25-30%, severe diffuse HK with WMA< grade 2 DD, mild AS, mod-severe AI. b. TEE 07/15/14 - EF 40-45%, severe AI.  [name] Aortic insufficiency   . CAD (coronary artery disease)     a. Small NSTEMI 06/2014: cath 07/15/14: 90% prox LAD diagonal branch bifurcation disease.  . Palpitations     a. 06/2014: telemetry significant for brief irregular wide complex tachycardia (question AF/NSVT), and narrow complex irregular tachycardia with P wave activity (question PAT). Quiescent after BB initiation.  . Abnormal CT scan     a. 06/2014: Incidental findings on CTA which will need follow-up per primary care - 72mm R middle lobe lung nodule noted on CTA, heterogeneous adrenal adenoma noted at L adrenal gland, 27mm nonobstructing stone in upper pole of L kidney, scattered hepatic cysts.  . Hyperlipemia   . Morbid obesity ([place])   . Shortness of breath dyspnea     With exertion.  . Pneumonia 2003  . Personal history of kidney stones     current - no pain   Past Surgical History  Procedure Laterality Date  . Left and right heart catheterization with coronary angiogram N/A 07/15/2014    Procedure: LEFT AND RIGHT HEART CATHETERIZATION WITH CORONARY ANGIOGRAM;  Surgeon: [name], MD;  Location: [facility] CATH LAB;  Service: Cardiovascular;  Laterality: N/A;  . Tee without cardioversion N/A 07/15/2014    Procedure: TRANSESOPHAGEAL ECHOCARDIOGRAM (TEE);  Surgeon: Thayer Headings, MD;  Location: [facility];  Service: Cardiovascular;  Laterality: N/A;  . Coronary artery bypass graft N/A 07/24/2014    Procedure: CORONARY ARTERY BYPASS GRAFTING (CABG), ON PUMP, TIMES TWO, USING LEFT INTERNAL MAMMARY ARTERY, LEFT GREATER SAPHENOUS VEIN HARVESTED ENDOSCOPICALLY;  Surgeon: Melrose Nakayama, MD;  Location: [facility];  Service: Open  Heart Surgery;  Laterality: N/A;  . Aortic valve replacement N/A 07/24/2014    Procedure: AORTIC VALVE REPLACEMENT (AVR);  Surgeon: Melrose Nakayama, MD;  Location: [facility];  Service: Open Heart Surgery;  Laterality: N/A;  . Tee without cardioversion N/A 07/24/2014    Procedure: TRANSESOPHAGEAL ECHOCARDIOGRAM (TEE);  Surgeon: Melrose Nakayama, MD;  Location: [facility];  Service: Open Heart Surgery;  Laterality: N/A;   Allergies  Allergen Reactions  . Sulfa Antibiotics Swelling    Lip swelling    Social History   Social History  . Marital Status: Single    Spouse Name: N/A  . Number of Children: N/A  . Years of Education: N/A   Occupational History  . Not on file.   Social History Main Topics  . Smoking status: Never Smoker   . Smokeless tobacco: Never Used  . Alcohol Use: No  . Drug Use: No  . Sexual Activity: No   Other Topics Concern  . Not on file   Social History Narrative   Family History  Problem Relation Age of Onset  . Hypertension Mother        Objective:    BP 153/108 mmHg  Pulse 66  Temp(Src) 98.5 F (36.9 C) (Oral)  Resp 20  Ht 5\' 7"  (1.702 m)  Wt 261 lb (118.389 kg)  BMI 40.87 kg/m2  SpO2 97% Physical Exam  Constitutional: He is oriented to person, place, and time. He appears well-developed and well-nourished. No distress.  HENT:  Head: Normocephalic and atraumatic.  Right Ear: External ear normal.  Left Ear: External ear normal.  Nose: Nose normal.  Mouth/Throat: Oropharynx is clear and moist.  Eyes: Conjunctivae and EOM are normal. Pupils are equal, round, and reactive to light.  Neck: Normal range of motion. Neck supple. Carotid bruit is not present. No thyromegaly present.  Cardiovascular: Normal rate, regular rhythm, normal heart sounds and intact distal pulses.  Exam reveals no gallop and no friction rub.   No murmur heard. Pulmonary/Chest: Effort normal and breath sounds normal. He has no wheezes. He has no rales.  Abdominal: Soft.  Bowel sounds are normal. He exhibits no distension and no mass. There is no tenderness. There is no rebound and no guarding.  Lymphadenopathy:    He has no cervical adenopathy.  Neurological: He is alert and oriented to person, place, and time. No cranial nerve deficit.  Skin: Skin is warm and dry. No rash noted. He is not diaphoretic.  Psychiatric: He has a normal mood and affect. His behavior is normal.  Nursing note and vitals reviewed.  Results for orders placed or performed in visit on 07/28/15  POCT CBC  Result Value Ref Range   WBC 6.0 4.6 - 10.2 K/uL   Lymph, poc 1.3 0.6 - 3.4   POC LYMPH PERCENT 22.1 10 - 50 %L   MID (cbc) 0.6 0 - 0.9   POC MID % 9.2 0 - 12 %M   POC Granulocyte 4.1 2 - 6.9   Granulocyte percent 68.7 37 - 80 %G   RBC 5.49 4.69 - 6.13 M/uL   Hemoglobin 16.7 14.1 - 18.1 g/dL  HCT, POC 49.6 43.5 - 53.7 %   MCV 90.2 80 - 97 fL   MCH, POC 30.5 27 - 31.2 pg   MCHC 33.8 31.8 - 35.4 g/dL   RDW, POC 14.3 %   Platelet Count, POC 199 142 - 424 K/uL   MPV 8.4 0 - 99.8 fL       Assessment & Plan:   1. Acute upper respiratory infection   2. Asthma with acute exacerbation, mild intermittent     Orders Placed This Encounter  Procedures  . DG Chest 2 View    Standing Status: Future     Number of Occurrences: 1     Standing Expiration Date: 07/27/2016    Order Specific Question:  Reason for Exam (SYMPTOM  OR DIAGNOSIS REQUIRED)    Answer:  cough, wheezing; CAD/CABG, CHF    Order Specific Question:  Preferred imaging location?    Answer:  External  . POCT CBC   Meds ordered this encounter  Medications  . levalbuterol (XOPENEX) nebulizer solution 0.63 mg    Sig:   . albuterol (PROVENTIL HFA;VENTOLIN HFA) 108 (90 Base) MCG/ACT inhaler    Sig: Inhale 2 puffs into the lungs every 6 (six) hours as needed for wheezing or shortness of breath (cough, shortness of breath or wheezing.).    Dispense:  1 Inhaler    Refill:  1  . azelastine (ASTELIN) 0.1 % nasal spray     Sig: Place 2 sprays into both nostrils 2 (two) times daily. Use in each nostril as directed    Dispense:  30 mL    Refill:  12  . predniSONE (DELTASONE) 20 MG tablet    Sig: Three tablets daily x 2 days then two tablets daily x 5 days then one tablet daily x 5 days    Dispense:  21 tablet    Refill:  0    Return if symptoms worsen or fail to improve.    Rhonda Vangieson Elayne Guerin, M.D. [facility] [address] [place], [place]  [address] [phone] phone [phone] fax

## 2015-07-28 NOTE — Patient Instructions (Addendum)
Because you received an x-ray today, you will receive an invoice from [facility]. Please contact [facility] at [phone] with questions or concerns regarding your invoice. Our billing staff will not be able to assist you with those questions.  Asthma, Acute Bronchospasm Acute bronchospasm caused by asthma is also referred to as an asthma attack. Bronchospasm means your air passages become narrowed. The narrowing is caused by inflammation and tightening of the muscles in the air tubes (bronchi) in your lungs. This can make it hard to breathe or cause you to wheeze and cough. CAUSES Possible triggers are:  Animal dander from the skin, hair, or feathers of animals.  Dust mites contained in house dust.  Cockroaches.  Pollen from trees or grass.  Mold.  Cigarette or tobacco smoke.  Air pollutants such as dust, household cleaners, hair sprays, aerosol sprays, paint fumes, strong chemicals, or strong odors.  Cold air or weather changes. Cold air may trigger inflammation. Winds increase molds and pollens in the air.  Strong emotions such as crying or laughing hard.  Stress.  Certain medicines such as aspirin or beta-blockers.  Sulfites in foods and drinks, such as dried fruits and wine.  Infections or inflammatory conditions, such as a flu, cold, or inflammation of the nasal membranes (rhinitis).  Gastroesophageal reflux disease (GERD). GERD is a condition where stomach acid backs up into your esophagus.  Exercise or strenuous activity. SIGNS AND SYMPTOMS   Wheezing.  Excessive coughing, particularly at night.  Chest tightness.  Shortness of breath. DIAGNOSIS  Your health care provider will ask you about your medical history and perform a physical exam. A chest X-ray or blood testing may be performed to look for other causes of your symptoms or other conditions that may have triggered your asthma attack. TREATMENT  Treatment is aimed at reducing  inflammation and opening up the airways in your lungs. Most asthma attacks are treated with inhaled medicines. These include quick relief or rescue medicines (such as bronchodilators) and controller medicines (such as inhaled corticosteroids). These medicines are sometimes given through an inhaler or a nebulizer. Systemic steroid medicine taken by mouth or given through an IV tube also can be used to reduce the inflammation when an attack is moderate or severe. Antibiotic medicines are only used if a bacterial infection is present.  HOME CARE INSTRUCTIONS   Rest.  Drink plenty of liquids. This helps the mucus to remain thin and be easily coughed up. Only use caffeine in moderation and do not use alcohol until you have recovered from your illness.  Do not smoke. Avoid being exposed to secondhand smoke.  You play a critical role in keeping yourself in good health. Avoid exposure to things that cause you to wheeze or to have breathing problems.  Keep your medicines up-to-date and available. Carefully follow your health care provider's treatment plan.  Take your medicine exactly as prescribed.  When pollen or pollution is bad, keep windows closed and use an air conditioner or go to places with air conditioning.  Asthma requires careful medical care. See your health care provider for a follow-up as advised. If you are more than [redacted] weeks pregnant and you were prescribed any new medicines, let your obstetrician know about the visit and how you are doing. Follow up with your health care provider as directed.  After you have recovered from your asthma attack, make an appointment with your outpatient doctor to talk about ways to reduce the likelihood of future attacks. If you do  not have a doctor who manages your asthma, make an appointment with a primary care doctor to discuss your asthma. SEEK IMMEDIATE MEDICAL CARE IF:   You are getting worse.  You have trouble breathing. If severe, call your local  emergency services (911 in the [place]  You develop chest pain or discomfort.  You are vomiting.  You are not able to keep fluids down.  You are coughing up yellow, green, brown, or bloody sputum.  You have a fever and your symptoms suddenly get worse.  You have trouble swallowing. MAKE SURE YOU:   Understand these instructions.  Will watch your condition.  Will get help right away if you are not doing well or get worse.   This information is not intended to replace advice given to you by your health care provider. Make sure you discuss any questions you have with your health care provider.   Document Released: 09/15/2006 Document Revised: 06/05/2013 Document Reviewed: 12/06/2012 Elsevier Interactive Patient Education [employer].

## 2015-08-18 ENCOUNTER — Other Ambulatory Visit: Payer: Self-pay

## 2015-08-18 ENCOUNTER — Ambulatory Visit (INDEPENDENT_AMBULATORY_CARE_PROVIDER_SITE_OTHER): Payer: BLUE CROSS/BLUE SHIELD | Admitting: Pharmacist Clinician (PhC)/ Clinical Pharmacy Specialist

## 2015-08-18 DIAGNOSIS — I4891 Unspecified atrial fibrillation: Secondary | ICD-10-CM | POA: Diagnosis not present

## 2015-08-18 DIAGNOSIS — I480 Paroxysmal atrial fibrillation: Secondary | ICD-10-CM | POA: Diagnosis not present

## 2015-08-18 DIAGNOSIS — Z954 Presence of other heart-valve replacement: Secondary | ICD-10-CM | POA: Diagnosis not present

## 2015-08-18 DIAGNOSIS — Z952 Presence of prosthetic heart valve: Secondary | ICD-10-CM

## 2015-08-18 DIAGNOSIS — Z7901 Long term (current) use of anticoagulants: Secondary | ICD-10-CM

## 2015-08-18 DIAGNOSIS — I25810 Atherosclerosis of coronary artery bypass graft(s) without angina pectoris: Secondary | ICD-10-CM

## 2015-08-18 DIAGNOSIS — Z951 Presence of aortocoronary bypass graft: Secondary | ICD-10-CM

## 2015-08-18 DIAGNOSIS — I214 Non-ST elevation (NSTEMI) myocardial infarction: Secondary | ICD-10-CM

## 2015-08-18 LAB — POCT INR: INR: 2.1

## 2015-08-18 MED ORDER — AMIODARONE HCL 200 MG PO TABS: 100.0000 mg | ORAL_TABLET | Freq: Every day | ORAL | Status: DC

## 2015-08-18 NOTE — Telephone Encounter (Signed)
Rx(s) sent to pharmacy electronically.  

## 2015-09-08 ENCOUNTER — Ambulatory Visit (INDEPENDENT_AMBULATORY_CARE_PROVIDER_SITE_OTHER): Payer: BLUE CROSS/BLUE SHIELD | Admitting: Pharmacist Clinician (PhC)/ Clinical Pharmacy Specialist

## 2015-09-08 DIAGNOSIS — Z954 Presence of other heart-valve replacement: Secondary | ICD-10-CM | POA: Diagnosis not present

## 2015-09-08 DIAGNOSIS — Z7901 Long term (current) use of anticoagulants: Secondary | ICD-10-CM | POA: Diagnosis not present

## 2015-09-08 DIAGNOSIS — Z952 Presence of prosthetic heart valve: Secondary | ICD-10-CM

## 2015-09-08 DIAGNOSIS — I480 Paroxysmal atrial fibrillation: Secondary | ICD-10-CM

## 2015-09-08 DIAGNOSIS — I4891 Unspecified atrial fibrillation: Secondary | ICD-10-CM

## 2015-09-08 LAB — POCT INR: INR: 1.1

## 2015-09-18 IMAGING — CR DG CHEST 2V
2 series · 2 of 2 positions shown · non-contrast
Comparison: 09/16/2014

CLINICAL DATA: Chronic systolic congestive heart failure. Coronary
artery disease. Four months postop from CABG.

EXAM:
CHEST  2 VIEW

[view not recorded (1 of 2)]
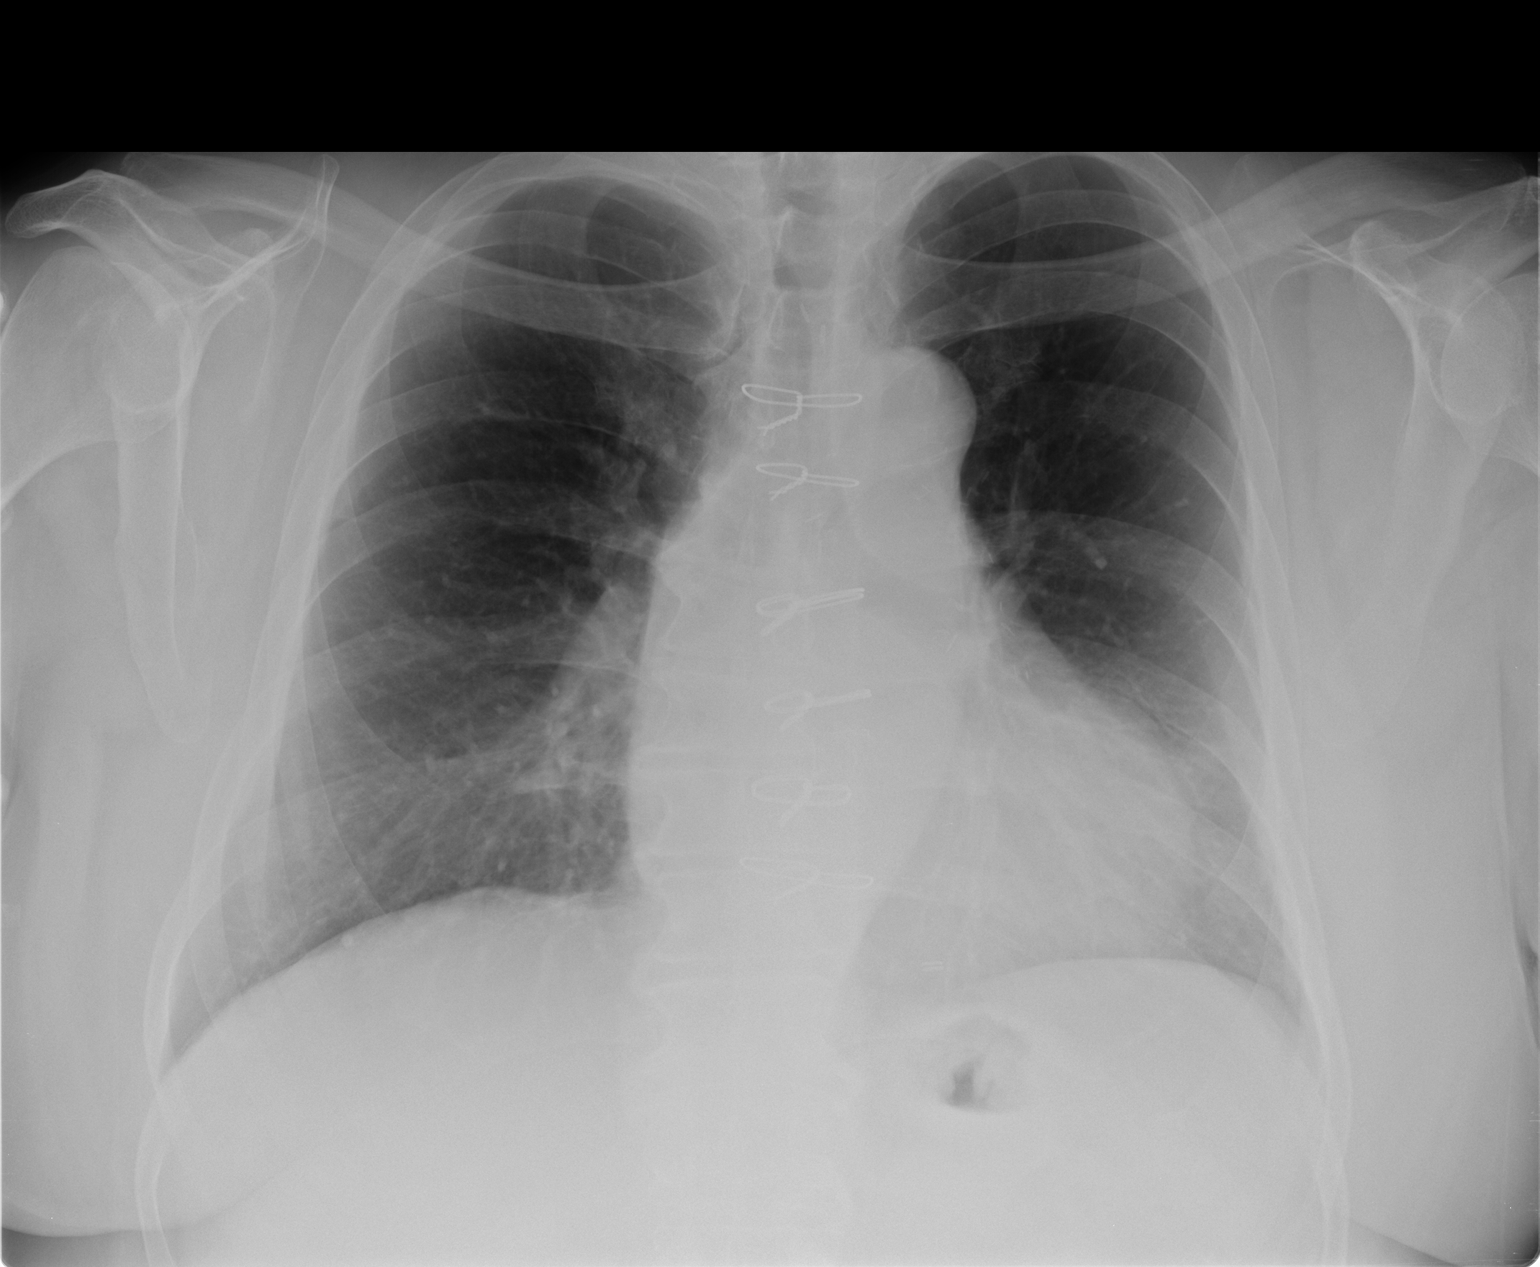

[view not recorded (2 of 2)]
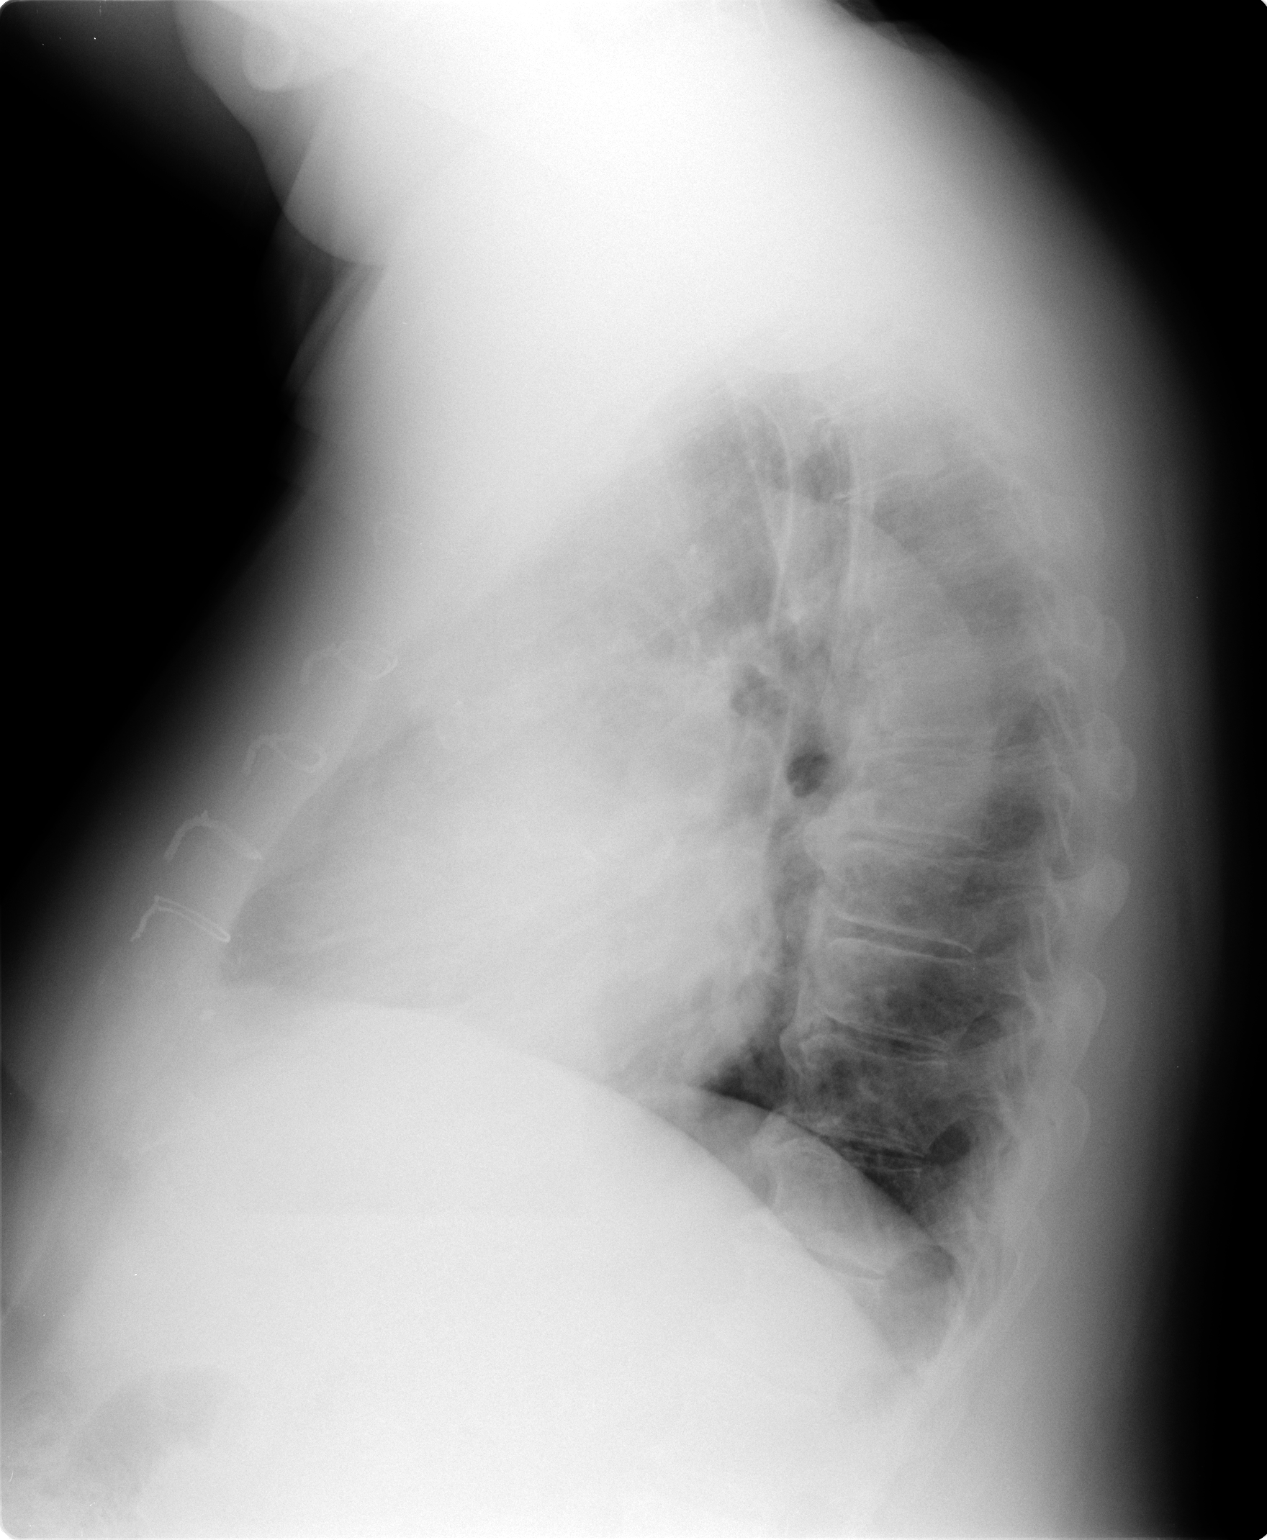

[2 of 2 positions shown; findings below may reference images not displayed]

FINDINGS: Mild cardiomegaly remains stable. Both lungs are clear. No evidence
of pleural effusion or pneumothorax. No evidence of pulmonary
infiltrate or edema. Ectasia of the thoracic aorta is stable. Prior
CABG again noted.
IMPRESSION: Stable mild cardiomegaly.  No active lung disease.

## 2015-09-22 ENCOUNTER — Ambulatory Visit (INDEPENDENT_AMBULATORY_CARE_PROVIDER_SITE_OTHER): Payer: BLUE CROSS/BLUE SHIELD | Admitting: Pharmacist Clinician (PhC)/ Clinical Pharmacy Specialist

## 2015-09-22 ENCOUNTER — Other Ambulatory Visit: Payer: Self-pay | Admitting: Pharmacist Clinician (PhC)/ Clinical Pharmacy Specialist

## 2015-09-22 DIAGNOSIS — I4891 Unspecified atrial fibrillation: Secondary | ICD-10-CM

## 2015-09-22 DIAGNOSIS — I480 Paroxysmal atrial fibrillation: Secondary | ICD-10-CM | POA: Diagnosis not present

## 2015-09-22 DIAGNOSIS — Z954 Presence of other heart-valve replacement: Secondary | ICD-10-CM | POA: Diagnosis not present

## 2015-09-22 DIAGNOSIS — Z7901 Long term (current) use of anticoagulants: Secondary | ICD-10-CM | POA: Diagnosis not present

## 2015-09-22 DIAGNOSIS — Z952 Presence of prosthetic heart valve: Secondary | ICD-10-CM

## 2015-09-22 LAB — POCT INR: INR: 2.7

## 2015-09-22 MED ORDER — FUROSEMIDE 40 MG PO TABS: 40.0000 mg | ORAL_TABLET | Freq: Every day | ORAL | Status: DC

## 2015-10-15 ENCOUNTER — Ambulatory Visit (INDEPENDENT_AMBULATORY_CARE_PROVIDER_SITE_OTHER): Payer: BLUE CROSS/BLUE SHIELD | Admitting: Pharmacist

## 2015-10-15 ENCOUNTER — Encounter: Payer: BLUE CROSS/BLUE SHIELD | Admitting: Pharmacist Clinician (PhC)/ Clinical Pharmacy Specialist

## 2015-10-15 DIAGNOSIS — I4891 Unspecified atrial fibrillation: Secondary | ICD-10-CM | POA: Diagnosis not present

## 2015-10-15 DIAGNOSIS — Z7901 Long term (current) use of anticoagulants: Secondary | ICD-10-CM

## 2015-10-15 DIAGNOSIS — Z954 Presence of other heart-valve replacement: Secondary | ICD-10-CM | POA: Diagnosis not present

## 2015-10-15 DIAGNOSIS — I480 Paroxysmal atrial fibrillation: Secondary | ICD-10-CM | POA: Diagnosis not present

## 2015-10-15 DIAGNOSIS — Z952 Presence of prosthetic heart valve: Secondary | ICD-10-CM

## 2015-10-15 LAB — POCT INR: INR: 2.3

## 2015-10-21 ENCOUNTER — Ambulatory Visit: Payer: BLUE CROSS/BLUE SHIELD | Admitting: Cardiovascular Disease

## 2015-10-23 ENCOUNTER — Telehealth: Payer: Self-pay

## 2015-10-23 ENCOUNTER — Other Ambulatory Visit: Payer: Self-pay | Admitting: Cardiovascular Disease

## 2015-10-23 NOTE — Telephone Encounter (Signed)
Rx(s) sent to pharmacy electronically.  

## 2015-10-23 NOTE — Telephone Encounter (Signed)
patient called in because he misplaced his coreg. I explained he would need to call his pharmacy to see if he can get a refill since he has remaining refills from [place] and explained he may have to pay out of pocket. He stated his understanding.

## 2015-10-29 ENCOUNTER — Ambulatory Visit (INDEPENDENT_AMBULATORY_CARE_PROVIDER_SITE_OTHER): Payer: BLUE CROSS/BLUE SHIELD | Admitting: Cardiovascular Disease

## 2015-10-29 ENCOUNTER — Encounter: Payer: Self-pay | Admitting: Cardiovascular Disease

## 2015-10-29 VITALS — BP 124/96 | HR 60 | Ht 66.0 in | Wt 261.5 lb

## 2015-10-29 DIAGNOSIS — E785 Hyperlipidemia, unspecified: Secondary | ICD-10-CM | POA: Diagnosis not present

## 2015-10-29 DIAGNOSIS — Z79899 Other long term (current) drug therapy: Secondary | ICD-10-CM

## 2015-10-29 DIAGNOSIS — I10 Essential (primary) hypertension: Secondary | ICD-10-CM | POA: Diagnosis not present

## 2015-10-29 DIAGNOSIS — Z954 Presence of other heart-valve replacement: Secondary | ICD-10-CM | POA: Diagnosis not present

## 2015-10-29 DIAGNOSIS — I480 Paroxysmal atrial fibrillation: Secondary | ICD-10-CM

## 2015-10-29 DIAGNOSIS — Z952 Presence of prosthetic heart valve: Secondary | ICD-10-CM

## 2015-10-29 DIAGNOSIS — Z951 Presence of aortocoronary bypass graft: Secondary | ICD-10-CM

## 2015-10-29 LAB — HEPATIC FUNCTION PANEL
ALT: 23 U/L (ref 9–46)
AST: 27 U/L (ref 10–35)
Albumin: 4.4 g/dL (ref 3.6–5.1)
Alkaline Phosphatase: 78 U/L (ref 40–115)
BILIRUBIN TOTAL: 0.6 mg/dL (ref 0.2–1.2)
Bilirubin, Direct: 0.1 mg/dL (ref <=0.2)
Indirect Bilirubin: 0.5 mg/dL (ref 0.2–1.2)
TOTAL PROTEIN: 7.2 g/dL (ref 6.1–8.1)

## 2015-10-29 LAB — LIPID PANEL
CHOL/HDL RATIO: 5.4 ratio — AB (ref <=5.0)
CHOLESTEROL: 195 mg/dL (ref 125–200)
HDL: 36 mg/dL — ABNORMAL LOW (ref >=40)
LDL Cholesterol: 137 mg/dL — ABNORMAL HIGH (ref <130)
Triglycerides: 110 mg/dL (ref <150)
VLDL: 22 mg/dL (ref <30)

## 2015-10-29 NOTE — Assessment & Plan Note (Signed)
History of severe aortic insufficiency with LV dysfunction and LV dilatation status post bovine aortic valve replacemen tFebruary 2016. He has not had a postop 2-D echo.

## 2015-10-29 NOTE — Progress Notes (Signed)
10/29/2015 Donald Escobar   12/25/1955  [id number]  Primary Physician No PCP Per Patient Primary Cardiologist: [name] MD Renae Gloss   HPI:  Donald Escobar is a 60 year old mildly overweight single Caucasian male with no children who works in the [employer]. He was admitted with congestive heart failure on 11/01/14.[name] He ruled out for myocardial infarction. A 2-D echocardiogram revealed moderately severe LV dysfunction with a globally dilated LV and EF of 25%. He also had moderate to severe aortic insufficiency. He had packed his mitral fibrillation. He underwent right and left heart cardiac catheterization by myself on 07/15/14 revealing LAD diagonal branch disease, moderate LV dysfunction with moderate to severe aortic insufficiency. 10 days later he underwent coronary artery bypass grafting with a LIMA to his LAD, vein to diagonal branch and a bovine pericardial aortic valve replacement. He was discharged home on amiodarone, Coumadin anticoagulation. He saw [name] registered nurse practitioner in the office on 09/09/14 which time he was in sinus rhythm. He never participated in cardiac rehabilitation. He feels clinically improved. He currently does not have a primary care physician.   Current Outpatient Prescriptions  Medication Sig Dispense Refill  . albuterol (PROVENTIL HFA;VENTOLIN HFA) 108 (90 Base) MCG/ACT inhaler Inhale 2 puffs into the lungs every 6 (six) hours as needed for wheezing or shortness of breath (cough, shortness of breath or wheezing.). 1 Inhaler 1  . amiodarone (PACERONE) 200 MG tablet Take 0.5 tablets (100 mg total) by mouth daily. 15 tablet 2  . aspirin EC 81 MG EC tablet Take 1 tablet (81 mg total) by mouth daily. 30 tablet 6  . azelastine (ASTELIN) 0.1 % nasal spray Place 2 sprays into both nostrils 2 (two) times daily. Use in each nostril as directed 30 mL 12  . carvedilol (COREG) 12.5 MG tablet TAKE 1 TABLET BY MOUTH TWICE A DAY 180 tablet 1    . cholecalciferol (VITAMIN D) 1000 UNITS tablet Take 1,000 Units by mouth daily.    . furosemide (LASIX) 40 MG tablet Take 1 tablet (40 mg total) by mouth daily. 30 tablet 1  . Potassium Chloride Crys CR (KLOR-CON M20 PO) Take 1 Can by mouth daily.    [name] warfarin (COUMADIN) 7.5 MG tablet Take 1 to 1.5 tablets by mouth daily as directed by coumadin clinic 40 tablet 2   No current facility-administered medications for this visit.    Allergies  Allergen Reactions  . Sulfa Antibiotics Swelling    Lip swelling    Social History   Social History  . Marital Status: Single    Spouse Name: N/A  . Number of Children: N/A  . Years of Education: N/A   Occupational History  . Not on file.   Social History Main Topics  . Smoking status: Never Smoker   . Smokeless tobacco: Never Used  . Alcohol Use: No  . Drug Use: No  . Sexual Activity: No   Other Topics Concern  . Not on file   Social History Narrative     Review of Systems: General: negative for chills, fever, night sweats or weight changes.  Cardiovascular: negative for chest pain, dyspnea on exertion, edema, orthopnea, palpitations, paroxysmal nocturnal dyspnea or shortness of breath Dermatological: negative for rash Respiratory: negative for cough or wheezing Urologic: negative for hematuria Abdominal: negative for nausea, vomiting, diarrhea, bright red blood per rectum, melena, or hematemesis Neurologic: negative for visual changes, syncope, or dizziness All other systems reviewed and are otherwise negative except  as noted above.    Blood pressure 124/96, pulse 60, height 5\' 6"  (1.676 m), weight 261 lb 8 oz (118.616 kg).  General appearance: alert and no distress Neck: no adenopathy, no carotid bruit, no JVD, supple, symmetrical, trachea midline and thyroid not enlarged, symmetric, no tenderness/mass/nodules Lungs: clear to auscultation bilaterally Heart: soft outflow tract murmur consistent with aortic  stenosis Extremities: extremities normal, atraumatic, no cyanosis or edema  EKG normal sinus rhythm at 63 nonspecific IVCDand repolarization abnormalities. I personally reviewed this EKG  ASSESSMENT AND PLAN:   Aortic insufficiency History of severe aortic insufficiency with LV dysfunction and LV dilatation status post bovine aortic valve replacemen tFebruary 2016. He has not had a postop 2-D echo.  Hyperlipemia History of hyperlipidemia intolerant to statin drugs. We will recheck a lipid and liver profile  S/P CABG x 2 History of CAD with cath performed /1/16 revealing LAD and diagonal branch disease. He is status post coronary artery bypass grafting with a LIMA to his LAD and a vein to the diagonal branch. He denies chest pain.  Atrial fibrillation [I48.91] History of atrial fibrillation in the past maintaining sinus rhythm on low-dose amiodarone and Coumadin anticoagulation.  Hypertension History of hypertension blood pressure measured at 124/96.He has not taken his blood pressure medicines today. He is on carvedilol. Continue current meds at current dosing      [name] MD [facility], [facility] 10/29/2015 12:15 PM

## 2015-10-29 NOTE — Assessment & Plan Note (Signed)
History of hyperlipidemia intolerant to statin drugs. We will recheck a lipid and liver profile

## 2015-10-29 NOTE — Assessment & Plan Note (Signed)
History of CAD with cath performed /1/16 revealing LAD and diagonal branch disease. He is status post coronary artery bypass grafting with a LIMA to his LAD and a vein to the diagonal branch. He denies chest pain.

## 2015-10-29 NOTE — Patient Instructions (Signed)
Medication Instructions:  Your physician recommends that you continue on your current medications as directed. Please refer to the Current Medication list given to you today.   Labwork: Your physician recommends that you return for lab work AT [facility]. The lab can be found on the FIRST FLOOR of out building in [address]   Testing/Procedures: Your physician has requested that you have an echocardiogram. Echocardiography is a painless test that uses sound waves to create images of your heart. It provides your doctor with information about the size and shape of your heart and how well your heart's chambers and valves are working. This procedure takes approximately one hour. There are no restrictions for this procedure. NOW AND IN 12 MONTHS PRIOR TO YOUR 12 MONTH APPOINTMENT.    Follow-Up: Your physician wants you to follow-up in: [place]. PLEASE BEFORE SURE TO ASK TO HAVE AN ECHO ORDERED BEFORE THAT APPOINTMENT. You will receive a reminder letter in the mail two months in advance. If you don't receive a letter, please call our office to schedule the follow-up appointment.   Any Other Special Instructions Will Be Listed Below (If Applicable).     If you need a refill on your cardiac medications before your next appointment, please call your pharmacy.

## 2015-10-29 NOTE — Assessment & Plan Note (Signed)
History of atrial fibrillation in the past maintaining sinus rhythm on low-dose amiodarone and Coumadin anticoagulation.

## 2015-10-29 NOTE — Assessment & Plan Note (Signed)
History of hypertension blood pressure measured at 124/96.He has not taken his blood pressure medicines today. He is on carvedilol. Continue current meds at current dosing

## 2015-11-03 ENCOUNTER — Other Ambulatory Visit: Payer: Self-pay | Admitting: Cardiovascular Disease

## 2015-11-12 ENCOUNTER — Ambulatory Visit (INDEPENDENT_AMBULATORY_CARE_PROVIDER_SITE_OTHER): Payer: BLUE CROSS/BLUE SHIELD | Admitting: Pharmacist

## 2015-11-12 DIAGNOSIS — I480 Paroxysmal atrial fibrillation: Secondary | ICD-10-CM | POA: Diagnosis not present

## 2015-11-12 DIAGNOSIS — Z7901 Long term (current) use of anticoagulants: Secondary | ICD-10-CM | POA: Diagnosis not present

## 2015-11-12 DIAGNOSIS — Z954 Presence of other heart-valve replacement: Secondary | ICD-10-CM | POA: Diagnosis not present

## 2015-11-12 DIAGNOSIS — I4891 Unspecified atrial fibrillation: Secondary | ICD-10-CM | POA: Diagnosis not present

## 2015-11-12 DIAGNOSIS — Z952 Presence of prosthetic heart valve: Secondary | ICD-10-CM

## 2015-11-12 LAB — POCT INR: INR: 1

## 2015-11-13 ENCOUNTER — Other Ambulatory Visit: Payer: Self-pay

## 2015-11-13 ENCOUNTER — Ambulatory Visit (HOSPITAL_COMMUNITY): Payer: BLUE CROSS/BLUE SHIELD | Attending: Cardiology

## 2015-11-13 DIAGNOSIS — E785 Hyperlipidemia, unspecified: Secondary | ICD-10-CM | POA: Insufficient documentation

## 2015-11-13 DIAGNOSIS — I359 Nonrheumatic aortic valve disorder, unspecified: Secondary | ICD-10-CM | POA: Diagnosis present

## 2015-11-13 DIAGNOSIS — Z953 Presence of xenogenic heart valve: Secondary | ICD-10-CM | POA: Diagnosis not present

## 2015-11-13 DIAGNOSIS — Z951 Presence of aortocoronary bypass graft: Secondary | ICD-10-CM | POA: Insufficient documentation

## 2015-11-13 DIAGNOSIS — I2510 Atherosclerotic heart disease of native coronary artery without angina pectoris: Secondary | ICD-10-CM | POA: Insufficient documentation

## 2015-11-13 DIAGNOSIS — I77810 Thoracic aortic ectasia: Secondary | ICD-10-CM | POA: Insufficient documentation

## 2015-11-13 DIAGNOSIS — I509 Heart failure, unspecified: Secondary | ICD-10-CM | POA: Insufficient documentation

## 2015-11-13 DIAGNOSIS — I059 Rheumatic mitral valve disease, unspecified: Secondary | ICD-10-CM | POA: Diagnosis not present

## 2015-11-13 DIAGNOSIS — I10 Essential (primary) hypertension: Secondary | ICD-10-CM

## 2015-11-13 DIAGNOSIS — Z952 Presence of prosthetic heart valve: Secondary | ICD-10-CM

## 2015-11-13 DIAGNOSIS — I110 Hypertensive heart disease with heart failure: Secondary | ICD-10-CM | POA: Insufficient documentation

## 2015-11-13 DIAGNOSIS — Z954 Presence of other heart-valve replacement: Secondary | ICD-10-CM

## 2015-11-13 MED ORDER — PERFLUTREN LIPID MICROSPHERE
1.0000 mL | INTRAVENOUS | Status: AC | PRN
  Administered 2015-11-13: 2 mL via INTRAVENOUS

## 2015-11-26 ENCOUNTER — Ambulatory Visit (INDEPENDENT_AMBULATORY_CARE_PROVIDER_SITE_OTHER): Payer: BLUE CROSS/BLUE SHIELD | Admitting: Pharmacist

## 2015-11-26 DIAGNOSIS — I480 Paroxysmal atrial fibrillation: Secondary | ICD-10-CM | POA: Diagnosis not present

## 2015-11-26 DIAGNOSIS — Z954 Presence of other heart-valve replacement: Secondary | ICD-10-CM | POA: Diagnosis not present

## 2015-11-26 DIAGNOSIS — I4891 Unspecified atrial fibrillation: Secondary | ICD-10-CM

## 2015-11-26 DIAGNOSIS — Z952 Presence of prosthetic heart valve: Secondary | ICD-10-CM

## 2015-11-26 DIAGNOSIS — Z7901 Long term (current) use of anticoagulants: Secondary | ICD-10-CM | POA: Diagnosis not present

## 2015-11-26 LAB — POCT INR: INR: 1.5

## 2015-12-01 ENCOUNTER — Other Ambulatory Visit: Payer: Self-pay | Admitting: Cardiology

## 2015-12-11 ENCOUNTER — Ambulatory Visit (INDEPENDENT_AMBULATORY_CARE_PROVIDER_SITE_OTHER): Payer: BLUE CROSS/BLUE SHIELD | Admitting: Pharmacist Clinician (PhC)/ Clinical Pharmacy Specialist

## 2015-12-11 ENCOUNTER — Encounter: Payer: Self-pay | Admitting: Pharmacist Clinician (PhC)/ Clinical Pharmacy Specialist

## 2015-12-11 VITALS — Ht 66.0 in | Wt 260.4 lb

## 2015-12-11 DIAGNOSIS — Z954 Presence of other heart-valve replacement: Secondary | ICD-10-CM

## 2015-12-11 DIAGNOSIS — Z952 Presence of prosthetic heart valve: Secondary | ICD-10-CM

## 2015-12-11 DIAGNOSIS — I480 Paroxysmal atrial fibrillation: Secondary | ICD-10-CM | POA: Diagnosis not present

## 2015-12-11 DIAGNOSIS — E785 Hyperlipidemia, unspecified: Secondary | ICD-10-CM

## 2015-12-11 DIAGNOSIS — I4891 Unspecified atrial fibrillation: Secondary | ICD-10-CM | POA: Diagnosis not present

## 2015-12-11 DIAGNOSIS — Z7901 Long term (current) use of anticoagulants: Secondary | ICD-10-CM | POA: Diagnosis not present

## 2015-12-11 LAB — POCT INR: INR: 3.2

## 2015-12-11 NOTE — Assessment & Plan Note (Signed)
Will start on rosuvastatin 5 mg twice weekly.  He is to continue this for 1 month then increase to 3 times weekly if not having any myalgias.  After 8 weeks at the highest attainable dose will repeat lipid labs.  Did discuss option of PCSK-9 but will need to show failure of 2 statin drugs before considering this option.

## 2015-12-11 NOTE — Patient Instructions (Signed)
Start rosuvastatin 5 mg twice weekly.  If after 1 month you are feeling fine, please increase to three times weekly.   If you develop muscle weakness please give [place] a call [phone])  Cholesterol Cholesterol is a fat. Your body needs a small amount of cholesterol. Cholesterol may build up in your blood vessels. This increases your chance of having a heart attack or stroke. You cannot feel your cholesterol levels. The only way to know your cholesterol level is high is with a blood test. Keep your test results. Work with your doctor to keep your cholesterol at a good level. WHAT DO THE TEST RESULTS MEAN?  Total cholesterol is how much cholesterol is in your blood.  LDL is bad cholesterol. This is the type that can build up. You want LDL to be low.  HDL is good cholesterol. It cleans your blood vessels and carries LDL away. You want HDL to be high.  Triglycerides are fat that the body can burn for energy or store. WHAT ARE GOOD LEVELS OF CHOLESTEROL?  Total cholesterol below 200.  LDL below 100 for people at risk. Below 70 for those at very high risk.  HDL above 50 is good. Above 60 is best.  Triglycerides below 150. HOW CAN I LOWER MY CHOLESTEROL?  Diet. Follow your diet programs as told by your doctor.  Choose fish, white meat chicken, roasted [place], or baked [place]. Try not to eat red meat, fried foods, or processed meats such as sausage and lunch meats.  Eat lots of fresh fruits and vegetables.  Choose whole grains, beans, pasta, potatoes, and cereals.  Use only small amounts of olive, corn, or canola oils.  Try not to eat butter, mayonnaise, shortening, or palm kernel oils.  Try not to eat foods with trans fats.  Drink skim or nonfat milk. Eat low-fat or nonfat yogurt and cheeses. Try not to drink whole milk or cream. Try not to eat ice cream, egg yolks, and full-fat cheeses.  Healthy desserts include angel food cake, ginger snaps, animal crackers, hard candy,  popsicles, and low-fat or nonfat frozen yogurt. Try not to eat pastries, cakes, pies, and cookies.  Exercise. Follow your exercise programs as told by your doctor.  Be more active. You can try gardening, walking, or taking the stairs. Ask your doctor about how you can be more active.  Medicine. Take medicine as told by your doctor.   This information is not intended to replace advice given to you by your health care provider. Make sure you discuss any questions you have with your health care provider.   Document Released: 08/27/2008 Document Revised: 06/21/2014 Document Reviewed: 03/14/2013 Elsevier Interactive Patient Education [employer].

## 2015-12-11 NOTE — Assessment & Plan Note (Deleted)
Will start on rosuvastatin

## 2015-12-11 NOTE — Progress Notes (Signed)
12/11/2015 Donald Escobar July 11, 1955 [id number]   HPI:  Donald Escobar is a 60 y.o. male patient of Dr Gwenlyn Found who presents today for a lipid clinic evaluation.  He has previously tried atorvastatin 40 mg, which caused him to have muscle weakness and pain.  He has not tried any other statin.  He continues to work full time and is on his feet for much of the day.    Current Medications: none  Risk Factors: CAD s/p CABG x 2, AF, HTN  Cholesterol Goals: LDL <70   Intolerant/previously tried: atorvastatin 40 mg (myalgia/weakness in legs) (Dec 2016-Feb 2017)  Family history:  Nothing known; does not know father's history; no siblings or children  Diet: eats mostly at home, admits to regular fried foods; has no dietary limits or restrictions.  Exercise:  none  Labs:  10/2015 - TC 195, TG 110, HDL 36, LDL 137 (no meds)  Current Outpatient Prescriptions  Medication Sig Dispense Refill  . albuterol (PROVENTIL HFA;VENTOLIN HFA) 108 (90 Base) MCG/ACT inhaler Inhale 2 puffs into the lungs every 6 (six) hours as needed for wheezing or shortness of breath (cough, shortness of breath or wheezing.). 1 Inhaler 1  . amiodarone (PACERONE) 200 MG tablet Take 0.5 tablets (100 mg total) by mouth daily. 15 tablet 2  . amiodarone (PACERONE) 200 MG tablet TAKE 1 TABLET BY MOUTH EVERY DAY 30 tablet 11  . aspirin EC 81 MG EC tablet Take 1 tablet (81 mg total) by mouth daily. 30 tablet 6  . azelastine (ASTELIN) 0.1 % nasal spray Place 2 sprays into both nostrils 2 (two) times daily. Use in each nostril as directed 30 mL 12  . carvedilol (COREG) 12.5 MG tablet TAKE 1 TABLET BY MOUTH TWICE A DAY 180 tablet 1  . cholecalciferol (VITAMIN D) 1000 UNITS tablet Take 1,000 Units by mouth daily.    . furosemide (LASIX) 40 MG tablet Take 1 tablet (40 mg total) by mouth daily. 30 tablet 1  . Potassium Chloride Crys CR (KLOR-CON M20 PO) Take 1 Can by mouth daily.    [name] warfarin (COUMADIN) 7.5 MG tablet TAKE 1 TO 1.5 TABLETS BY  MOUTH DAILY AS DIRECTED BY COUMADIN CLINIC 40 tablet 2   No current facility-administered medications for this visit.    Allergies  Allergen Reactions  . Sulfa Antibiotics Swelling    Lip swelling    Past Medical History  Diagnosis Date  . Asthma   . Non Hodgkin's lymphoma ([place]) 2003    a.  tx with anthracycline.  . Hypertension   . Chronic systolic CHF (congestive heart failure) ([place])     a. 2D Echo 07/13/14 - EF 25-30%, severe diffuse HK with WMA< grade 2 DD, mild AS, mod-severe AI. b. TEE 07/15/14 - EF 40-45%, severe AI.  [name] Aortic insufficiency   . CAD (coronary artery disease)     a. Small NSTEMI 06/2014: cath 07/15/14: 90% prox LAD diagonal branch bifurcation disease.  . Palpitations     a. 06/2014: telemetry significant for brief irregular wide complex tachycardia (question AF/NSVT), and narrow complex irregular tachycardia with P wave activity (question PAT). Quiescent after BB initiation.  . Abnormal CT scan     a. 06/2014: Incidental findings on CTA which will need follow-up per primary care - 17mm R middle lobe lung nodule noted on CTA, heterogeneous adrenal adenoma noted at L adrenal gland, 77mm nonobstructing stone in upper pole of L kidney, scattered hepatic cysts.  . Hyperlipemia   . Morbid  obesity ([place])   . Shortness of breath dyspnea     With exertion.  . Pneumonia 2003  . Personal history of kidney stones     current - no pain     Weight 260.4 lb Height 5'6"   Donald Escobar PharmD CPP [place]

## 2015-12-19 ENCOUNTER — Other Ambulatory Visit: Payer: Self-pay | Admitting: Pharmacist Clinician (PhC)/ Clinical Pharmacy Specialist

## 2015-12-19 MED ORDER — ROSUVASTATIN CALCIUM 5 MG PO TABS: ORAL_TABLET | ORAL | Status: DC

## 2015-12-22 ENCOUNTER — Other Ambulatory Visit: Payer: Self-pay | Admitting: *Deleted

## 2015-12-22 MED ORDER — FUROSEMIDE 40 MG PO TABS: 40.0000 mg | ORAL_TABLET | Freq: Every day | ORAL | Status: DC

## 2016-01-08 ENCOUNTER — Ambulatory Visit (INDEPENDENT_AMBULATORY_CARE_PROVIDER_SITE_OTHER): Payer: BLUE CROSS/BLUE SHIELD | Admitting: Pharmacist

## 2016-01-08 DIAGNOSIS — Z954 Presence of other heart-valve replacement: Secondary | ICD-10-CM | POA: Diagnosis not present

## 2016-01-08 DIAGNOSIS — Z952 Presence of prosthetic heart valve: Secondary | ICD-10-CM

## 2016-01-08 DIAGNOSIS — I480 Paroxysmal atrial fibrillation: Secondary | ICD-10-CM | POA: Diagnosis not present

## 2016-01-08 DIAGNOSIS — Z7901 Long term (current) use of anticoagulants: Secondary | ICD-10-CM

## 2016-01-08 DIAGNOSIS — I4891 Unspecified atrial fibrillation: Secondary | ICD-10-CM

## 2016-01-08 LAB — POCT INR: INR: 4.3

## 2016-01-22 ENCOUNTER — Ambulatory Visit (INDEPENDENT_AMBULATORY_CARE_PROVIDER_SITE_OTHER): Payer: BLUE CROSS/BLUE SHIELD | Admitting: Pharmacist

## 2016-01-22 ENCOUNTER — Encounter (INDEPENDENT_AMBULATORY_CARE_PROVIDER_SITE_OTHER): Payer: Self-pay

## 2016-01-22 DIAGNOSIS — Z7901 Long term (current) use of anticoagulants: Secondary | ICD-10-CM | POA: Diagnosis not present

## 2016-01-22 DIAGNOSIS — I4891 Unspecified atrial fibrillation: Secondary | ICD-10-CM

## 2016-01-22 DIAGNOSIS — Z954 Presence of other heart-valve replacement: Secondary | ICD-10-CM | POA: Diagnosis not present

## 2016-01-22 DIAGNOSIS — Z952 Presence of prosthetic heart valve: Secondary | ICD-10-CM

## 2016-01-22 DIAGNOSIS — I480 Paroxysmal atrial fibrillation: Secondary | ICD-10-CM

## 2016-01-22 LAB — POCT INR: INR: 1.9

## 2016-02-12 ENCOUNTER — Ambulatory Visit (INDEPENDENT_AMBULATORY_CARE_PROVIDER_SITE_OTHER): Payer: BLUE CROSS/BLUE SHIELD | Admitting: Pharmacist

## 2016-02-12 DIAGNOSIS — I480 Paroxysmal atrial fibrillation: Secondary | ICD-10-CM

## 2016-02-12 DIAGNOSIS — Z954 Presence of other heart-valve replacement: Secondary | ICD-10-CM | POA: Diagnosis not present

## 2016-02-12 DIAGNOSIS — Z7901 Long term (current) use of anticoagulants: Secondary | ICD-10-CM | POA: Diagnosis not present

## 2016-02-12 DIAGNOSIS — I4891 Unspecified atrial fibrillation: Secondary | ICD-10-CM

## 2016-02-12 DIAGNOSIS — Z952 Presence of prosthetic heart valve: Secondary | ICD-10-CM

## 2016-02-12 LAB — POCT INR: INR: 2

## 2016-02-29 ENCOUNTER — Other Ambulatory Visit: Payer: Self-pay | Admitting: Cardiovascular Disease

## 2016-03-11 ENCOUNTER — Ambulatory Visit (INDEPENDENT_AMBULATORY_CARE_PROVIDER_SITE_OTHER): Payer: BLUE CROSS/BLUE SHIELD | Admitting: Pharmacist Clinician (PhC)/ Clinical Pharmacy Specialist

## 2016-03-11 DIAGNOSIS — Z954 Presence of other heart-valve replacement: Secondary | ICD-10-CM | POA: Diagnosis not present

## 2016-03-11 DIAGNOSIS — Z952 Presence of prosthetic heart valve: Secondary | ICD-10-CM

## 2016-03-11 DIAGNOSIS — Z7901 Long term (current) use of anticoagulants: Secondary | ICD-10-CM | POA: Diagnosis not present

## 2016-03-11 DIAGNOSIS — I480 Paroxysmal atrial fibrillation: Secondary | ICD-10-CM

## 2016-03-11 DIAGNOSIS — I4891 Unspecified atrial fibrillation: Secondary | ICD-10-CM | POA: Diagnosis not present

## 2016-03-11 LAB — POCT INR: INR: 3.7

## 2016-03-19 DIAGNOSIS — H5203 Hypermetropia, bilateral: Secondary | ICD-10-CM | POA: Diagnosis not present

## 2016-03-31 ENCOUNTER — Ambulatory Visit (INDEPENDENT_AMBULATORY_CARE_PROVIDER_SITE_OTHER): Payer: BLUE CROSS/BLUE SHIELD | Admitting: Pharmacist Clinician (PhC)/ Clinical Pharmacy Specialist

## 2016-03-31 DIAGNOSIS — I4891 Unspecified atrial fibrillation: Secondary | ICD-10-CM | POA: Diagnosis not present

## 2016-03-31 DIAGNOSIS — Z7901 Long term (current) use of anticoagulants: Secondary | ICD-10-CM | POA: Diagnosis not present

## 2016-03-31 DIAGNOSIS — Z952 Presence of prosthetic heart valve: Secondary | ICD-10-CM

## 2016-03-31 LAB — POCT INR: INR: 4.2

## 2016-04-23 ENCOUNTER — Ambulatory Visit (INDEPENDENT_AMBULATORY_CARE_PROVIDER_SITE_OTHER): Payer: BLUE CROSS/BLUE SHIELD | Admitting: Pharmacist

## 2016-04-23 DIAGNOSIS — I4891 Unspecified atrial fibrillation: Secondary | ICD-10-CM

## 2016-04-23 DIAGNOSIS — Z952 Presence of prosthetic heart valve: Secondary | ICD-10-CM | POA: Diagnosis not present

## 2016-04-23 DIAGNOSIS — Z7901 Long term (current) use of anticoagulants: Secondary | ICD-10-CM

## 2016-04-23 LAB — POCT INR: INR: 2.2

## 2016-05-05 ENCOUNTER — Other Ambulatory Visit: Payer: Self-pay | Admitting: Cardiovascular Disease

## 2016-06-22 ENCOUNTER — Ambulatory Visit (INDEPENDENT_AMBULATORY_CARE_PROVIDER_SITE_OTHER): Payer: BLUE CROSS/BLUE SHIELD | Admitting: Physician Assistant

## 2016-06-22 ENCOUNTER — Emergency Department (HOSPITAL_COMMUNITY)
Admission: EM | Admit: 2016-06-22 | Discharge: 2016-06-22 | Disposition: A | Discharge status: Home / Self Care | Payer: BLUE CROSS/BLUE SHIELD | Attending: Emergency Medicine | Admitting: Emergency Medicine

## 2016-06-22 ENCOUNTER — Encounter (HOSPITAL_COMMUNITY): Payer: Self-pay | Admitting: *Deleted

## 2016-06-22 ENCOUNTER — Ambulatory Visit (INDEPENDENT_AMBULATORY_CARE_PROVIDER_SITE_OTHER): Payer: BLUE CROSS/BLUE SHIELD

## 2016-06-22 VITALS — BP 122/76 | HR 70 | Temp 97.9°F | Resp 16 | Ht 67.0 in | Wt 262.0 lb

## 2016-06-22 DIAGNOSIS — R0602 Shortness of breath: Secondary | ICD-10-CM

## 2016-06-22 DIAGNOSIS — Z79899 Other long term (current) drug therapy: Secondary | ICD-10-CM | POA: Insufficient documentation

## 2016-06-22 DIAGNOSIS — Z7901 Long term (current) use of anticoagulants: Secondary | ICD-10-CM

## 2016-06-22 DIAGNOSIS — I110 Hypertensive heart disease with heart failure: Secondary | ICD-10-CM | POA: Insufficient documentation

## 2016-06-22 DIAGNOSIS — I252 Old myocardial infarction: Secondary | ICD-10-CM | POA: Diagnosis not present

## 2016-06-22 DIAGNOSIS — J45909 Unspecified asthma, uncomplicated: Secondary | ICD-10-CM | POA: Diagnosis not present

## 2016-06-22 DIAGNOSIS — K922 Gastrointestinal hemorrhage, unspecified: Secondary | ICD-10-CM | POA: Diagnosis not present

## 2016-06-22 DIAGNOSIS — Z7982 Long term (current) use of aspirin: Secondary | ICD-10-CM | POA: Diagnosis not present

## 2016-06-22 DIAGNOSIS — K625 Hemorrhage of anus and rectum: Secondary | ICD-10-CM | POA: Diagnosis not present

## 2016-06-22 DIAGNOSIS — I5022 Chronic systolic (congestive) heart failure: Secondary | ICD-10-CM | POA: Diagnosis not present

## 2016-06-22 DIAGNOSIS — I2510 Atherosclerotic heart disease of native coronary artery without angina pectoris: Secondary | ICD-10-CM | POA: Diagnosis not present

## 2016-06-22 DIAGNOSIS — R195 Other fecal abnormalities: Secondary | ICD-10-CM

## 2016-06-22 DIAGNOSIS — Z951 Presence of aortocoronary bypass graft: Secondary | ICD-10-CM | POA: Insufficient documentation

## 2016-06-22 LAB — POCT CBC
Granulocyte percent: 81.9 %G — AB (ref 37–80)
HEMATOCRIT: 29.1 % — AB (ref 43.5–53.7)
Hemoglobin: 10.1 g/dL — AB (ref 14.1–18.1)
LYMPH, POC: 1.7 (ref 0.6–3.4)
MCH, POC: 31.4 pg — AB (ref 27–31.2)
MCHC: 34.6 g/dL (ref 31.8–35.4)
MCV: 90.6 fL (ref 80–97)
MID (cbc): 0.8 (ref 0–0.9)
MPV: 7.6 fL (ref 0–99.8)
POC GRANULOCYTE: 11.2 — AB (ref 2–6.9)
POC LYMPH %: 12.6 % (ref 10–50)
POC MID %: 5.5 % (ref 0–12)
Platelet Count, POC: 243 10*3/uL (ref 142–424)
RBC: 3.2 M/uL — AB (ref 4.69–6.13)
RDW, POC: 13.4 %
WBC: 13.7 10*3/uL — AB (ref 4.6–10.2)

## 2016-06-22 LAB — POCT URINALYSIS DIP (MANUAL ENTRY)
Glucose, UA: NEGATIVE
LEUKOCYTES UA: NEGATIVE
Nitrite, UA: NEGATIVE
Spec Grav, UA: >=1.03
Urobilinogen, UA: 0.2
pH, UA: 5.5

## 2016-06-22 LAB — COMPREHENSIVE METABOLIC PANEL
ALK PHOS: 56 U/L (ref 38–126)
ALT: 27 U/L (ref 17–63)
AST: 27 U/L (ref 15–41)
Albumin: 3.6 g/dL (ref 3.5–5.0)
Anion gap: 6 (ref 5–15)
BUN: 15 mg/dL (ref 6–20)
CALCIUM: 9.2 mg/dL (ref 8.9–10.3)
CO2: 25 mmol/L (ref 22–32)
CREATININE: 1.14 mg/dL (ref 0.61–1.24)
Chloride: 105 mmol/L (ref 101–111)
Glucose, Bld: 139 mg/dL — ABNORMAL HIGH (ref 65–99)
Potassium: 4.4 mmol/L (ref 3.5–5.1)
Sodium: 136 mmol/L (ref 135–145)
TOTAL PROTEIN: 6.5 g/dL (ref 6.5–8.1)
Total Bilirubin: 0.6 mg/dL (ref 0.3–1.2)

## 2016-06-22 LAB — I-STAT TROPONIN, ED: Troponin i, poc: 0.01 ng/mL (ref 0.00–0.08)

## 2016-06-22 LAB — CBC
HCT: 28.5 % — ABNORMAL LOW (ref 39.0–52.0)
Hemoglobin: 9.6 g/dL — ABNORMAL LOW (ref 13.0–17.0)
MCH: 30.9 pg (ref 26.0–34.0)
MCHC: 33.7 g/dL (ref 30.0–36.0)
MCV: 91.6 fL (ref 78.0–100.0)
PLATELETS: 257 10*3/uL (ref 150–400)
RBC: 3.11 MIL/uL — AB (ref 4.22–5.81)
RDW: 14.2 % (ref 11.5–15.5)
WBC: 13.8 10*3/uL — ABNORMAL HIGH (ref 4.0–10.5)

## 2016-06-22 LAB — TYPE AND SCREEN
ABO/RH(D): A POS
ANTIBODY SCREEN: NEGATIVE

## 2016-06-22 LAB — POC OCCULT BLOOD, ED: FECAL OCCULT BLD: POSITIVE — AB

## 2016-06-22 LAB — PROTIME-INR
INR: 1.15
Prothrombin Time: 14.7 seconds (ref 11.4–15.2)

## 2016-06-22 NOTE — Patient Instructions (Signed)
     IF you received an x-ray today, you will receive an invoice from [facility]. Please contact [facility] at [phone] with questions or concerns regarding your invoice.   IF you received labwork today, you will receive an invoice from [facility]. Please contact [facility] at [phone] with questions or concerns regarding your invoice.   Our billing staff will not be able to assist you with questions regarding bills from these companies.  You will be contacted with the lab results as soon as they are available. The fastest way to get your results is to activate your My Chart account. Instructions are located on the last page of this paperwork. If you have not heard from us regarding the results in 2 weeks, please contact this office.     

## 2016-06-22 NOTE — Progress Notes (Signed)
Donald Escobar was in the room with patient when Donald Escobar advised patient to go by ambulance to [facility].  Patient refused and drove self.

## 2016-06-22 NOTE — Progress Notes (Signed)
06/22/2016 3:50 PM   DOB: [birth year] / MRN: [id number]  SUBJECTIVE:  Donald Escobar is a 61 y.o. male presenting for breathing problems.  He has a history of CHF with most recent echo in June 2017 20-24%. aHe reports mostly SOB. Denies new leg swelling and orthopnea.    He denies cough.  He complains of some rectal bleeding that started 6 days ago. He is chronically anticoagulated on ASA and coumadin.  Has not had any new NSAIDS that he knows of.    He stopped his blood thinners due to the rectal bleeding.  He has been off of these for about 3-4 days.    He is allergic to sulfa antibiotics.   He  has a past medical history of Abnormal CT scan; Aortic insufficiency; Asthma; CAD (coronary artery disease); Chronic systolic CHF (congestive heart failure) ([place]); Hyperlipemia; Hypertension; Morbid obesity ([place]); Non Hodgkin's lymphoma ([place]) (2003); Palpitations; Personal history of kidney stones; Pneumonia (2003); and Shortness of breath dyspnea.    He  reports that he has never smoked. He has never used smokeless tobacco. He reports that he does not drink alcohol or use drugs. He  reports that he does not engage in sexual activity. The patient  has a past surgical history that includes left and right heart catheterization with coronary angiogram (N/A, 07/15/2014); TEE without cardioversion (N/A, 07/15/2014); Coronary artery bypass graft (N/A, 07/24/2014); Aortic valve replacement (N/A, 07/24/2014); and TEE without cardioversion (N/A, 07/24/2014).  His family history includes Hypertension in his mother.  Review of Systems  Constitutional: Negative for fever.  Respiratory: Positive for shortness of breath. Negative for cough and hemoptysis.   Cardiovascular: Negative for chest pain and palpitations.  Gastrointestinal: Negative for nausea.  Skin: Negative for rash.  Neurological: Negative for weakness.    The problem list and medications were reviewed and updated by myself where necessary and exist elsewhere  in the encounter.   OBJECTIVE:  BP 122/76 (BP Location: Left Arm, Patient Position: Sitting, Cuff Size: Large)   Pulse 70   Temp 97.9 F (36.6 C) (Oral)   Resp 16   Ht 5\' 7"  (1.702 m)   Wt 262 lb (118.8 kg)   SpO2 99%   BMI 41.04 kg/m   Physical Exam  Constitutional: He is oriented to person, place, and time. He appears well-developed and well-nourished.  Cardiovascular: Normal rate, regular rhythm and normal heart sounds.   Pulmonary/Chest: Effort normal and breath sounds normal.  Abdominal: Soft. Bowel sounds are normal.  Musculoskeletal: Normal range of motion.  Neurological: He is alert and oriented to person, place, and time. He has normal reflexes.    Wt Readings from Last 3 Encounters:  06/22/16 262 lb (118.8 kg)  12/11/15 260 lb 6.4 oz (118.1 kg)  12/11/15 260 lb 6.4 oz (118.1 kg)   CBC Latest Ref Rng & Units 06/22/2016 07/28/2015 07/27/2014  WBC 4.6 - 10.2 K/uL 13.7(A) 6.0 13.7(H)  Hemoglobin 14.1 - 18.1 g/dL 10.1(A) 16.7 8.7(L)  Hematocrit 43.5 - 53.7 % 29.1(A) 49.6 26.4(L)  Platelets 150 - 400 K/uL - - 156      Results for orders placed or performed in visit on 06/22/16 (from the past 72 hour(s))  POCT CBC     Status: Abnormal   Collection Time: 06/22/16  3:29 PM  Result Value Ref Range   WBC 13.7 (A) 4.6 - 10.2 K/uL   Lymph, poc 1.7 0.6 - 3.4   POC LYMPH PERCENT 12.6 10 - 50 %L   MID (  cbc) 0.8 0 - 0.9   POC MID % 5.5 0 - 12 %M   POC Granulocyte 11.2 (A) 2 - 6.9   Granulocyte percent 81.9 (A) 37 - 80 %G   RBC 3.20 (A) 4.69 - 6.13 M/uL   Hemoglobin 10.1 (A) 14.1 - 18.1 g/dL   HCT, POC 29.1 (A) 43.5 - 53.7 %   MCV 90.6 80 - 97 fL   MCH, POC 31.4 (A) 27 - 31.2 pg   MCHC 34.6 31.8 - 35.4 g/dL   RDW, POC 13.4 %   Platelet Count, POC 243 142 - 424 K/uL   MPV 7.6 0 - 99.8 fL  POCT urinalysis dipstick     Status: Abnormal   Collection Time: 06/22/16  3:33 PM  Result Value Ref Range   Color, UA yellow yellow   Clarity, UA clear clear   Glucose, UA negative  negative   Bilirubin, UA small (A) negative   Ketones, POC UA trace (5) (A) negative   Spec Grav, UA >=1.030    Blood, UA trace-lysed (A) negative   pH, UA 5.5    Protein Ur, POC =30 (A) negative   Urobilinogen, UA 0.2    Nitrite, UA Negative Negative   Leukocytes, UA Negative Negative    Dg Chest 2 View  Result Date: 06/22/2016 CLINICAL DATA:  Shortness of breath history of CHF, hypertension, Non-Hodgkins lymphoma, coronary artery disease post CABG and NSTEMI, atrial fibrillation EXAM: CHEST  2 VIEW COMPARISON:  07/28/2015 FINDINGS: Normal heart size post CABG and AVR. Atherosclerotic calcification aorta. Mediastinal contours and pulmonary vascularity normal. Lungs clear. No pleural effusion or pneumothorax. Bones demineralized scattered degenerative disc disease changes thoracic spine. IMPRESSION: Post CABG and AVR. No acute abnormalities. Aortic atherosclerosis. Electronically Signed   By: Lavonia Dana M.D.   On: 06/22/2016 15:26    ASSESSMENT AND PLAN:  Donald Escobar was seen today for breathing problem.  Diagnoses and all orders for this visit:  SOB (shortness of breath) Comments: He has a right sided ischemic pattern on EKG. This is new compared to his previous EKG in May of 17.He has stopped his blood thinners due to rectal bleeding about 4 days ago. He has dropped his H&H.  This may be driving the ischemia.  I advised that we send him by EMS however he refused this and says he will drive directly to [facility] ED.  I contacted the ED charge nurse and they are aware he is on his way.  Orders: -     DG Chest 2 View; Future -     EKG 12-Lead -     POCT urinalysis dipstick  Rectal bleeding: This work up was halted given problem one.  -     POCT CBC    The patient is advised to call or return to clinic if he does not see an improvement in symptoms, or to seek the care of the closest emergency department if he worsens with the above plan.   Philis Fendt, MHS, PA-C [facility] 06/22/2016 3:50 PM

## 2016-06-22 NOTE — ED Provider Notes (Signed)
[place] DEPT Provider Note   CSN: [id number] Arrival date & time: 06/22/16  [id number]     History   Chief Complaint Chief Complaint  Patient presents with  . GI Bleeding  . Dizziness    HPI Donald Escobar is a 61 y.o. male.  HPI The patient reports that a week ago he had a lot of cold symptoms and took over-the-counter cold preparations. He had been unaware that there was NSAIDs in some of these medications. He does take Coumadin regularly. He reports that he started to develop some blood in his stool. The beginning of the week he reports it had some cranberry color looks older like chopped liver. He went on to take some Imodium to hopefully "seal things up". Now his bowel movement is really slow down and is formed and is not seeing blood. He denies he's had any chest pain or shortness of breath. He denies he's had a syncopal episodes or dizziness. Port[place] he went to his doctor's office today to get checked and when he sat up he set up really quickly and felt a little dizzy. He reports he was referred to the emergency department for further evaluation. He never developed any vomiting or bloody emesis. Past Medical History:  Diagnosis Date  . Abnormal CT scan    a. 06/2014: Incidental findings on CTA which will need follow-up per primary care - 80mm R middle lobe lung nodule noted on CTA, heterogeneous adrenal adenoma noted at L adrenal gland, 61mm nonobstructing stone in upper pole of L kidney, scattered hepatic cysts.  . Aortic insufficiency   . Asthma   . CAD (coronary artery disease)    a. Small NSTEMI 06/2014: cath 07/15/14: 90% prox LAD diagonal branch bifurcation disease.  . Chronic systolic CHF (congestive heart failure) ([place])    a. 2D Echo 07/13/14 - EF 25-30%, severe diffuse HK with WMA< grade 2 DD, mild AS, mod-severe AI. b. TEE 07/15/14 - EF 40-45%, severe AI.  [name] Hyperlipemia   . Hypertension   . Morbid obesity ([place])   . Non Hodgkin's lymphoma ([place]) 2003   a.  tx with  anthracycline.  . Palpitations    a. 06/2014: telemetry significant for brief irregular wide complex tachycardia (question AF/NSVT), and narrow complex irregular tachycardia with P wave activity (question PAT). Quiescent after BB initiation.  . Personal history of kidney stones    current - no pain  . Pneumonia 2003  . Shortness of breath dyspnea    With exertion.    Patient Active Problem List   Diagnosis Date Noted  . Atrial fibrillation [I48.91] 09/16/2014  . Long-term (current) use of anticoagulants 09/16/2014  . S/P CABG x 2 07/26/2014  . S/P AVR 07/24/2014  . Morbid obesity ([place])   . Hyperlipemia   . Abnormal CT scan   . Palpitations   . CAD (coronary artery disease)   . Hypertension   . Asthma   . Non Hodgkin's lymphoma ([place])   . Aortic insufficiency 07/14/2014  . Acute systolic heart failure ([place]) 07/14/2014  . NSTEMI (non-ST elevated myocardial infarction) ([place]) 07/11/2014  . Lung nodule < 6cm on CT 07/11/2014  . Adenoma of left adrenal gland 07/11/2014  . Congestive heart disease [facility])     Past Surgical History:  Procedure Laterality Date  . AORTIC VALVE REPLACEMENT N/A 07/24/2014   Procedure: AORTIC VALVE REPLACEMENT (AVR);  Surgeon: Melrose Nakayama, MD;  Location: [place];  Service: Open Heart Surgery;  Laterality: N/A;  . CORONARY  ARTERY BYPASS GRAFT N/A 07/24/2014   Procedure: CORONARY ARTERY BYPASS GRAFTING (CABG), ON PUMP, TIMES TWO, USING LEFT INTERNAL MAMMARY ARTERY, LEFT GREATER SAPHENOUS VEIN HARVESTED ENDOSCOPICALLY;  Surgeon: Melrose Nakayama, MD;  Location: [place];  Service: Open Heart Surgery;  Laterality: N/A;  . LEFT AND RIGHT HEART CATHETERIZATION WITH CORONARY ANGIOGRAM N/A 07/15/2014   Procedure: LEFT AND RIGHT HEART CATHETERIZATION WITH CORONARY ANGIOGRAM;  Surgeon: [name], MD;  Location: [facility] CATH LAB;  Service: Cardiovascular;  Laterality: N/A;  . TEE WITHOUT CARDIOVERSION N/A 07/15/2014   Procedure: TRANSESOPHAGEAL ECHOCARDIOGRAM (TEE);   Surgeon: Thayer Headings, MD;  Location: [place];  Service: Cardiovascular;  Laterality: N/A;  . TEE WITHOUT CARDIOVERSION N/A 07/24/2014   Procedure: TRANSESOPHAGEAL ECHOCARDIOGRAM (TEE);  Surgeon: Melrose Nakayama, MD;  Location: [place];  Service: Open Heart Surgery;  Laterality: N/A;       Home Medications    Prior to Admission medications   Medication Sig Start Date End Date Taking? Authorizing Provider  amiodarone (PACERONE) 200 MG tablet Take 0.5 tablets (100 mg total) by mouth daily. 08/18/15   [name], MD  aspirin EC 81 MG EC tablet Take 1 tablet (81 mg total) by mouth daily. 07/16/14   Dayna N [facility], PA-C  carvedilol (COREG) 12.5 MG tablet TAKE 1 TABLET BY MOUTH TWICE A DAY 05/05/16   [name], MD  furosemide (LASIX) 40 MG tablet Take 1 tablet (40 mg total) by mouth daily. 12/22/15   [name], MD  warfarin (COUMADIN) 7.5 MG tablet TAKE 1 TO 1.5 TABLETS BY MOUTH DAILY AS DIRECTED BY COUMADIN CLINIC 03/01/16   Pixie Casino, MD    Family History Family History  Problem Relation Age of Onset  . Hypertension Mother     Social History Social History  Substance Use Topics  . Smoking status: Never Smoker  . Smokeless tobacco: Never Used  . Alcohol use No     Allergies   Sulfa antibiotics   Review of Systems Review of Systems 10 Systems reviewed and are negative for acute change except as noted in the HPI.   Physical Exam Updated Vital Signs BP 160/91 (BP Location: Right Arm)   Pulse 69   Temp 98.1 F (36.7 C) (Oral)   Resp 13   Ht 5\' 6"  (1.676 m)   Wt 262 lb (118.8 kg)   SpO2 100%   BMI 42.29 kg/m   Physical Exam  Constitutional: He is oriented to person, place, and time.  Patient is alert and nontoxic. No signs of distress. Central obesity.  HENT:  Head: Normocephalic and atraumatic.  Mouth/Throat: Oropharynx is clear and moist.  Eyes: Conjunctivae and EOM are normal.  Cardiovascular: Normal rate, regular rhythm and intact  distal pulses.   2\6 systolic ejection murmur.  Pulmonary/Chest: Effort normal and breath sounds normal.  Abdominal: Soft. He exhibits no distension. There is no tenderness. There is no guarding.  Genitourinary:  Genitourinary Comments: Rectal exam: Stool is brownish yellow. No melena. No visible blood.  Musculoskeletal: Normal range of motion. He exhibits no tenderness or deformity.  Neurological: He is alert and oriented to person, place, and time. He exhibits normal muscle tone. Coordination normal.  Skin: Skin is warm and dry.  Psychiatric: He has a normal mood and affect.     ED Treatments / Results  Labs (all labs ordered are listed, but only abnormal results are displayed) Labs Reviewed  COMPREHENSIVE METABOLIC PANEL - Abnormal; Notable for the following:  Result Value   Glucose, Bld 139 (*)    All other components within normal limits  CBC - Abnormal; Notable for the following:    WBC 13.8 (*)    RBC 3.11 (*)    Hemoglobin 9.6 (*)    HCT 28.5 (*)    All other components within normal limits  POC OCCULT BLOOD, ED - Abnormal; Notable for the following:    Fecal Occult Bld POSITIVE (*)    All other components within normal limits  PROTIME-INR  POC OCCULT BLOOD, ED  I-STAT TROPOININ, ED  TYPE AND SCREEN    EKG  EKG Interpretation  Date/Time:  Tuesday June 22 2016 16:49:11 EST Ventricular Rate:  73 PR Interval:  166 QRS Duration: 126 QT Interval:  446 QTC Calculation: 491 R Axis:   26 Text Interpretation:  Sinus rhythm with Premature supraventricular complexes Non-specific intra-ventricular conduction block T wave abnormality, consider inferolateral ischemia Abnormal ECG agree. lateral T wave inversion increased since previous Confirmed by Johnney Killian, MD, Jeannie Done [id number]) on 06/22/2016 10:38:53 PM       Radiology Dg Chest 2 View  Result Date: 06/22/2016 CLINICAL DATA:  Shortness of breath history of CHF, hypertension, Non-Hodgkins lymphoma, coronary artery  disease post CABG and NSTEMI, atrial fibrillation EXAM: CHEST  2 VIEW COMPARISON:  07/28/2015 FINDINGS: Normal heart size post CABG and AVR. Atherosclerotic calcification aorta. Mediastinal contours and pulmonary vascularity normal. Lungs clear. No pleural effusion or pneumothorax. Bones demineralized scattered degenerative disc disease changes thoracic spine. IMPRESSION: Post CABG and AVR. No acute abnormalities. Aortic atherosclerosis. Electronically Signed   By: Lavonia Dana M.D.   On: 06/22/2016 15:26    Procedures Procedures (including critical care time)  Medications Ordered in ED Medications - No data to display   Initial Impression / Assessment and Plan / ED Course  I have reviewed the triage vital signs and the nursing notes.  Pertinent labs & imaging results that were available during my care of the patient were reviewed by me and considered in my medical decision making (see chart for details).  Clinical Course      Final Clinical Impressions(s) / ED Diagnoses   Final diagnoses:  Occult GI bleeding  Anticoagulated on Coumadin   Patient describes GI bleeding earlier in the week. He had been taking NSAIDs as well as Coumadin. Symptoms have now significant improvement. Digital rectal exam although occult positive shows brownish yellow stool with no signs of melena or active bleeding. Vital signs are stable. Patient is actually subtherapeutic on his Coumadin at this time. I feel he is stable for discharge and close follow-up with his PCP. Patient is counseled on signs and symptoms for which return. New Prescriptions New Prescriptions   No medications on file     Charlesetta Shanks, MD 06/27/16 1537

## 2016-06-22 NOTE — ED Triage Notes (Signed)
PT last day he took Coumadin 4 days ago.

## 2016-06-22 NOTE — ED Triage Notes (Signed)
PT reports he was sent from PCP office to day for GI bleed on coumadin .

## 2016-07-10 ENCOUNTER — Other Ambulatory Visit: Payer: Self-pay | Admitting: Internal Medicine

## 2016-07-13 NOTE — Telephone Encounter (Signed)
ED visit on 06/22/16 to r/o upper GI bleed. Needs INR follow up ASAP

## 2016-07-14 NOTE — Telephone Encounter (Signed)
LMOM for patient to call and schedule appt.  Had GI bleed since last visit.

## 2016-07-16 ENCOUNTER — Telehealth (HOSPITAL_COMMUNITY): Payer: Self-pay | Admitting: Radiology

## 2016-07-16 NOTE — Telephone Encounter (Signed)
Called patient to schedule echocardiogram

## 2016-07-19 NOTE — Telephone Encounter (Signed)
LMOM to call back to make appt ASAP. Need appt before next refill.

## 2016-07-21 NOTE — Telephone Encounter (Signed)
LMOM again this morning.   Need to know if patient is taking warfarin and if sings or symptoms of bleeding noted.  Patient had ER visit on January 9 for potential GI bleed.  Need appointment for INR check ASAP.

## 2016-07-26 NOTE — Telephone Encounter (Signed)
INR overdue. Possible GI bleed in recent past.  Unable to reach patient.

## 2016-09-09 ENCOUNTER — Telehealth: Payer: Self-pay | Admitting: Pharmacist Clinician (PhC)/ Clinical Pharmacy Specialist

## 2016-09-09 NOTE — Telephone Encounter (Signed)
LMOM past due for INR check. Please call for appt

## 2016-10-14 ENCOUNTER — Telehealth (HOSPITAL_COMMUNITY): Payer: Self-pay | Admitting: Cardiovascular Disease

## 2016-10-15 NOTE — Telephone Encounter (Signed)
User: Verdene Rio Date/time: 10/14/2016 2:42 PM  Comment: Called pt and lmsg for him to CB. He has also been contacted on 2/2 and 2/8 to get this scheduled.   Context: [place] Schedule Orders/Appt Requests Outcome: Left Message  Phone number: [phone] Phone Type: Home Phone  Comm. type: Telephone Call type: Outgoing  Contact: [name] Relation to patient: Self  Letter:      User: Terese Door Date/time: 07/22/2016 10:59 AM  Context: [place] Schedule Orders/Appt Requests Outcome: No Answer/Busy  Phone number: [phone] Phone Type: Home Phone  Comm. type: Telephone Call type: Outgoing  Contact: [name] Relation to patient: Self  Letter:      User: Lenard Galloway V Date/time: 07/16/2016 12:34 PM  Context: [place] Schedule Orders/Appt Requests Outcome: Left Message  Phone number: [phone] Phone Type: Home Phone  Comm. type: Telephone Call type: Incoming  Contact: [name] Relation to patient: Self  Letter:       09/28/2016 lmom to return call to schedule echo and follow up with Dr Gwenlyn Found.lm

## 2016-12-17 ENCOUNTER — Other Ambulatory Visit: Payer: Self-pay | Admitting: Cardiovascular Disease

## 2016-12-31 ENCOUNTER — Other Ambulatory Visit: Payer: Self-pay | Admitting: Cardiovascular Disease

## 2017-01-12 ENCOUNTER — Telehealth: Payer: Self-pay | Admitting: Pharmacist Clinician (PhC)/ Clinical Pharmacy Specialist

## 2017-01-12 NOTE — Telephone Encounter (Signed)
LMOM for patient to schedule INR

## 2017-01-29 ENCOUNTER — Other Ambulatory Visit: Payer: Self-pay | Admitting: Cardiovascular Disease

## 2017-01-31 NOTE — Telephone Encounter (Signed)
Rx(s) sent to pharmacy electronically.  

## 2017-02-01 ENCOUNTER — Other Ambulatory Visit: Payer: Self-pay | Admitting: Internal Medicine

## 2017-02-01 NOTE — Telephone Encounter (Signed)
Last INR check 04/2016 - no refills until INR check

## 2017-02-03 ENCOUNTER — Other Ambulatory Visit: Payer: Self-pay | Admitting: Cardiovascular Disease

## 2017-02-14 ENCOUNTER — Other Ambulatory Visit: Payer: Self-pay | Admitting: Cardiovascular Disease

## 2017-02-15 NOTE — Telephone Encounter (Signed)
Rx(s) sent to pharmacy electronically.  

## 2017-02-26 ENCOUNTER — Other Ambulatory Visit: Payer: Self-pay | Admitting: Cardiovascular Disease

## 2017-02-28 NOTE — Telephone Encounter (Signed)
Rx(s) sent to pharmacy electronically.  

## 2017-03-07 ENCOUNTER — Telehealth: Payer: Self-pay | Admitting: Pharmacist Clinician (PhC)/ Clinical Pharmacy Specialist

## 2017-03-07 NOTE — Telephone Encounter (Signed)
Patient came for INR check today, had not been seen since Nov 2017 and out of warfarin > 1 month.  Had discussion about NOAC options and patient will start on

## 2017-03-10 MED ORDER — RIVAROXABAN 20 MG PO TABS: 20.0000 mg | ORAL_TABLET | Freq: Every day | ORAL | 5 refills | Status: DC

## 2017-03-10 NOTE — Telephone Encounter (Signed)
(  con't) Start patient on Xarelto 20 mg daily with meal.  He was counseled on this earlier this week.  Will give him copay card when in office next week for appt with Dr. Gwenlyn Found.

## 2017-03-16 ENCOUNTER — Other Ambulatory Visit: Payer: Self-pay

## 2017-03-16 ENCOUNTER — Ambulatory Visit (HOSPITAL_COMMUNITY): Payer: BLUE CROSS/BLUE SHIELD | Attending: Cardiology

## 2017-03-16 ENCOUNTER — Other Ambulatory Visit: Payer: Self-pay | Admitting: Cardiovascular Disease

## 2017-03-16 DIAGNOSIS — I4891 Unspecified atrial fibrillation: Secondary | ICD-10-CM | POA: Diagnosis not present

## 2017-03-16 DIAGNOSIS — Z952 Presence of prosthetic heart valve: Secondary | ICD-10-CM | POA: Diagnosis not present

## 2017-03-16 DIAGNOSIS — Z954 Presence of other heart-valve replacement: Secondary | ICD-10-CM | POA: Diagnosis not present

## 2017-03-16 DIAGNOSIS — E785 Hyperlipidemia, unspecified: Secondary | ICD-10-CM | POA: Insufficient documentation

## 2017-03-16 DIAGNOSIS — I10 Essential (primary) hypertension: Secondary | ICD-10-CM | POA: Insufficient documentation

## 2017-03-16 DIAGNOSIS — I509 Heart failure, unspecified: Secondary | ICD-10-CM | POA: Insufficient documentation

## 2017-03-16 DIAGNOSIS — I2510 Atherosclerotic heart disease of native coronary artery without angina pectoris: Secondary | ICD-10-CM | POA: Diagnosis not present

## 2017-03-16 MED ORDER — PERFLUTREN LIPID MICROSPHERE
1.0000 mL | INTRAVENOUS | Status: AC | PRN
  Administered 2017-03-16: 3 mL via INTRAVENOUS

## 2017-03-18 ENCOUNTER — Encounter: Payer: Self-pay | Admitting: Cardiovascular Disease

## 2017-03-18 ENCOUNTER — Ambulatory Visit (INDEPENDENT_AMBULATORY_CARE_PROVIDER_SITE_OTHER): Payer: BLUE CROSS/BLUE SHIELD | Admitting: Cardiovascular Disease

## 2017-03-18 ENCOUNTER — Ambulatory Visit (INDEPENDENT_AMBULATORY_CARE_PROVIDER_SITE_OTHER): Payer: BLUE CROSS/BLUE SHIELD | Admitting: Pharmacist

## 2017-03-18 VITALS — BP 143/87 | HR 53 | Ht 65.0 in | Wt 260.4 lb

## 2017-03-18 DIAGNOSIS — I5022 Chronic systolic (congestive) heart failure: Secondary | ICD-10-CM

## 2017-03-18 DIAGNOSIS — I351 Nonrheumatic aortic (valve) insufficiency: Secondary | ICD-10-CM

## 2017-03-18 DIAGNOSIS — I10 Essential (primary) hypertension: Secondary | ICD-10-CM | POA: Diagnosis not present

## 2017-03-18 DIAGNOSIS — I2510 Atherosclerotic heart disease of native coronary artery without angina pectoris: Secondary | ICD-10-CM | POA: Diagnosis not present

## 2017-03-18 DIAGNOSIS — I480 Paroxysmal atrial fibrillation: Secondary | ICD-10-CM | POA: Diagnosis not present

## 2017-03-18 DIAGNOSIS — E7800 Pure hypercholesterolemia, unspecified: Secondary | ICD-10-CM | POA: Diagnosis not present

## 2017-03-18 DIAGNOSIS — Z952 Presence of prosthetic heart valve: Secondary | ICD-10-CM

## 2017-03-18 MED ORDER — AMIODARONE HCL 100 MG PO TABS: 100.0000 mg | ORAL_TABLET | Freq: Every day | ORAL | 1 refills | Status: DC

## 2017-03-18 NOTE — Assessment & Plan Note (Signed)
History of moderate LV dysfunction initially 20% EF at the time of 2 years ago probably related to moderate aortic insufficiency. His most recent echo 03/16/17 revealed an EF of 40-45% with normal LV size.

## 2017-03-18 NOTE — Patient Instructions (Signed)
Medication Instructions: Your physician recommends that you continue on your current medications as directed. Please refer to the Current Medication list given to you today.  Decrease Amiodarone to 100 mg daily.  *Xarelto  Testing/Procedures: Your physician has requested that you have an echocardiogram prior to appointment in 1 year. Echocardiography is a painless test that uses sound waves to create images of your heart. It provides your doctor with information about the size and shape of your heart and how well your heart's chambers and valves are working. This procedure takes approximately one hour. There are no restrictions for this procedure.  Follow-Up: Your physician wants you to follow-up in: 1 year with Dr. Gwenlyn Found. You will receive a reminder letter in the mail two months in advance. If you don't receive a letter, please call our office to schedule the follow-up appointment.  If you need a refill on your cardiac medications before your next appointment, please call your pharmacy.

## 2017-03-18 NOTE — Assessment & Plan Note (Signed)
History of PACs is mitral for ablation maintaining sinus rhythm on amiodarone and Coumadin. I am going to decrease his amiodarone from 200-100 mg a day and transition him from Coumadin to Xarelto

## 2017-03-18 NOTE — Assessment & Plan Note (Signed)
History of CAD status post small non-STEMI January 2016 with cath performed 07/15/14 that showed proximal LAD and diagonal branch bifurcation disease. He underwent coronary artery bypass grafting 10 days later with a LIMA to his LAD and a vein to diagonal branch. He denies chest pain or shortness of breath.

## 2017-03-18 NOTE — Progress Notes (Signed)
03/18/2017 [name]   1956/02/17  [id number]  Primary Physician Patient, No Pcp Per Primary Cardiologist: [name] MD Renae Gloss  HPI:  Donald Escobar is a 61 y.o. male  mildly overweight single Caucasian male with no children who works in the [employer]. He was admitted with congestive heart failure on 10/29/15.[name] He ruled out for myocardial infarction. A 2-D echocardiogram revealed moderately severe LV dysfunction with a globally dilated LV and EF of 25%. He also had moderate to severe aortic insufficiency. He had packed his mitral fibrillation. He underwent right and left heart cardiac catheterization by myself on 07/15/14 revealing LAD diagonal branch disease, moderate LV dysfunction with moderate to severe aortic insufficiency. 10 days later he underwent coronary artery bypass grafting with a LIMA to his LAD, vein to diagonal branch and a bovine pericardial aortic valve replacement. He was discharged home on amiodarone, Coumadin anticoagulation. He saw [name] registered nurse practitioner in the office on 09/09/14 which time he was in sinus rhythm. He never participated in cardiac rehabilitation. He remains in sinus rhythm today. I last saw him approximately a year and a half ago. Recent 2-D echo performed several days ago revealed an EF of 40-45% with a normal functioning aortic bovine pericardial prosthesis and normal LV size. He denies chest pain or shortness of breath.   Current Meds  Medication Sig  . amiodarone (PACERONE) 100 MG tablet Take 1 tablet (100 mg total) by mouth daily.  [name] aspirin EC 81 MG EC tablet Take 1 tablet (81 mg total) by mouth daily.  . carvedilol (COREG) 12.5 MG tablet Take 1 tablet (12.5 mg total) by mouth 2 (two) times daily. <PLEASE MAKE APPOINTMENT FOR REFILLS>  . carvedilol (COREG) 12.5 MG tablet Take 1 tablet (12.5 mg total) by mouth 2 (two) times daily. <PLEASE MAKE APPOINTMENT FOR REFILLS>  . furosemide (LASIX) 40 MG tablet  Take 1 tablet (40 mg total) by mouth daily. <PLEASE MAKE APPOINTMENT FOR REFILLS>  . furosemide (LASIX) 40 MG tablet Take 1 tablet (40 mg total) by mouth daily. <PLEASE MAKE APPOINTMENT FOR REFILLS>  . levothyroxine (SYNTHROID, LEVOTHROID) 50 MCG tablet TAKE 1 TABLET BY MOUTH EVERY DAY ON AN EMPTY STOMACH  . rivaroxaban (XARELTO) 20 MG TABS tablet Take 1 tablet (20 mg total) by mouth daily with supper.  . [DISCONTINUED] amiodarone (PACERONE) 200 MG tablet TAKE 1 TABLET BY MOUTH EVERY DAY     Allergies  Allergen Reactions  . Sulfa Antibiotics Swelling    Lip swelling    Social History   Social History  . Marital status: Single    Spouse name: N/A  . Number of children: N/A  . Years of education: N/A   Occupational History  . Not on file.   Social History Main Topics  . Smoking status: Never Smoker  . Smokeless tobacco: Never Used  . Alcohol use No  . Drug use: No  . Sexual activity: No   Other Topics Concern  . Not on file   Social History Narrative  . No narrative on file     Review of Systems: General: negative for chills, fever, night sweats or weight changes.  Cardiovascular: negative for chest pain, dyspnea on exertion, edema, orthopnea, palpitations, paroxysmal nocturnal dyspnea or shortness of breath Dermatological: negative for rash Respiratory: negative for cough or wheezing Urologic: negative for hematuria Abdominal: negative for nausea, vomiting, diarrhea, bright red blood per rectum, melena, or hematemesis Neurologic: negative for visual  changes, syncope, or dizziness All other systems reviewed and are otherwise negative except as noted above.    Blood pressure (!) 143/87, pulse (!) 53, height 5\' 5"  (1.651 m), weight 260 lb 6.4 oz (118.1 kg).  General appearance: alert and no distress Neck: no adenopathy, no carotid bruit, no JVD, supple, symmetrical, trachea midline and thyroid not enlarged, symmetric, no tenderness/mass/nodules Lungs: clear to  auscultation bilaterally Heart: regular rate and rhythm, S1, S2 normal, no murmur, click, rub or gallop Extremities: extremities normal, atraumatic, no cyanosis or edema Pulses: 2+ and symmetric Skin: Skin color, texture, turgor normal. No rashes or lesions Neurologic: Alert and oriented X 3, normal strength and tone. Normal symmetric reflexes. Normal coordination and gait  EKG sinus bradycardia 53 with a nonspecific IVCD and left bundle branch block pattern. Personally reviewed this EKG.  ASSESSMENT AND PLAN:   Aortic insufficiency History of moderate aortic insufficiency and LV dysfunction status post bovine pericardial aortic valve replacement in the setting of bypass grafting in February 2016. His most recent 2-D echo performed 03/16/17 revealed this to be functioning normally without evidence of aortic insufficiency. We'll continue to follow him by 2-D echocardiography on annual basis.  Hyperlipemia History of hyperlipidemia intolerant to statin therapy followed by his PCP  CAD (coronary artery disease) History of CAD status post small non-STEMI January 2016 with cath performed 07/15/14 that showed proximal LAD and diagonal branch bifurcation disease. He underwent coronary artery bypass grafting 10 days later with a LIMA to his LAD and a vein to diagonal branch. He denies chest pain or shortness of breath.  Atrial fibrillation [I48.91] History of PACs is mitral for ablation maintaining sinus rhythm on amiodarone and Coumadin. I am going to decrease his amiodarone from 200-100 mg a day and transition him from Coumadin to Xarelto   Chronic systolic heart failure (HCC) History of moderate LV dysfunction initially 20% EF at the time of 2 years ago probably related to moderate aortic insufficiency. His most recent echo 03/16/17 revealed an EF of 40-45% with normal LV size.      [name] MD FACP,FACC,FAHA, [facility] 03/18/2017 9:55 AM

## 2017-03-18 NOTE — Assessment & Plan Note (Signed)
History of moderate aortic insufficiency and LV dysfunction status post bovine pericardial aortic valve replacement in the setting of bypass grafting in February 2016. His most recent 2-D echo performed 03/16/17 revealed this to be functioning normally without evidence of aortic insufficiency. We'll continue to follow him by 2-D echocardiography on annual basis.

## 2017-03-18 NOTE — Patient Instructions (Signed)
Pt was started on Xarelto for atrial fibrillaiton on  03/18/2017  Reviewed patients medication list.  Pt not currently on any combined P-gp and strong CYP3A4 inhibitors/inducers (ketoconazole, traconazole, ritonavir, carbamazepine, phenytoin, rifampin, St. Ewel's wort).  Reviewed labs.  SCr 1.14, Weight 118kg , estCrCl = 51ml/min .     A full discussion of the nature of anticoagulants has been carried out.  A benefit/risk analysis has been presented to the patient, so that they understand the justification for choosing anticoagulation with Xarelto at this time.  The need for compliance is stressed.  Pt is aware to take the medication once daily with the largest meal of the day.  Side effects of potential bleeding are discussed, including unusual colored urine or stools, coughing up blood or coffee ground emesis, nose bleeds or serious fall or head trauma.  Discussed signs and symptoms of stroke. The patient should avoid any OTC items containing aspirin or ibuprofen.   Call if any signs of abnormal bleeding.  Discussed financial obligations and resolved any difficulty in obtaining medication.

## 2017-03-18 NOTE — Assessment & Plan Note (Signed)
History of hyperlipidemia intolerant to statin therapy followed by his PCP

## 2017-03-25 ENCOUNTER — Other Ambulatory Visit: Payer: Self-pay | Admitting: Cardiovascular Disease

## 2017-03-25 NOTE — Telephone Encounter (Signed)
REFILL 

## 2017-04-18 ENCOUNTER — Other Ambulatory Visit: Payer: Self-pay | Admitting: Cardiovascular Disease

## 2017-05-07 ENCOUNTER — Other Ambulatory Visit: Payer: Self-pay | Admitting: Cardiovascular Disease

## 2017-05-29 ENCOUNTER — Other Ambulatory Visit: Payer: Self-pay | Admitting: Cardiovascular Disease

## 2017-05-30 NOTE — Telephone Encounter (Signed)
REFILL 

## 2017-06-28 ENCOUNTER — Encounter: Payer: Self-pay | Admitting: Family Medicine

## 2017-06-28 ENCOUNTER — Ambulatory Visit: Payer: BLUE CROSS/BLUE SHIELD | Admitting: Family Medicine

## 2017-06-28 ENCOUNTER — Other Ambulatory Visit: Payer: Self-pay

## 2017-06-28 VITALS — BP 126/88 | HR 99 | Temp 97.8°F | Resp 18 | Ht 65.0 in | Wt 262.6 lb

## 2017-06-28 DIAGNOSIS — J029 Acute pharyngitis, unspecified: Secondary | ICD-10-CM | POA: Diagnosis not present

## 2017-06-28 DIAGNOSIS — J329 Chronic sinusitis, unspecified: Secondary | ICD-10-CM

## 2017-06-28 DIAGNOSIS — B9789 Other viral agents as the cause of diseases classified elsewhere: Secondary | ICD-10-CM

## 2017-06-28 LAB — POCT RAPID STREP A (OFFICE): RAPID STREP A SCREEN: NEGATIVE

## 2017-06-28 MED ORDER — AMOXICILLIN-POT CLAVULANATE 875-125 MG PO TABS: 1.0000 | ORAL_TABLET | Freq: Two times a day (BID) | ORAL | 0 refills | Status: DC

## 2017-06-28 NOTE — Progress Notes (Signed)
Patient ID: Donald Escobar, male    DOB: [birth year]  Age: 62 y.o. MRN: [id number]  Chief Complaint  Patient presents with  . Sore Throat    started sunday and got worse yesterday   . Sinus Problem    Subjective:   62 year old man with a history of having illness beginning Sunday with a sore throat.  It got worse yesterday and he was unable to stay at work today.  He also has congestion in his sinuses.  The throat pain is mostly on the right side.  Slight cough.    Current allergies, medications, problem list, past/family and social histories reviewed.  Objective:  BP 126/88   Pulse 99   Temp 97.8 F (36.6 C) (Oral)   Resp 18   Ht 5\' 5"  (1.651 m)   Wt 262 lb 9.6 oz (119.1 kg)   SpO2 97%   BMI 43.70 kg/m   No major acute distress.  TMs normal on the right.  Has a little blue thread in his left ear.  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  His throat is erythematous without exudate but there is uvula edema.  Neck supple without significant nodes.  Chest is clear to auscultation.  Heart has a grade 2/6 systolic murmur in the left upper sternal border.  He is under the care of a cardiologist.  Assessment & Plan:   Assessment: 1. Acute pharyngitis, unspecified etiology   2. Viral sinusitis       Plan: Check strep test  Orders Placed This Encounter  Procedures  . POCT rapid strep A    Meds ordered this encounter  Medications  . amoxicillin-clavulanate (AUGMENTIN) 875-125 MG tablet    Sig: Take 1 tablet by mouth 2 (two) times daily.    Dispense:  20 tablet    Refill:  0    This prescription is only good from July 01, 2017 through July 08, 2017.  Patient is going to get the prescription if his sinuses are not doing better by this weekend.  Void the prescription after that.         Patient Instructions   Drink plenty of fluids and get enough rest  Stay off work through Wednesday, and I hope you are feeling well enough to return to work by Thursday.  If not and  if you need an extension on your excuse please contact this office for another day or 2.  Tylenol or ibuprofen for discomfort  An antihistamine decongestant such as Claritin-D or Allegra-D or Zyrtec-D can be used if necessary to open up the sinuses.  Return as needed    IF you received an x-ray today, you will receive an invoice from [facility]. Please contact [facility] at [phone] with questions or concerns regarding your invoice.   IF you received labwork today, you will receive an invoice from [place]. Please contact [facility] at [phone] with questions or concerns regarding your invoice.   Our billing staff will not be able to assist you with questions regarding bills from these companies.  You will be contacted with the lab results as soon as they are available. The fastest way to get your results is to activate your My Chart account. Instructions are located on the last page of this paperwork. If you have not heard from [place] regarding the results in 2 weeks, please contact this office.        No Follow-up on file.   Luretha Eberly, MD 06/28/2017

## 2017-06-28 NOTE — Patient Instructions (Addendum)
Drink plenty of fluids and get enough rest  Stay off work through Wednesday, and I hope you are feeling well enough to return to work by Thursday.  If not and if you need an extension on your excuse please contact this office for another day or 2.  Tylenol or ibuprofen for discomfort  An antihistamine decongestant such as Claritin-D or Allegra-D or Zyrtec-D can be used if necessary to open up the sinuses.  Return as needed    IF you received an x-ray today, you will receive an invoice from [facility]. Please contact [facility] at [phone] with questions or concerns regarding your invoice.   IF you received labwork today, you will receive an invoice from [place]. Please contact [facility] at [phone] with questions or concerns regarding your invoice.   Our billing staff will not be able to assist you with questions regarding bills from these companies.  You will be contacted with the lab results as soon as they are available. The fastest way to get your results is to activate your My Chart account. Instructions are located on the last page of this paperwork. If you have not heard from [place] regarding the results in 2 weeks, please contact this office.

## 2017-06-30 ENCOUNTER — Other Ambulatory Visit: Payer: Self-pay

## 2017-06-30 ENCOUNTER — Ambulatory Visit: Payer: BLUE CROSS/BLUE SHIELD | Admitting: Family Medicine

## 2017-06-30 ENCOUNTER — Encounter: Payer: Self-pay | Admitting: Family Medicine

## 2017-06-30 VITALS — BP 142/82 | HR 57 | Temp 98.0°F | Resp 16 | Ht 65.0 in | Wt 262.0 lb

## 2017-06-30 DIAGNOSIS — J069 Acute upper respiratory infection, unspecified: Secondary | ICD-10-CM | POA: Diagnosis not present

## 2017-06-30 DIAGNOSIS — R05 Cough: Secondary | ICD-10-CM

## 2017-06-30 DIAGNOSIS — R059 Cough, unspecified: Secondary | ICD-10-CM

## 2017-06-30 MED ORDER — BENZONATATE 100 MG PO CAPS: 100.0000 mg | ORAL_CAPSULE | Freq: Three times a day (TID) | ORAL | 0 refills | Status: DC | PRN

## 2017-06-30 MED ORDER — HYDROCODONE-HOMATROPINE 5-1.5 MG/5ML PO SYRP
ORAL_SOLUTION | ORAL | 0 refills | Status: DC
Start: 2017-06-30 — End: 2017-12-20

## 2017-06-30 NOTE — Progress Notes (Signed)
Subjective:  By signing my name below, I, Essence Howell, attest that this documentation has been prepared under the direction and in the presence of Wendie Agreste, MD Electronically Signed: Ladene Artist, ED Scribe 06/30/2017 at 1:40 PM.   Patient ID: Donald Escobar, male    DOB: [birth year], 62 y.o.   MRN: [id number]  Chief Complaint  Patient presents with  . Cough    pt was seen in the office 1/15 with these symptoms and states they have become worst.   . Nasal Congestion    pt states the nasal drainage is worst and the cough    HPI Donald Escobar is a 62 y.o. male who presents to Primary Care at [facility] for f/u. Seen 2 days ago by Dr. Linna Darner with sore throat starting 2 days prior and nasal congestion, slight cough, afebrile. Had some erythema in the throat, no exudate, some uvula edema. Neg rapid strep test. Symptomatic care was discussed along with out of work x 2 days. Possible antibiotic to start tomorrow if not improving. H/o asthma, CAD, a-fib, CHF by problem list.  Pt states symptoms began as sore throat. He is now having postnasal drip, "froggy" voice change, worsened cough and nasal congestion. He has tried Mucinex without significant relief. Denies fever, sob. H/o asthma but has not had to use his albuterol inhaler. No sick contacts. Did not receive the flu vaccine this yr.  Patient Active Problem List   Diagnosis Date Noted  . Chronic systolic heart failure ([place]) 03/18/2017  . Atrial fibrillation [I48.91] 09/16/2014  . Long-term (current) use of anticoagulants 09/16/2014  . S/P CABG x 2 07/26/2014  . S/P AVR 07/24/2014  . Morbid obesity ([place])   . Hyperlipemia   . Abnormal CT scan   . Palpitations   . CAD (coronary artery disease)   . Hypertension   . Asthma   . Non Hodgkin's lymphoma ([place])   . Aortic insufficiency 07/14/2014  . Acute systolic heart failure ([place]) 07/14/2014  . NSTEMI (non-ST elevated myocardial infarction) ([place]) 07/11/2014  . Lung nodule < 6cm on CT  07/11/2014  . Adenoma of left adrenal gland 07/11/2014  . Congestive heart disease ([place])    Past Medical History:  Diagnosis Date  . Abnormal CT scan    a. 06/2014: Incidental findings on CTA which will need follow-up per primary care - 1mm R middle lobe lung nodule noted on CTA, heterogeneous adrenal adenoma noted at L adrenal gland, 12mm nonobstructing stone in upper pole of L kidney, scattered hepatic cysts.  . Aortic insufficiency   . Asthma   . CAD (coronary artery disease)    a. Small NSTEMI 06/2014: cath 07/15/14: 90% prox LAD diagonal branch bifurcation disease.  . Chronic systolic CHF (congestive heart failure) ([place])    a. 2D Echo 07/13/14 - EF 25-30%, severe diffuse HK with WMA< grade 2 DD, mild AS, mod-severe AI. b. TEE 07/15/14 - EF 40-45%, severe AI.  [name] Hyperlipemia   . Hypertension   . Morbid obesity ([place])   . Non Hodgkin's lymphoma ([place]) 2003   a.  tx with anthracycline.  . Palpitations    a. 06/2014: telemetry significant for brief irregular wide complex tachycardia (question AF/NSVT), and narrow complex irregular tachycardia with P wave activity (question PAT). Quiescent after BB initiation.  . Personal history of kidney stones    current - no pain  . Pneumonia 2003  . Shortness of breath dyspnea    With exertion.   Past Surgical  History:  Procedure Laterality Date  . AORTIC VALVE REPLACEMENT N/A 07/24/2014   Procedure: AORTIC VALVE REPLACEMENT (AVR);  Surgeon: Melrose Nakayama, MD;  Location: [place];  Service: Open Heart Surgery;  Laterality: N/A;  . CORONARY ARTERY BYPASS GRAFT N/A 07/24/2014   Procedure: CORONARY ARTERY BYPASS GRAFTING (CABG), ON PUMP, TIMES TWO, USING LEFT INTERNAL MAMMARY ARTERY, LEFT GREATER SAPHENOUS VEIN HARVESTED ENDOSCOPICALLY;  Surgeon: Melrose Nakayama, MD;  Location: [place];  Service: Open Heart Surgery;  Laterality: N/A;  . LEFT AND RIGHT HEART CATHETERIZATION WITH CORONARY ANGIOGRAM N/A 07/15/2014   Procedure: LEFT AND RIGHT HEART  CATHETERIZATION WITH CORONARY ANGIOGRAM;  Surgeon: [name], MD;  Location: [facility] CATH LAB;  Service: Cardiovascular;  Laterality: N/A;  . TEE WITHOUT CARDIOVERSION N/A 07/15/2014   Procedure: TRANSESOPHAGEAL ECHOCARDIOGRAM (TEE);  Surgeon: Thayer Headings, MD;  Location: [place];  Service: Cardiovascular;  Laterality: N/A;  . TEE WITHOUT CARDIOVERSION N/A 07/24/2014   Procedure: TRANSESOPHAGEAL ECHOCARDIOGRAM (TEE);  Surgeon: Melrose Nakayama, MD;  Location: [place];  Service: Open Heart Surgery;  Laterality: N/A;   Allergies  Allergen Reactions  . Sulfa Antibiotics Swelling    Lip swelling   Prior to Admission medications   Medication Sig Start Date End Date Taking? Authorizing Provider  amiodarone (PACERONE) 100 MG tablet Take 1 tablet (100 mg total) by mouth daily. 03/18/17  Yes [name], MD  amoxicillin-clavulanate (AUGMENTIN) 875-125 MG tablet Take 1 tablet by mouth 2 (two) times daily. 06/28/17  Yes Posey Boyer, MD  aspirin EC 81 MG EC tablet Take 1 tablet (81 mg total) by mouth daily. 07/16/14  Yes Dunn, Dayna N, PA-C  carvedilol (COREG) 12.5 MG tablet Take 1 tablet (12.5 mg total) by mouth 2 (two) times daily with a meal. 03/25/17  Yes [name], MD  furosemide (LASIX) 40 MG tablet TAKE 1 TABLET BY MOUTH EVERY DAY 05/30/17  Yes [name], MD  levothyroxine (SYNTHROID, LEVOTHROID) 50 MCG tablet TAKE 1 TABLET BY MOUTH EVERY DAY ON AN EMPTY STOMACH 02/03/17  Yes [provider]  rivaroxaban (XARELTO) 20 MG TABS tablet Take 1 tablet (20 mg total) by mouth daily with supper. 03/10/17  Yes [name], MD   Social History   Socioeconomic History  . Marital status: Single    Spouse name: Not on file  . Number of children: Not on file  . Years of education: Not on file  . Highest education level: Not on file  Social Needs  . Financial resource strain: Not on file  . Food insecurity - worry: Not on file  . Food insecurity - inability: Not  on file  . Transportation needs - medical: Not on file  . Transportation needs - non-medical: Not on file  Occupational History  . Not on file  Tobacco Use  . Smoking status: Never Smoker  . Smokeless tobacco: Never Used  Substance and Sexual Activity  . Alcohol use: No  . Drug use: No  . Sexual activity: No  Other Topics Concern  . Not on file  Social History Narrative  . Not on file   Review of Systems  Constitutional: Negative for fever.  HENT: Positive for congestion, postnasal drip, sore throat and voice change.   Respiratory: Positive for cough. Negative for shortness of breath.       Objective:   Physical Exam  Constitutional: He is oriented to person, place, and time. He appears well-developed and well-nourished.  HENT:  Head: Normocephalic and atraumatic.  Right Ear: Tympanic membrane, external ear and ear canal normal.  Left Ear: Tympanic membrane, external ear and ear canal normal.  Nose: No rhinorrhea. Right sinus exhibits no maxillary sinus tenderness and no frontal sinus tenderness. Left sinus exhibits no maxillary sinus tenderness and no frontal sinus tenderness.  Mouth/Throat: Mucous membranes are normal. Posterior oropharyngeal erythema (minimal) present. No oropharyngeal exudate.  Small blue string within the wax of L canal TMs pearly grey Mouth/Throat: Slight prominent uvula, no sift, no apparent abscess. Moist mucosa.  Eyes: Conjunctivae are normal. Pupils are equal, round, and reactive to light.  Neck: Neck supple.  Cardiovascular: Normal rate, regular rhythm, normal heart sounds and intact distal pulses.  No murmur heard. Pulmonary/Chest: Effort normal and breath sounds normal. He has no wheezes. He has no rhonchi. He has no rales.  Abdominal: Soft. There is no tenderness.  Lymphadenopathy:    He has no cervical adenopathy.  Neurological: He is alert and oriented to person, place, and time.  Skin: Skin is warm and dry. No rash noted.  Psychiatric: He  has a normal mood and affect. His behavior is normal.  Vitals reviewed.  Vitals:   06/30/17 1326  BP: (!) 142/82  Pulse: (!) 57  Resp: 16  Temp: 98 F (36.7 C)  TempSrc: Oral  SpO2: 97%  Weight: 262 lb (118.8 kg)  Height: 5\' 5"  (1.651 m)      Assessment & Plan:    READ BONELLI is a 62 y.o. male Acute upper respiratory infection  Cough - Plan: benzonatate (TESSALON) 100 MG capsule, HYDROcodone-homatropine (HYCODAN) 5-1.5 MG/5ML syrup  Still appears to be viral illness. No apparent signs of strep pharyngitis at this time and prior strep test was negative. Reported history of wheezing, but clear on exam today. Afebrile. Reassuring exam.  -Continue symptomatic care, added Tessalon Perles if needed throughout the day, hydrocodone cough syrup at nighttime if needed. Side effects and precautions were discussed with hydrocodone cough syrup.  -Albuterol if needed for wheezing, states he has inhaler at home. RTC precautions if persistent/worsening wheezing  Meds ordered this encounter  Medications  . benzonatate (TESSALON) 100 MG capsule    Sig: Take 1 capsule (100 mg total) by mouth 3 (three) times daily as needed for cough.    Dispense:  20 capsule    Refill:  0  . HYDROcodone-homatropine (HYCODAN) 5-1.5 MG/5ML syrup    Sig: 80m by mouth a bedtime as needed for cough.    Dispense:  120 mL    Refill:  0   Patient Instructions   Unfortunately your current symptoms appear to still be due to a virus. Lungs appear clear, temperature was normal. Make sure you drink plenty of fluids, rest as needed, Mucinex over-the-counter is fine for cough or you can try the new cough pills 3 times per day as needed.   At bedtime if you are having difficulty sleeping due to cough, you can try the hydrocodone cough syrup, but use that only at bedtime and watch for sedation or dizziness with that medicine. Do not use the  hydrocodone cough syrup if you are short of breath or wheezing (that is when you  want to use albuterol).  If you require albuterol more than 2-3 times per day or more than 2-3 days in a row, return for recheck.   Return to the clinic or go to the nearest emergency room if any of your symptoms worsen or new symptoms occur.  Upper Respiratory Infection, Adult Most upper respiratory infections (URIs) are caused by a virus. A URI affects the nose, throat, and upper air passages. The most common type of URI is often called "the common cold." Follow these instructions at home:  Take medicines only as told by your doctor.  Gargle warm saltwater or take cough drops to comfort your throat as told by your doctor.  Use a warm mist humidifier or inhale steam from a shower to increase air moisture. This may make it easier to breathe.  Drink enough fluid to keep your pee (urine) clear or pale yellow.  Eat soups and other clear broths.  Have a healthy diet.  Rest as needed.  Go back to work when your fever is gone or your doctor says it is okay. ? You may need to stay home longer to avoid giving your URI to others. ? You can also wear a face mask and wash your hands often to prevent spread of the virus.  Use your inhaler more if you have asthma.  Do not use any tobacco products, including cigarettes, chewing tobacco, or electronic cigarettes. If you need help quitting, ask your doctor. Contact a doctor if:  You are getting worse, not better.  Your symptoms are not helped by medicine.  You have chills.  You are getting more short of breath.  You have brown or red mucus.  You have yellow or brown discharge from your nose.  You have pain in your face, especially when you bend forward.  You have a fever.  You have puffy (swollen) neck glands.  You have pain while swallowing.  You have white areas in the back of your throat. Get help right away if:  You have very bad or constant: ? Headache. ? Ear pain. ? Pain in your forehead, behind your eyes, and over  your cheekbones (sinus pain). ? Chest pain.  You have long-lasting (chronic) lung disease and any of the following: ? Wheezing. ? Long-lasting cough. ? Coughing up blood. ? A change in your usual mucus.  You have a stiff neck.  You have changes in your: ? Vision. ? Hearing. ? Thinking. ? Mood. This information is not intended to replace advice given to you by your health care provider. Make sure you discuss any questions you have with your health care provider. Document Released: 11/17/2007 Document Revised: 02/01/2016 Document Reviewed: 09/05/2013 Elsevier Interactive Patient Education  2018 [place].  Cough, Adult Coughing is a reflex that clears your throat and your airways. Coughing helps to heal and protect your lungs. It is normal to cough occasionally, but a cough that happens with other symptoms or lasts a long time may be a sign of a condition that needs treatment. A cough may last only 2-3 weeks (acute), or it may last longer than 8 weeks (chronic). What are the causes? Coughing is commonly caused by:  Breathing in substances that irritate your lungs.  A viral or bacterial respiratory infection.  Allergies.  Asthma.  Postnasal drip.  Smoking.  Acid backing up from the stomach into the esophagus (gastroesophageal reflux).  Certain medicines.  Chronic lung problems, including COPD (or rarely, lung cancer).  Other medical conditions such as heart failure.  Follow these instructions at home: Pay attention to any changes in your symptoms. Take these actions to help with your discomfort:  Take medicines only as told by your health care provider. ? If you were prescribed an antibiotic medicine, take it as told by your  health care provider. Do not stop taking the antibiotic even if you start to feel better. ? Talk with your health care provider before you take a cough suppressant medicine.  Drink enough fluid to keep your urine clear or pale yellow.  If  the air is dry, use a cold steam vaporizer or humidifier in your bedroom or your home to help loosen secretions.  Avoid anything that causes you to cough at work or at home.  If your cough is worse at night, try sleeping in a semi-upright position.  Avoid cigarette smoke. If you smoke, quit smoking. If you need help quitting, ask your health care provider.  Avoid caffeine.  Avoid alcohol.  Rest as needed.  Contact a health care provider if:  You have new symptoms.  You cough up pus.  Your cough does not get better after 2-3 weeks, or your cough gets worse.  You cannot control your cough with suppressant medicines and you are losing sleep.  You develop pain that is getting worse or pain that is not controlled with pain medicines.  You have a fever.  You have unexplained weight loss.  You have night sweats. Get help right away if:  You cough up blood.  You have difficulty breathing.  Your heartbeat is very fast. This information is not intended to replace advice given to you by your health care provider. Make sure you discuss any questions you have with your health care provider. Document Released: 11/27/2010 Document Revised: 11/06/2015 Document Reviewed: 08/07/2014 Elsevier Interactive Patient Education  2018 [employer].    IF you received an x-ray today, you will receive an invoice from [facility]. Please contact [facility]iology at [phone] with questions or concerns regarding your invoice.   IF you received labwork today, you will receive an invoice from [facility]. Please contact [facility] at [phone] with questions or concerns regarding your invoice.   Our billing staff will not be able to assist you with questions regarding bills from these companies.  You will be contacted with the lab results as soon as they are available. The fastest way to get your results is to activate your My Chart account. Instructions are located on the last page  of this paperwork. If you have not heard from [place] regarding the results in 2 weeks, please contact this office.       I personally performed the services described in this documentation, which was scribed in my presence. The recorded information has been reviewed and considered for accuracy and completeness, addended by me as needed, and agree with information above.  Signed,   Merri Ray, MD Primary [facility] at [facility].  06/30/17 2:02 PM

## 2017-06-30 NOTE — Patient Instructions (Addendum)
Unfortunately your current symptoms appear to still be due to a virus. Lungs appear clear, temperature was normal. Make sure you drink plenty of fluids, rest as needed, Mucinex over-the-counter is fine for cough or you can try the new cough pills 3 times per day as needed.   At bedtime if you are having difficulty sleeping due to cough, you can try the hydrocodone cough syrup, but use that only at bedtime and watch for sedation or dizziness with that medicine. Do not use the  hydrocodone cough syrup if you are short of breath or wheezing (that is when you want to use albuterol).  If you require albuterol more than 2-3 times per day or more than 2-3 days in a row, return for recheck.   Return to the clinic or go to the nearest emergency room if any of your symptoms worsen or new symptoms occur.   Upper Respiratory Infection, Adult Most upper respiratory infections (URIs) are caused by a virus. A URI affects the nose, throat, and upper air passages. The most common type of URI is often called "the common cold." Follow these instructions at home:  Take medicines only as told by your doctor.  Gargle warm saltwater or take cough drops to comfort your throat as told by your doctor.  Use a warm mist humidifier or inhale steam from a shower to increase air moisture. This may make it easier to breathe.  Drink enough fluid to keep your pee (urine) clear or pale yellow.  Eat soups and other clear broths.  Have a healthy diet.  Rest as needed.  Go back to work when your fever is gone or your doctor says it is okay. ? You may need to stay home longer to avoid giving your URI to others. ? You can also wear a face mask and wash your hands often to prevent spread of the virus.  Use your inhaler more if you have asthma.  Do not use any tobacco products, including cigarettes, chewing tobacco, or electronic cigarettes. If you need help quitting, ask your doctor. Contact a doctor if:  You are getting  worse, not better.  Your symptoms are not helped by medicine.  You have chills.  You are getting more short of breath.  You have brown or red mucus.  You have yellow or brown discharge from your nose.  You have pain in your face, especially when you bend forward.  You have a fever.  You have puffy (swollen) neck glands.  You have pain while swallowing.  You have white areas in the back of your throat. Get help right away if:  You have very bad or constant: ? Headache. ? Ear pain. ? Pain in your forehead, behind your eyes, and over your cheekbones (sinus pain). ? Chest pain.  You have long-lasting (chronic) lung disease and any of the following: ? Wheezing. ? Long-lasting cough. ? Coughing up blood. ? A change in your usual mucus.  You have a stiff neck.  You have changes in your: ? Vision. ? Hearing. ? Thinking. ? Mood. This information is not intended to replace advice given to you by your health care provider. Make sure you discuss any questions you have with your health care provider. Document Released: 11/17/2007 Document Revised: 02/01/2016 Document Reviewed: 09/05/2013 Elsevier Interactive Patient Education  2018 [place].  Cough, Adult Coughing is a reflex that clears your throat and your airways. Coughing helps to heal and protect your lungs. It is normal to cough  occasionally, but a cough that happens with other symptoms or lasts a long time may be a sign of a condition that needs treatment. A cough may last only 2-3 weeks (acute), or it may last longer than 8 weeks (chronic). What are the causes? Coughing is commonly caused by:  Breathing in substances that irritate your lungs.  A viral or bacterial respiratory infection.  Allergies.  Asthma.  Postnasal drip.  Smoking.  Acid backing up from the stomach into the esophagus (gastroesophageal reflux).  Certain medicines.  Chronic lung problems, including COPD (or rarely, lung  cancer).  Other medical conditions such as heart failure.  Follow these instructions at home: Pay attention to any changes in your symptoms. Take these actions to help with your discomfort:  Take medicines only as told by your health care provider. ? If you were prescribed an antibiotic medicine, take it as told by your health care provider. Do not stop taking the antibiotic even if you start to feel better. ? Talk with your health care provider before you take a cough suppressant medicine.  Drink enough fluid to keep your urine clear or pale yellow.  If the air is dry, use a cold steam vaporizer or humidifier in your bedroom or your home to help loosen secretions.  Avoid anything that causes you to cough at work or at home.  If your cough is worse at night, try sleeping in a semi-upright position.  Avoid cigarette smoke. If you smoke, quit smoking. If you need help quitting, ask your health care provider.  Avoid caffeine.  Avoid alcohol.  Rest as needed.  Contact a health care provider if:  You have new symptoms.  You cough up pus.  Your cough does not get better after 2-3 weeks, or your cough gets worse.  You cannot control your cough with suppressant medicines and you are losing sleep.  You develop pain that is getting worse or pain that is not controlled with pain medicines.  You have a fever.  You have unexplained weight loss.  You have night sweats. Get help right away if:  You cough up blood.  You have difficulty breathing.  Your heartbeat is very fast. This information is not intended to replace advice given to you by your health care provider. Make sure you discuss any questions you have with your health care provider. Document Released: 11/27/2010 Document Revised: 11/06/2015 Document Reviewed: 08/07/2014 Elsevier Interactive Patient Education  2018 [employer].    IF you received an x-ray today, you will receive an invoice from [facility]  Radiology. Please contact [facility] Radiology at [phone] with questions or concerns regarding your invoice.   IF you received labwork today, you will receive an invoice from [place]. Please contact [facility] at [phone] with questions or concerns regarding your invoice.   Our billing staff will not be able to assist you with questions regarding bills from these companies.  You will be contacted with the lab results as soon as they are available. The fastest way to get your results is to activate your My Chart account. Instructions are located on the last page of this paperwork. If you have not heard from [place] regarding the results in 2 weeks, please contact this office.

## 2017-07-04 ENCOUNTER — Encounter: Payer: Self-pay | Admitting: Family Medicine

## 2017-07-04 ENCOUNTER — Other Ambulatory Visit: Payer: Self-pay

## 2017-07-04 ENCOUNTER — Ambulatory Visit: Payer: BLUE CROSS/BLUE SHIELD | Admitting: Family Medicine

## 2017-07-04 VITALS — BP 150/94 | HR 85 | Temp 97.8°F | Resp 16 | Ht 65.75 in | Wt 262.0 lb

## 2017-07-04 DIAGNOSIS — I10 Essential (primary) hypertension: Secondary | ICD-10-CM | POA: Diagnosis not present

## 2017-07-04 DIAGNOSIS — J320 Chronic maxillary sinusitis: Secondary | ICD-10-CM | POA: Diagnosis not present

## 2017-07-04 DIAGNOSIS — T162XXA Foreign body in left ear, initial encounter: Secondary | ICD-10-CM

## 2017-07-04 DIAGNOSIS — R739 Hyperglycemia, unspecified: Secondary | ICD-10-CM | POA: Diagnosis not present

## 2017-07-04 LAB — GLUCOSE, POCT (MANUAL RESULT ENTRY): POC GLUCOSE: 96 mg/dL (ref 70–99)

## 2017-07-04 LAB — POCT GLYCOSYLATED HEMOGLOBIN (HGB A1C): HEMOGLOBIN A1C: 5.7

## 2017-07-04 MED ORDER — FLUTICASONE PROPIONATE 50 MCG/ACT NA SUSP: 2.0000 | Freq: Every day | NASAL | 0 refills | Status: DC

## 2017-07-04 MED ORDER — DOXYCYCLINE HYCLATE 100 MG PO TABS: 100.0000 mg | ORAL_TABLET | Freq: Two times a day (BID) | ORAL | 0 refills | Status: DC

## 2017-07-04 MED ORDER — PREDNISONE 10 MG PO TABS: ORAL_TABLET | ORAL | 0 refills | Status: DC

## 2017-07-04 MED ORDER — PREDNISONE 20 MG PO TABS: ORAL_TABLET | ORAL | 0 refills | Status: DC

## 2017-07-04 MED ORDER — AZITHROMYCIN 250 MG PO TABS: ORAL_TABLET | ORAL | 0 refills | Status: DC

## 2017-07-04 NOTE — Progress Notes (Signed)
Subjective:  By signing my name below, I, Essence Howell, attest that this documentation has been prepared under the direction and in the presence of Delman Cheadle, MD Electronically Signed: Ladene Artist, ED Scribe 07/04/2017 at 5:15 PM.   Patient ID: Donald Escobar, male    DOB: 1955-10-07, 62 y.o.   MRN: [id number]  Chief Complaint  Patient presents with  . Cough    x 4-5 days  PRODUCTIVE with yellow green mucus  . Sinus Problem    little bit pressure in face   HPI Donald Escobar is a 62 y.o. male who presents to Primary Care at [facility]. Pt has had 8 days of symptoms. Seen in office by colleagues 6 days and 4 days prior. Initially given script for Augmentin which he could fill if symptoms persisted for an additional 3 days and advised antihistamine with decongestant. Rapid strep neg. At visit 4 days ago, advised continued symptomatic treatment for viral illness with tessalon hycodan and albuterol. QT on 03/18/17 was 456.  Pt states that symptoms initially started as a sore throat which has resolved, then progressed to rhinorrhea, productive cough with yellow/green mucus, postnasal drip, sinus pressure in bilateral maxillary, minimal wheezing which he states is chronic. Also reports nasal congestion throughout the yr which he attributes to sleeping on the L side. He has tried Mucinex DM which he states made him cough more, tessalon and hycodan with slight improvement. He was using alka seltzer without improvement. Pt started Augmentin 4 days ago which has not improved symptoms. States z-pak typically works better for him. Denies fever, chills, gum or teeth pain, vision disturbances, sob, cp.  Past Medical History:  Diagnosis Date  . Abnormal CT scan    a. 06/2014: Incidental findings on CTA which will need follow-up per primary care - 58mm R middle lobe lung nodule noted on CTA, heterogeneous adrenal adenoma noted at L adrenal gland, 29mm nonobstructing stone in upper pole of L kidney, scattered hepatic  cysts.  . Aortic insufficiency   . Asthma   . CAD (coronary artery disease)    a. Small NSTEMI 06/2014: cath 07/15/14: 90% prox LAD diagonal branch bifurcation disease.  . Chronic systolic CHF (congestive heart failure) ([place])    a. 2D Echo 07/13/14 - EF 25-30%, severe diffuse HK with WMA< grade 2 DD, mild AS, mod-severe AI. b. TEE 07/15/14 - EF 40-45%, severe AI.  [name] Hyperlipemia   . Hypertension   . Morbid obesity ([place])   . Non Hodgkin's lymphoma ([place]) 2003   a.  tx with anthracycline.  . Palpitations    a. 06/2014: telemetry significant for brief irregular wide complex tachycardia (question AF/NSVT), and narrow complex irregular tachycardia with P wave activity (question PAT). Quiescent after BB initiation.  . Personal history of kidney stones    current - no pain  . Pneumonia 2003  . Shortness of breath dyspnea    With exertion.   Current Outpatient Medications on File Prior to Visit  Medication Sig Dispense Refill  . amiodarone (PACERONE) 100 MG tablet Take 1 tablet (100 mg total) by mouth daily. 90 tablet 1  . aspirin EC 81 MG EC tablet Take 1 tablet (81 mg total) by mouth daily. 30 tablet 6  . benzonatate (TESSALON) 100 MG capsule Take 1 capsule (100 mg total) by mouth 3 (three) times daily as needed for cough. 20 capsule 0  . carvedilol (COREG) 12.5 MG tablet Take 1 tablet (12.5 mg total) by mouth 2 (two) times daily  with a meal. 60 tablet 11  . furosemide (LASIX) 40 MG tablet TAKE 1 TABLET BY MOUTH EVERY DAY 90 tablet 3  . HYDROcodone-homatropine (HYCODAN) 5-1.5 MG/5ML syrup 61m by mouth a bedtime as needed for cough. 120 mL 0  . levothyroxine (SYNTHROID, LEVOTHROID) 50 MCG tablet TAKE 1 TABLET BY MOUTH EVERY DAY ON AN EMPTY STOMACH  3  . rivaroxaban (XARELTO) 20 MG TABS tablet Take 1 tablet (20 mg total) by mouth daily with supper. 30 tablet 5   No current facility-administered medications on file prior to visit.    Allergies  Allergen Reactions  . Sulfa Antibiotics Swelling     Lip swelling   Past Surgical History:  Procedure Laterality Date  . AORTIC VALVE REPLACEMENT N/A 07/24/2014   Procedure: AORTIC VALVE REPLACEMENT (AVR);  Surgeon: Melrose Nakayama, MD;  Location: [place];  Service: Open Heart Surgery;  Laterality: N/A;  . CORONARY ARTERY BYPASS GRAFT N/A 07/24/2014   Procedure: CORONARY ARTERY BYPASS GRAFTING (CABG), ON PUMP, TIMES TWO, USING LEFT INTERNAL MAMMARY ARTERY, LEFT GREATER SAPHENOUS VEIN HARVESTED ENDOSCOPICALLY;  Surgeon: Melrose Nakayama, MD;  Location: [place];  Service: Open Heart Surgery;  Laterality: N/A;  . LEFT AND RIGHT HEART CATHETERIZATION WITH CORONARY ANGIOGRAM N/A 07/15/2014   Procedure: LEFT AND RIGHT HEART CATHETERIZATION WITH CORONARY ANGIOGRAM;  Surgeon: [name], MD;  Location: [facility] CATH LAB;  Service: Cardiovascular;  Laterality: N/A;  . TEE WITHOUT CARDIOVERSION N/A 07/15/2014   Procedure: TRANSESOPHAGEAL ECHOCARDIOGRAM (TEE);  Surgeon: Thayer Headings, MD;  Location: [place];  Service: Cardiovascular;  Laterality: N/A;  . TEE WITHOUT CARDIOVERSION N/A 07/24/2014   Procedure: TRANSESOPHAGEAL ECHOCARDIOGRAM (TEE);  Surgeon: Melrose Nakayama, MD;  Location: [place];  Service: Open Heart Surgery;  Laterality: N/A;   Family History  Problem Relation Age of Onset  . Hypertension Mother    Social History   Socioeconomic History  . Marital status: Single    Spouse name: None  . Number of children: None  . Years of education: None  . Highest education level: None  Social Needs  . Financial resource strain: None  . Food insecurity - worry: None  . Food insecurity - inability: None  . Transportation needs - medical: None  . Transportation needs - non-medical: None  Occupational History  . None  Tobacco Use  . Smoking status: Never Smoker  . Smokeless tobacco: Never Used  Substance and Sexual Activity  . Alcohol use: No  . Drug use: No  . Sexual activity: No  Other Topics Concern  . None  Social History  Narrative  . None   Depression screen [facility] 2/9 07/04/2017 06/30/2017 06/28/2017 06/22/2016 07/28/2015  Decreased Interest 0 0 0 0 0  Down, Depressed, Hopeless 0 0 0 0 0  PHQ - 2 Score 0 0 0 0 0    Review of Systems  Constitutional: Negative for chills and fever.  HENT: Positive for congestion, postnasal drip, rhinorrhea and sinus pressure. Negative for dental problem and sore throat (resolved).   Eyes: Negative for visual disturbance.  Respiratory: Positive for cough and wheezing (minimal). Negative for shortness of breath.   Cardiovascular: Negative for chest pain.      Objective:   Physical Exam  Constitutional: He is oriented to person, place, and time. He appears well-developed and well-nourished. No distress.  HENT:  Head: Normocephalic and atraumatic.  Right Ear: A middle ear effusion (small) is present.  Nose: Mucosal edema present.  Mouth/Throat: Posterior oropharyngeal edema  and posterior oropharyngeal erythema present.  Fibers in L TM Nares: erythema and dry  Eyes: Conjunctivae and EOM are normal.  Neck: Neck supple. No tracheal deviation present.  Cardiovascular: Normal rate and regular rhythm.  Murmur heard.  Systolic (ejection) murmur is present with a grade of 1/6. Pulmonary/Chest: Effort normal and breath sounds normal. No respiratory distress.  Musculoskeletal: Normal range of motion.  Neurological: He is alert and oriented to person, place, and time.  Skin: Skin is warm and dry.  Psychiatric: He has a normal mood and affect. His behavior is normal.  Nursing note and vitals reviewed.  BP (!) 150/94 (BP Location: Left Arm, Patient Position: Sitting, Cuff Size: Large)   Pulse 85   Temp 97.8 F (36.6 C) (Oral)   Resp 16   Ht 5' 5.75" (1.67 m)   Wt 262 lb (118.8 kg)   SpO2 98%   BMI 42.61 kg/m     Results for orders placed or performed in visit on 07/04/17  CBC with Differential/Platelet  Result Value Ref Range   WBC 11.0 (H) 3.4 - 10.8 x10E3/uL   RBC 5.55  4.14 - 5.80 x10E6/uL   Hemoglobin 17.1 13.0 - 17.7 g/dL   Hematocrit 49.6 37.5 - 51.0 %   MCV 89 79 - 97 fL   MCH 30.8 26.6 - 33.0 pg   MCHC 34.5 31.5 - 35.7 g/dL   RDW 13.4 12.3 - 15.4 %   Platelets 311 150 - 379 x10E3/uL   Neutrophils 77 Not Estab. %   Lymphs 10 Not Estab. %   Monocytes 9 Not Estab. %   Eos 2 Not Estab. %   Basos 1 Not Estab. %   Neutrophils Absolute 8.5 (H) 1.4 - 7.0 x10E3/uL   Lymphocytes Absolute 1.1 0.7 - 3.1 x10E3/uL   Monocytes Absolute 1.0 (H) 0.1 - 0.9 x10E3/uL   EOS (ABSOLUTE) 0.2 0.0 - 0.4 x10E3/uL   Basophils Absolute 0.1 0.0 - 0.2 x10E3/uL   Immature Granulocytes 1 Not Estab. %   Immature Grans (Abs) 0.1 0.0 - 0.1 x10E3/uL  POCT glycosylated hemoglobin (Hb A1C)  Result Value Ref Range   Hemoglobin A1C 5.7   POCT glucose (manual entry)  Result Value Ref Range   POC Glucose 96 70 - 99 mg/dl    Assessment & Plan:   1. Foreign body of left ear, initial encounter - fibers from his job - removed partially by CMA water lavage after colace and remenant removed by myself with curette - pt tolerate procedure well, no comp. TM nml after.  2. Chronic maxillary sinusitis - start prednisone and flonase, cont augmentin. If worsening or no improvementin 48-72 hrs, can d/c augmentin and switch to doxy.  Pt requests azithro or levaquin but both of these interact with his amiodarone and give potential for QT prolongation so declined. Sulfa allergy. Consider continuing on flonase long-term due to c/o sneezing, throat tickling around work chemicals.   3. Elevated blood sugar - screen for DM prior to prednisone use - advise decreasing carbs.  4. Essential hypertension - likely due to otc cough/cold meds he has been using. meds rx'd by cardiology. rec have checked at work when feeling better and RTC if >140/90   Pt does not have PCP - follows with cardiology, gets labs done annually at work - has work NP - and then comes here when he needs anything else  Orders Placed  This Encounter  Procedures  . CBC with Differential/Platelet  . POCT glycosylated hemoglobin (Hb  A1C)  . POCT glucose (manual entry)    Meds ordered this encounter  Medications  . DISCONTD: azithromycin (ZITHROMAX) 250 MG tablet    Sig: Take 2 tabs PO x 1 dose, then 1 tab PO QD x 4 days    Dispense:  6 tablet    Refill:  0  . DISCONTD: predniSONE (DELTASONE) 10 MG tablet    Sig: 6-5-4-3-2-1 tabs po qd    Dispense:  21 tablet    Refill:  0  . predniSONE (DELTASONE) 20 MG tablet    Sig: Take 3 tabs qd x 3d, then 2 tabs qd x 3d then 1 tab qd x 3d.    Dispense:  18 tablet    Refill:  0    PLEASE D/C PRIOR RXS FOR AZITHROMYCIN (due to amiodarone interaction) and PREDNISONE 10 sent by myself within the prior hour  . doxycycline (VIBRA-TABS) 100 MG tablet    Sig: Take 1 tablet (100 mg total) by mouth 2 (two) times daily.    Dispense:  14 tablet    Refill:  0  . fluticasone (FLONASE) 50 MCG/ACT nasal spray    Sig: Place 2 sprays into both nostrils at bedtime.    Dispense:  16 g    Refill:  0    I personally performed the services described in this documentation, which was scribed in my presence. The recorded information has been reviewed and considered, and addended by me as needed.   Delman Cheadle, M.D.  Primary Care at [facility] [address] [place], [place] [address] [phone] phone [phone] fax  07/06/17 1:29 AM

## 2017-07-04 NOTE — Patient Instructions (Addendum)
Start the prednisone, continue the amox-clav. Can change from amox to doxy - the combo can be hard on your stomach so make sure to eat with the medication and take otc stomach acid meds as needed.  Use the flonase every night for 2 weeks at least to prevent this from coming back.   IF you received an x-ray today, you will receive an invoice from [facility]. Please contact [facility] at [phone] with questions or concerns regarding your invoice.   IF you received labwork today, you will receive an invoice from [place]. Please contact [facility] at [phone] with questions or concerns regarding your invoice.   Our billing staff will not be able to assist you with questions regarding bills from these companies.  You will be contacted with the lab results as soon as they are available. The fastest way to get your results is to activate your My Chart account. Instructions are located on the last page of this paperwork. If you have not heard from [place] regarding the results in 2 weeks, please contact this office.     Sinusitis, Adult Sinusitis is soreness and inflammation of your sinuses. Sinuses are hollow spaces in the bones around your face. Your sinuses are located:  Around your eyes.  In the middle of your forehead.  Behind your nose.  In your cheekbones.  Your sinuses and nasal passages are lined with a stringy fluid (mucus). Mucus normally drains out of your sinuses. When your nasal tissues become inflamed or swollen, the mucus can become trapped or blocked so air cannot flow through your sinuses. This allows bacteria, viruses, and funguses to grow, which leads to infection. Sinusitis can develop quickly and last for 7?10 days (acute) or for more than 12 weeks (chronic). Sinusitis often develops after a cold. What are the causes? This condition is caused by anything that creates swelling in the sinuses or stops mucus from draining,  including:  Allergies.  Asthma.  Bacterial or viral infection.  Abnormally shaped bones between the nasal passages.  Nasal growths that contain mucus (nasal polyps).  Narrow sinus openings.  Pollutants, such as chemicals or irritants in the air.  A foreign object stuck in the nose.  A fungal infection. This is rare.  What increases the risk? The following factors may make you more likely to develop this condition:  Having allergies or asthma.  Having had a recent cold or respiratory tract infection.  Having structural deformities or blockages in your nose or sinuses.  Having a weak immune system.  Doing a lot of swimming or diving.  Overusing nasal sprays.  Smoking.  What are the signs or symptoms? The main symptoms of this condition are pain and a feeling of pressure around the affected sinuses. Other symptoms include:  Upper toothache.  Earache.  Headache.  Bad breath.  Decreased sense of smell and taste.  A cough that may get worse at night.  Fatigue.  Fever.  Thick drainage from your nose. The drainage is often green and it may contain pus (purulent).  Stuffy nose or congestion.  Postnasal drip. This is when extra mucus collects in the throat or back of the nose.  Swelling and warmth over the affected sinuses.  Sore throat.  Sensitivity to light.  How is this diagnosed? This condition is diagnosed based on symptoms, a medical history, and a physical exam. To find out if your condition is acute or chronic, your health care provider may:  Look in your nose for signs  of nasal polyps.  Tap over the affected sinus to check for signs of infection.  View the inside of your sinuses using an imaging device that has a light attached (endoscope).  If your health care provider suspects that you have chronic sinusitis, you may also:  Be tested for allergies.  Have a sample of mucus taken from your nose (nasal culture) and checked for  bacteria.  Have a mucus sample examined to see if your sinusitis is related to an allergy.  If your sinusitis does not respond to treatment and it lasts longer than 8 weeks, you may have an MRI or CT scan to check your sinuses. These scans also help to determine how severe your infection is. In rare cases, a bone biopsy may be done to rule out more serious types of fungal sinus disease. How is this treated? Treatment for sinusitis depends on the cause and whether your condition is chronic or acute. If a virus is causing your sinusitis, your symptoms will go away on their own within 10 days. You may be given medicines to relieve your symptoms, including:  Topical nasal decongestants. They shrink swollen nasal passages and let mucus drain from your sinuses.  Antihistamines. These drugs block inflammation that is triggered by allergies. This can help to ease swelling in your nose and sinuses.  Topical nasal corticosteroids. These are nasal sprays that ease inflammation and swelling in your nose and sinuses.  Nasal saline washes. These rinses can help to get rid of thick mucus in your nose.  If your condition is caused by bacteria, you will be given an antibiotic medicine. If your condition is caused by a fungus, you will be given an antifungal medicine. Surgery may be needed to correct underlying conditions, such as narrow nasal passages. Surgery may also be needed to remove polyps. Follow these instructions at home: Medicines  Take, use, or apply over-the-counter and prescription medicines only as told by your health care provider. These may include nasal sprays.  If you were prescribed an antibiotic medicine, take it as told by your health care provider. Do not stop taking the antibiotic even if you start to feel better. Hydrate and Humidify  Drink enough water to keep your urine clear or pale yellow. Staying hydrated will help to thin your mucus.  Use a cool mist humidifier to keep the  humidity level in your home above 50%.  Inhale steam for 10-15 minutes, 3-4 times a day or as told by your health care provider. You can do this in the bathroom while a hot shower is running.  Limit your exposure to cool or dry air. Rest  Rest as much as possible.  Sleep with your head raised (elevated).  Make sure to get enough sleep each night. General instructions  Apply a warm, moist washcloth to your face 3-4 times a day or as told by your health care provider. This will help with discomfort.  Wash your hands often with soap and water to reduce your exposure to viruses and other germs. If soap and water are not available, use hand sanitizer.  Do not smoke. Avoid being around people who are smoking (secondhand smoke).  Keep all follow-up visits as told by your health care provider. This is important. Contact a health care provider if:  You have a fever.  Your symptoms get worse.  Your symptoms do not improve within 10 days. Get help right away if:  You have a severe headache.  You have persistent vomiting.  You have pain or swelling around your face or eyes.  You have vision problems.  You develop confusion.  Your neck is stiff.  You have trouble breathing. This information is not intended to replace advice given to you by your health care provider. Make sure you discuss any questions you have with your health care provider. Document Released: 05/31/2005 Document Revised: 01/25/2016 Document Reviewed: 03/26/2015 Elsevier Interactive Patient Education  [employer].

## 2017-07-05 LAB — CBC WITH DIFFERENTIAL/PLATELET
BASOS: 1 %
Basophils Absolute: 0.1 10*3/uL (ref 0.0–0.2)
EOS (ABSOLUTE): 0.2 10*3/uL (ref 0.0–0.4)
EOS: 2 %
HEMATOCRIT: 49.6 % (ref 37.5–51.0)
HEMOGLOBIN: 17.1 g/dL (ref 13.0–17.7)
IMMATURE GRANULOCYTES: 1 %
Immature Grans (Abs): 0.1 10*3/uL (ref 0.0–0.1)
Lymphocytes Absolute: 1.1 10*3/uL (ref 0.7–3.1)
Lymphs: 10 %
MCH: 30.8 pg (ref 26.6–33.0)
MCHC: 34.5 g/dL (ref 31.5–35.7)
MCV: 89 fL (ref 79–97)
MONOCYTES: 9 %
Monocytes Absolute: 1 10*3/uL — ABNORMAL HIGH (ref 0.1–0.9)
NEUTROS PCT: 77 %
Neutrophils Absolute: 8.5 10*3/uL — ABNORMAL HIGH (ref 1.4–7.0)
Platelets: 311 10*3/uL (ref 150–379)
RBC: 5.55 x10E6/uL (ref 4.14–5.80)
RDW: 13.4 % (ref 12.3–15.4)
WBC: 11 10*3/uL — AB (ref 3.4–10.8)

## 2017-07-29 ENCOUNTER — Other Ambulatory Visit: Payer: Self-pay

## 2017-07-29 MED ORDER — FLUTICASONE PROPIONATE 50 MCG/ACT NA SUSP: 2.0000 | Freq: Every day | NASAL | 0 refills | Status: DC

## 2017-08-14 ENCOUNTER — Other Ambulatory Visit: Payer: Self-pay | Admitting: Cardiovascular Disease

## 2017-08-26 ENCOUNTER — Other Ambulatory Visit: Payer: Self-pay | Admitting: Family Medicine

## 2017-12-20 ENCOUNTER — Encounter: Payer: Self-pay | Admitting: Cardiovascular Disease

## 2017-12-20 ENCOUNTER — Ambulatory Visit: Payer: BLUE CROSS/BLUE SHIELD | Admitting: Cardiovascular Disease

## 2017-12-20 DIAGNOSIS — I2510 Atherosclerotic heart disease of native coronary artery without angina pectoris: Secondary | ICD-10-CM | POA: Diagnosis not present

## 2017-12-20 DIAGNOSIS — I480 Paroxysmal atrial fibrillation: Secondary | ICD-10-CM

## 2017-12-20 DIAGNOSIS — I351 Nonrheumatic aortic (valve) insufficiency: Secondary | ICD-10-CM

## 2017-12-20 DIAGNOSIS — E7800 Pure hypercholesterolemia, unspecified: Secondary | ICD-10-CM | POA: Diagnosis not present

## 2017-12-20 DIAGNOSIS — I5022 Chronic systolic (congestive) heart failure: Secondary | ICD-10-CM

## 2017-12-20 DIAGNOSIS — I10 Essential (primary) hypertension: Secondary | ICD-10-CM

## 2017-12-20 MED ORDER — APIXABAN 5 MG PO TABS: 5.0000 mg | ORAL_TABLET | Freq: Two times a day (BID) | ORAL | 6 refills | Status: DC

## 2017-12-20 NOTE — Assessment & Plan Note (Signed)
History of chronic systolic heart failure with EF in the 40% range by 2D echo performed 03/16/2017.  He is relatively asymptomatic from this.

## 2017-12-20 NOTE — Progress Notes (Signed)
12/20/2017 Donald Escobar   March 24, 1956  [id number]  Primary Physician Patient, No Pcp Per Primary Cardiologist: [name] MD FACP, [place], [place], [place]  HPI:  Donald Escobar is a 62 y.o.  mildly overweight single Caucasian male with no children who works in the [employer] I last saw him in the office 03/18/2017.[name] He was admitted with congestive heart failure on 10/29/15.[name] He ruled out for myocardial infarction. A 2-D echocardiogram revealed moderately severe LV dysfunction with a globally dilated LV and EF of 25%. He also had moderate to severe aortic insufficiency. He underwent right and left heart cardiac catheterization by myself on 07/15/14 revealing LAD diagonal branch disease, moderate LV dysfunction with moderate to severe aortic insufficiency. 10 days later he underwent coronary artery bypass grafting with a LIMA to his LAD, vein to diagonal branch and a bovine pericardial aortic valve replacement. He was discharged home on amiodarone, Coumadin anticoagulation. He saw [name] registered nurse practitioner in the office on 09/09/14 which time he was in sinus rhythm. He never participated in cardiac rehabilitation. He remains in sinus rhythm today. I last saw him approximately a year and a half ago. Recent 2-D echo performed several days ago revealed an EF of 40-45% with a normal functioning aortic bovine pericardial prosthesis and normal LV size. He denies chest pain or shortness of breath.  Since I saw him 10 months ago he is done relatively well.  He denies chest pain or shortness of breath.  He was initially on Coumadin and oral antiregulation which was discontinued because of failure to show up for Coumadin checks and switch to Xarelto which he stopped 6 months ago because of intermittent epistaxis.     Current Meds  Medication Sig  . amiodarone (PACERONE) 100 MG tablet Take 1 tablet (100 mg total) by mouth daily.  [name] aspirin EC 81 MG EC tablet Take 1 tablet (81 mg total) by  mouth daily.  . carvedilol (COREG) 12.5 MG tablet Take 1 tablet (12.5 mg total) by mouth 2 (two) times daily with a meal.  . furosemide (LASIX) 40 MG tablet TAKE 1 TABLET BY MOUTH EVERY DAY  . levothyroxine (SYNTHROID, LEVOTHROID) 50 MCG tablet TAKE 1 TABLET BY MOUTH EVERY DAY ON AN EMPTY STOMACH  . rivaroxaban (XARELTO) 20 MG TABS tablet Take 1 tablet (20 mg total) by mouth daily with supper.  . [DISCONTINUED] benzonatate (TESSALON) 100 MG capsule Take 1 capsule (100 mg total) by mouth 3 (three) times daily as needed for cough.  . [DISCONTINUED] doxycycline (VIBRA-TABS) 100 MG tablet Take 1 tablet (100 mg total) by mouth 2 (two) times daily.  . [DISCONTINUED] fluticasone (FLONASE) 50 MCG/ACT nasal spray PLACE 2 SPRAYS INTO BOTH NOSTRILS AT BEDTIME.  . [DISCONTINUED] HYDROcodone-homatropine (HYCODAN) 5-1.5 MG/5ML syrup 60m by mouth a bedtime as needed for cough.  . [DISCONTINUED] predniSONE (DELTASONE) 20 MG tablet Take 3 tabs qd x 3d, then 2 tabs qd x 3d then 1 tab qd x 3d.     Allergies  Allergen Reactions  . Sulfa Antibiotics Swelling    Lip swelling    Social History   Socioeconomic History  . Marital status: Single    Spouse name: Not on file  . Number of children: Not on file  . Years of education: Not on file  . Highest education level: Not on file  Occupational History  . Not on file  Social Needs  . Financial resource strain: Not on file  . Food  insecurity:    Worry: Not on file    Inability: Not on file  . Transportation needs:    Medical: Not on file    Non-medical: Not on file  Tobacco Use  . Smoking status: Never Smoker  . Smokeless tobacco: Never Used  Substance and Sexual Activity  . Alcohol use: No  . Drug use: No  . Sexual activity: Never  Lifestyle  . Physical activity:    Days per week: Not on file    Minutes per session: Not on file  . Stress: Not on file  Relationships  . Social connections:    Talks on phone: Not on file    Gets together: Not on  file    Attends religious service: Not on file    Active member of club or organization: Not on file    Attends meetings of clubs or organizations: Not on file    Relationship status: Not on file  . Intimate partner violence:    Fear of current or ex partner: Not on file    Emotionally abused: Not on file    Physically abused: Not on file    Forced sexual activity: Not on file  Other Topics Concern  . Not on file  Social History Narrative  . Not on file     Review of Systems: General: negative for chills, fever, night sweats or weight changes.  Cardiovascular: negative for chest pain, dyspnea on exertion, edema, orthopnea, palpitations, paroxysmal nocturnal dyspnea or shortness of breath Dermatological: negative for rash Respiratory: negative for cough or wheezing Urologic: negative for hematuria Abdominal: negative for nausea, vomiting, diarrhea, bright red blood per rectum, melena, or hematemesis Neurologic: negative for visual changes, syncope, or dizziness All other systems reviewed and are otherwise negative except as noted above.    Blood pressure 125/82, pulse 61, height 5\' 5"  (1.651 m), weight 262 lb (118.8 kg).  General appearance: alert and no distress Neck: no adenopathy, no carotid bruit, no JVD, supple, symmetrical, trachea midline and thyroid not enlarged, symmetric, no tenderness/mass/nodules Lungs: clear to auscultation bilaterally Heart: 2/6 systolic ejection murmur at the base consistent with aortic stenosis. Extremities: extremities normal, atraumatic, no cyanosis or edema Pulses: 2+ and symmetric Skin: Skin color, texture, turgor normal. No rashes or lesions Neurologic: Alert and oriented X 3, normal strength and tone. Normal symmetric reflexes. Normal coordination and gait  EKG sinus rhythm at 61 with left bundle branch block.  I personally reviewed this EKG.  ASSESSMENT AND PLAN:   Aortic insufficiency History of moderate to severe aortic insufficiency  status post bovine pericardial valve replacement February 2016 which we have been following by 2D echocardiography.  His last echo performed 03/16/2017 revealed ejection fraction of 40 to 45% with a peak gradient of 27 mmHg and no aortic insufficiency.  We will recheck a 2D echocardiogram in October.  Unfortunately, he was on Coumadin initially but did not show up for Coumadin checks and was changed to Xarelto which he stopped 6 months ago because of intermittent epistaxis.  Hyperlipemia History of hyperlipidemia intolerant to statin therapy with lipid profile performed 12/08/2017 revealing total cholesterol of 52, LDL 99 and HDL of 34.  We will explore beginning [place].  CAD (coronary artery disease) History of CAD status post cardiac catheterization after non-STEMI 07/15/2014 revealing LAD/diagonal branch disease with moderate LV dysfunction.  He underwent coronary artery bypass grafting x2 with a LIMA to his LAD and a vein to diagonal branch.  He denies chest pain.  Hypertension  Essential hypertension with blood pressure measured today at 125/82.  He is on carvedilol.  Continue current meds at current dosing.  Atrial fibrillation [I48.91] History of paroxysmal atrial fibrillation maintaining sinus rhythm on low-dose amiodarone and Xarelto oral anticoagulation which he stopped 6 months ago on his own because of intermittent epistaxis.  Chronic systolic heart failure (HCC) History of chronic systolic heart failure with EF in the 40% range by 2D echo performed 03/16/2017.  He is relatively asymptomatic from this.      [name] MD FACP,FACC,FAHA, [facility] 12/20/2017 10:46 AM

## 2017-12-20 NOTE — Assessment & Plan Note (Signed)
History of hyperlipidemia intolerant to statin therapy with lipid profile performed 12/08/2017 revealing total cholesterol of 52, LDL 99 and HDL of 34.  We will explore beginning Donald Escobar.

## 2017-12-20 NOTE — Patient Instructions (Addendum)
Medication Instructions:   START ELIQUIS 5 MG TWICE DAILY  Testing/Procedures:  Your physician has requested that you have an echocardiogram. Echocardiography is a painless test that uses sound waves to create images of your heart. It provides your doctor with information about the size and shape of your heart and how well your heart's chambers and valves are working. This procedure takes approximately one hour. There are no restrictions for this procedure. SCHEDULE IN OCTOBER    Follow-Up:  Your physician wants you to follow-up in: [place] will receive a reminder letter in the mail two months in advance. If you don't receive a letter, please call our office to schedule the follow-up appointment.   If you need a refill on your cardiac medications before your next appointment, please call your pharmacy.

## 2017-12-20 NOTE — Assessment & Plan Note (Addendum)
History of moderate to severe aortic insufficiency status post bovine pericardial valve replacement February 2016 which we have been following by 2D echocardiography.  His last echo performed 03/16/2017 revealed ejection fraction of 40 to 45% with a peak gradient of 27 mmHg and no aortic insufficiency.  We will recheck a 2D echocardiogram in October.  Unfortunately, he was on Coumadin initially but did not show up for Coumadin checks and was changed to Xarelto which he stopped 6 months ago because of intermittent epistaxis.

## 2017-12-20 NOTE — Assessment & Plan Note (Signed)
History of paroxysmal atrial fibrillation maintaining sinus rhythm on low-dose amiodarone and Xarelto oral anticoagulation which he stopped 6 months ago on his own because of intermittent epistaxis.

## 2017-12-20 NOTE — Assessment & Plan Note (Signed)
History of CAD status post cardiac catheterization after non-STEMI 07/15/2014 revealing LAD/diagonal branch disease with moderate LV dysfunction.  He underwent coronary artery bypass grafting x2 with a LIMA to his LAD and a vein to diagonal branch.  He denies chest pain.

## 2017-12-20 NOTE — Assessment & Plan Note (Signed)
Essential hypertension with blood pressure measured today at 125/82.  He is on carvedilol.  Continue current meds at current dosing.

## 2018-02-11 ENCOUNTER — Other Ambulatory Visit: Payer: Self-pay | Admitting: Cardiovascular Disease

## 2018-02-14 NOTE — Telephone Encounter (Signed)
Rx sent to pharmacy   

## 2018-03-20 ENCOUNTER — Other Ambulatory Visit (HOSPITAL_COMMUNITY): Payer: BLUE CROSS/BLUE SHIELD

## 2018-03-21 ENCOUNTER — Ambulatory Visit (HOSPITAL_COMMUNITY): Payer: BLUE CROSS/BLUE SHIELD | Attending: Cardiovascular Disease

## 2018-03-21 DIAGNOSIS — R0989 Other specified symptoms and signs involving the circulatory and respiratory systems: Secondary | ICD-10-CM

## 2018-03-28 ENCOUNTER — Other Ambulatory Visit: Payer: Self-pay | Admitting: Cardiovascular Disease

## 2018-05-09 ENCOUNTER — Other Ambulatory Visit: Payer: Self-pay | Admitting: Cardiovascular Disease

## 2018-05-09 NOTE — Telephone Encounter (Signed)
Rx request sent to pharmacy.  

## 2018-05-17 ENCOUNTER — Other Ambulatory Visit: Payer: Self-pay | Admitting: Cardiovascular Disease

## 2018-05-17 NOTE — Telephone Encounter (Signed)
Rx request sent to pharmacy.  

## 2018-07-23 ENCOUNTER — Other Ambulatory Visit: Payer: Self-pay | Admitting: Cardiovascular Disease

## 2018-08-05 ENCOUNTER — Other Ambulatory Visit: Payer: Self-pay | Admitting: Cardiovascular Disease

## 2018-11-11 ENCOUNTER — Other Ambulatory Visit: Payer: Self-pay | Admitting: Cardiovascular Disease

## 2018-11-21 ENCOUNTER — Telehealth: Payer: Self-pay | Admitting: *Deleted

## 2018-11-21 NOTE — Telephone Encounter (Signed)
A message left,re: follow up visit. 

## 2018-12-22 ENCOUNTER — Telehealth: Payer: Self-pay

## 2018-12-22 NOTE — Telephone Encounter (Signed)
LMTCB regarding converting 7/17 virtual visit to OV

## 2018-12-28 ENCOUNTER — Telehealth: Payer: Self-pay | Admitting: Cardiovascular Disease

## 2018-12-28 NOTE — Telephone Encounter (Signed)
New Message ° ° ° °Left message to confirm appt and answer covid questions  °

## 2018-12-29 ENCOUNTER — Encounter: Payer: Self-pay | Admitting: Cardiovascular Disease

## 2018-12-29 ENCOUNTER — Telehealth: Payer: Self-pay | Admitting: Pharmacist

## 2018-12-29 ENCOUNTER — Ambulatory Visit: Payer: BC Managed Care – PPO | Admitting: Cardiovascular Disease

## 2018-12-29 ENCOUNTER — Other Ambulatory Visit: Payer: Self-pay

## 2018-12-29 ENCOUNTER — Encounter (INDEPENDENT_AMBULATORY_CARE_PROVIDER_SITE_OTHER): Payer: Self-pay

## 2018-12-29 DIAGNOSIS — I10 Essential (primary) hypertension: Secondary | ICD-10-CM | POA: Diagnosis not present

## 2018-12-29 DIAGNOSIS — I351 Nonrheumatic aortic (valve) insufficiency: Secondary | ICD-10-CM | POA: Diagnosis not present

## 2018-12-29 DIAGNOSIS — I480 Paroxysmal atrial fibrillation: Secondary | ICD-10-CM

## 2018-12-29 DIAGNOSIS — I2510 Atherosclerotic heart disease of native coronary artery without angina pectoris: Secondary | ICD-10-CM | POA: Diagnosis not present

## 2018-12-29 DIAGNOSIS — E782 Mixed hyperlipidemia: Secondary | ICD-10-CM | POA: Diagnosis not present

## 2018-12-29 DIAGNOSIS — I5022 Chronic systolic (congestive) heart failure: Secondary | ICD-10-CM

## 2018-12-29 MED ORDER — LISINOPRIL 5 MG PO TABS: 5.0000 mg | ORAL_TABLET | Freq: Every day | ORAL | 3 refills | Status: DC

## 2018-12-29 NOTE — Assessment & Plan Note (Signed)
History of chronic systolic heart failure with ejection fraction measured by 2D echo 03/16/2017 of 40 to 45%, significantly improved from 25% pre-bypass and AVR.  He is on a diuretic and does not have symptoms of heart failure at this time.

## 2018-12-29 NOTE — Assessment & Plan Note (Signed)
History of essential hypertension with blood pressure measured today 145/91.  He is on carvedilol although he has not taken his medicines today.

## 2018-12-29 NOTE — Assessment & Plan Note (Signed)
BMI equals 45

## 2018-12-29 NOTE — Progress Notes (Signed)
12/29/2018 Donald Escobar   1956-03-06  [id number]  Primary Physician Patient, No Pcp Per Primary Cardiologist: [name] MD FACP, [place], [place], [place]  HPI:  Donald Escobar is a 63 y.o.  mildly overweight single Caucasian male with no children who works in the [employer] I last saw him in the office 12/20/2017.[name] He was admitted with congestive heart failure on 10/29/15.[name] He ruled out for myocardial infarction. A 2-D echocardiogram revealed moderately severe LV dysfunction with a globally dilated LV and EF of 25%. He also had moderate to severe aortic insufficiency. He underwent right and left heart cardiac catheterization by myself on 07/15/14 revealing LAD diagonal branch disease, moderate LV dysfunction with moderate to severe aortic insufficiency. 10 days later he underwent coronary artery bypass grafting with a LIMA to his LAD, vein to diagonal branch and a bovine pericardial aortic valve replacement. He was discharged home on amiodarone, Coumadin anticoagulation. He saw [name] registered nurse practitioner in the office on 09/09/14 which time he was in sinus rhythm. He never participated in cardiac rehabilitation.He remains in sinus rhythm today. I last saw him approximately a year and a half ago. Recent 2-D echo performed several days ago revealed an EF of 40-45% with a normal functioning aortic bovine pericardial prosthesis and normal LV size. He denies chest pain or shortness of breath.  Since I saw him in the office 1 year ago he continues to do well.  He denies chest pain or shortness of breath.  He is not had a 2D echo for 2 years.  He has maintained sinus rhythm and currently is on Eliquis oral anticoagulation.  He is statin intolerant and we will explore Repatha therapy.   Current Meds  Medication Sig  . amiodarone (PACERONE) 100 MG tablet TAKE 1 TABLET BY MOUTH EVERY DAY  . aspirin EC 81 MG EC tablet Take 1 tablet (81 mg total) by mouth daily.  . carvedilol (COREG)  12.5 MG tablet TAKE 1 TABLET BY MOUTH TWICE A DAY WITH MEALS  . ELIQUIS 5 MG TABS tablet TAKE 1 TABLET BY MOUTH TWICE A DAY  . furosemide (LASIX) 40 MG tablet TAKE 1 TABLET BY MOUTH EVERY DAY  . levothyroxine (SYNTHROID, LEVOTHROID) 50 MCG tablet TAKE 1 TABLET BY MOUTH EVERY DAY ON AN EMPTY STOMACH  . [DISCONTINUED] rivaroxaban (XARELTO) 20 MG TABS tablet Take 1 tablet (20 mg total) by mouth daily with supper.     Allergies  Allergen Reactions  . Sulfa Antibiotics Swelling    Lip swelling    Social History   Socioeconomic History  . Marital status: Single    Spouse name: Not on file  . Number of children: Not on file  . Years of education: Not on file  . Highest education level: Not on file  Occupational History  . Not on file  Social Needs  . Financial resource strain: Not on file  . Food insecurity    Worry: Not on file    Inability: Not on file  . Transportation needs    Medical: Not on file    Non-medical: Not on file  Tobacco Use  . Smoking status: Never Smoker  . Smokeless tobacco: Never Used  Substance and Sexual Activity  . Alcohol use: No  . Drug use: No  . Sexual activity: Never  Lifestyle  . Physical activity    Days per week: Not on file    Minutes per session: Not on file  . Stress:  Not on file  Relationships  . Social Herbalist on phone: Not on file    Gets together: Not on file    Attends religious service: Not on file    Active member of club or organization: Not on file    Attends meetings of clubs or organizations: Not on file    Relationship status: Not on file  . Intimate partner violence    Fear of current or ex partner: Not on file    Emotionally abused: Not on file    Physically abused: Not on file    Forced sexual activity: Not on file  Other Topics Concern  . Not on file  Social History Narrative  . Not on file     Review of Systems: General: negative for chills, fever, night sweats or weight changes.  Cardiovascular:  negative for chest pain, dyspnea on exertion, edema, orthopnea, palpitations, paroxysmal nocturnal dyspnea or shortness of breath Dermatological: negative for rash Respiratory: negative for cough or wheezing Urologic: negative for hematuria Abdominal: negative for nausea, vomiting, diarrhea, bright red blood per rectum, melena, or hematemesis Neurologic: negative for visual changes, syncope, or dizziness All other systems reviewed and are otherwise negative except as noted above.    Blood pressure (!) 145/91, pulse 66, temperature (!) 96.4 F (35.8 C), height 5\' 5"  (1.651 m), weight 270 lb 12.8 oz (122.8 kg), SpO2 96 %.  General appearance: alert and no distress Neck: no adenopathy, no carotid bruit, no JVD, supple, symmetrical, trachea midline and thyroid not enlarged, symmetric, no tenderness/mass/nodules Lungs: clear to auscultation bilaterally Heart: Soft outflow tract murmur at the base consistent with aortic stenosis. Extremities: extremities normal, atraumatic, no cyanosis or edema Pulses: 2+ and symmetric Skin: Skin color, texture, turgor normal. No rashes or lesions Neurologic: Alert and oriented X 3, normal strength and tone. Normal symmetric reflexes. Normal coordination and gait  EKG sinus bradycardia 57 with septal Q waves and nonspecific ST and T wave changes.  I personally reviewed this EKG.  ASSESSMENT AND PLAN:   Aortic insufficiency History of severe aortic insufficiency status post CABG x2 with a pericardial aortic valve replacement.  His last echo performed 2 years ago revealed I will a well-functioning aortic bioprosthesis with improved LV function.  He denies chest pain or shortness of breath.  Morbid obesity (HCC) BMI equals 45  Hyperlipemia History of hyperlipidemia intolerant to statin therapy.  We will recheck a lipid liver profile and explore using Repatha.  CAD (coronary artery disease) History of CAD status post CABG x2 after cardiac catheterization  performed by myself 07/15/2014 revealing LAD diagonal branch bifurcation disease and severe LV dysfunction with an EF of 25%.  He had a LIMA to his LAD and a vein to diagonal branch.  Hypertension History of essential hypertension with blood pressure measured today 145/91.  He is on carvedilol although he has not taken his medicines today.  Atrial fibrillation [I48.91] History of PAF maintaining sinus rhythm on low-dose amiodarone and Eliquis oral anticoagulation  Chronic systolic heart failure (HCC) History of chronic systolic heart failure with ejection fraction measured by 2D echo 03/16/2017 of 40 to 45%, significantly improved from 25% pre-bypass and AVR.  He is on a diuretic and does not have symptoms of heart failure at this time.      [name] MD FACP,FACC,FAHA, [facility] 12/29/2018 11:23 AM

## 2018-12-29 NOTE — Patient Instructions (Signed)
Medication Instructions:  Your physician has recommended you make the following change in your medication:   START LISINOPRIL, 5 MG BY MOUTH DAILY  If you need a refill on your cardiac medications before your next appointment, please call your pharmacy.   Lab work: Your physician recommends that you return for lab work TODAY: LIPID AND LIVER PANELS  Your physician recommends that you return for lab work IN:  7-10 DAYS: BMET  If you have labs (blood work) drawn today and your tests are completely normal, you will receive your results only by: [name] MyChart Message (if you have MyChart) OR . A paper copy in the mail If you have any lab test that is abnormal or we need to change your treatment, we will call you to review the results.  Testing/Procedures: Your physician has requested that you have an echocardiogram. Echocardiography is a painless test that uses sound waves to create images of your heart. It provides your doctor with information about the size and shape of your heart and how well your heart's chambers and valves are working. This procedure takes approximately one hour. There are no restrictions for this procedure. LOCATION: [place], [address], [place] [address]   Follow-Up: At [facility], you and your health needs are our priority.  As part of our continuing mission to provide you with exceptional heart care, we have created designated Provider Care Teams.  These Care Teams include your primary Cardiologist (physician) and Advanced Practice Providers (APPs -  Physician Assistants and Nurse Practitioners) who all work together to provide you with the care you need, when you need it. You will need a follow up appointment in 12 months WITH DR. Gwenlyn Found.  Please call our office 2 months in advance to schedule this appointment.

## 2018-12-29 NOTE — Assessment & Plan Note (Signed)
History of PAF maintaining sinus rhythm on low-dose amiodarone and Eliquis oral anticoagulation. 

## 2018-12-29 NOTE — Assessment & Plan Note (Signed)
History of severe aortic insufficiency status post CABG x2 with a pericardial aortic valve replacement.  His last echo performed 2 years ago revealed I will a well-functioning aortic bioprosthesis with improved LV function.  He denies chest pain or shortness of breath.

## 2018-12-29 NOTE — Assessment & Plan Note (Signed)
History of hyperlipidemia intolerant to statin therapy.  We will recheck a lipid liver profile and explore using Repatha.

## 2018-12-29 NOTE — Assessment & Plan Note (Signed)
History of CAD status post CABG x2 after cardiac catheterization performed by myself 07/15/2014 revealing LAD diagonal branch bifurcation disease and severe LV dysfunction with an EF of 25%.  He had a LIMA to his LAD and a vein to diagonal branch.

## 2018-12-29 NOTE — Telephone Encounter (Signed)
Statin history:  Atorvastatin 40mg  daily  Rosuvastatin 5mg  3x/week  LABS: 12/29/2018: pending 12/08/2017: CHO 152; LDL 99; HDL 34 10/29/2015:CHO 195; LDL 137; HDL 36  Hyperlipemia History of hyperlipidemia intolerant to statin therapy with lipid profile performed 12/08/2017 revealing total cholesterol of 152, LDL 99 and HDL of 34.  We will explore beginning [place].  CAD (coronary artery disease) History of CAD status post cardiac catheterization after non-STEMI 07/15/2014 revealing LAD/diagonal branch disease with moderate LV dysfunction.  He underwent coronary artery bypass grafting x2 with a LIMA to his LAD and a vein to diagonal branch.  He denies chest pain.   Repatha Sureclick 140mg  eery 14 days will be started. PA process, co-pay card , storage, administration and common side effects were discussed with patient.  Sample given for self-administration during office visit.  Drug name: Repatha      Strength: 140mg      Qty:1  LOT: [id number]  Exp.Date: 10/22   Delshon Blanchfield Rodriguez-Guzman PharmD, BCPS, [place] [address] [place] [address] 12/29/2018 1:17 PM

## 2018-12-30 LAB — LIPID PANEL
Chol/HDL Ratio: 4.5 ratio (ref 0.0–5.0)
Cholesterol, Total: 143 mg/dL (ref 100–199)
HDL: 32 mg/dL — ABNORMAL LOW (ref >39)
LDL Calculated: 94 mg/dL (ref 0–99)
Triglycerides: 87 mg/dL (ref 0–149)
VLDL Cholesterol Cal: 17 mg/dL (ref 5–40)

## 2018-12-30 LAB — HEPATIC FUNCTION PANEL
ALT: 18 IU/L (ref 0–44)
AST: 18 IU/L (ref 0–40)
Albumin: 4.4 g/dL (ref 3.8–4.8)
Alkaline Phosphatase: 72 IU/L (ref 39–117)
Bilirubin Total: 0.5 mg/dL (ref 0.0–1.2)
Bilirubin, Direct: 0.16 mg/dL (ref 0.00–0.40)
Total Protein: 6.5 g/dL (ref 6.0–8.5)

## 2019-01-03 ENCOUNTER — Telehealth (HOSPITAL_COMMUNITY): Payer: Self-pay | Admitting: Radiology

## 2019-01-03 NOTE — Telephone Encounter (Signed)
Called to schedule echocardiogram-unable to leave message.

## 2019-01-05 DIAGNOSIS — I10 Essential (primary) hypertension: Secondary | ICD-10-CM | POA: Diagnosis not present

## 2019-01-05 LAB — BASIC METABOLIC PANEL
BUN/Creatinine Ratio: 17 (ref 10–24)
BUN: 19 mg/dL (ref 8–27)
CO2: 22 mmol/L (ref 20–29)
Calcium: 8.8 mg/dL (ref 8.6–10.2)
Chloride: 101 mmol/L (ref 96–106)
Creatinine, Ser: 1.12 mg/dL (ref 0.76–1.27)
GFR calc Af Amer: 80 mL/min/{1.73_m2} (ref >59)
GFR calc non Af Amer: 69 mL/min/{1.73_m2} (ref >59)
Glucose: 103 mg/dL — ABNORMAL HIGH (ref 65–99)
Potassium: 4.5 mmol/L (ref 3.5–5.2)
Sodium: 138 mmol/L (ref 134–144)

## 2019-01-15 ENCOUNTER — Telehealth: Payer: Self-pay

## 2019-01-15 MED ORDER — REPATHA SURECLICK 140 MG/ML ~~LOC~~ SOAJ: 140.0000 mg | SUBCUTANEOUS | 6 refills | Status: DC

## 2019-01-15 NOTE — Telephone Encounter (Signed)
Called and lmomed the pt regarding the approval of repatha and notified pt that the rx was sent and instructed the pt to call us back if they have any questions

## 2019-01-18 ENCOUNTER — Telehealth: Payer: Self-pay | Admitting: *Deleted

## 2019-01-18 NOTE — Telephone Encounter (Signed)
The patient came in for a copy of his lab work and an eliquis coupon. Those has been provided.

## 2019-01-30 ENCOUNTER — Other Ambulatory Visit: Payer: Self-pay | Admitting: Cardiovascular Disease

## 2019-02-04 ENCOUNTER — Other Ambulatory Visit: Payer: Self-pay | Admitting: Cardiovascular Disease

## 2019-02-12 ENCOUNTER — Other Ambulatory Visit: Payer: Self-pay | Admitting: Cardiovascular Disease

## 2019-02-12 NOTE — Telephone Encounter (Signed)
Please review for refill. Thank you! 

## 2019-02-12 NOTE — Telephone Encounter (Signed)
15m 122.8kg Scr 1.12 01/05/19 Lovw/jon berry 12/20/17

## 2019-02-16 ENCOUNTER — Other Ambulatory Visit: Payer: Self-pay

## 2019-02-16 ENCOUNTER — Ambulatory Visit (HOSPITAL_COMMUNITY): Payer: BC Managed Care – PPO | Attending: Cardiovascular Disease

## 2019-02-16 DIAGNOSIS — I351 Nonrheumatic aortic (valve) insufficiency: Secondary | ICD-10-CM | POA: Insufficient documentation

## 2019-02-16 MED ORDER — PERFLUTREN LIPID MICROSPHERE
1.0000 mL | INTRAVENOUS | Status: AC | PRN
  Administered 2019-02-16: 3 mL via INTRAVENOUS

## 2019-03-09 ENCOUNTER — Other Ambulatory Visit: Payer: Self-pay | Admitting: *Deleted

## 2019-03-09 DIAGNOSIS — I351 Nonrheumatic aortic (valve) insufficiency: Secondary | ICD-10-CM

## 2019-03-16 ENCOUNTER — Other Ambulatory Visit: Payer: Self-pay | Admitting: Cardiovascular Disease

## 2019-03-29 ENCOUNTER — Other Ambulatory Visit: Payer: Self-pay

## 2019-03-29 DIAGNOSIS — Z20828 Contact with and (suspected) exposure to other viral communicable diseases: Secondary | ICD-10-CM | POA: Diagnosis not present

## 2019-03-29 DIAGNOSIS — Z20822 Contact with and (suspected) exposure to covid-19: Secondary | ICD-10-CM

## 2019-03-30 LAB — NOVEL CORONAVIRUS, NAA: SARS-CoV-2, NAA: NOT DETECTED

## 2019-06-05 ENCOUNTER — Other Ambulatory Visit: Payer: Self-pay | Admitting: Cardiovascular Disease

## 2019-08-03 ENCOUNTER — Other Ambulatory Visit: Payer: Self-pay | Admitting: Cardiovascular Disease

## 2019-08-27 ENCOUNTER — Other Ambulatory Visit: Payer: Self-pay | Admitting: Cardiovascular Disease

## 2019-10-18 ENCOUNTER — Other Ambulatory Visit: Payer: Self-pay | Admitting: Cardiovascular Disease

## 2019-10-25 ENCOUNTER — Other Ambulatory Visit: Payer: Self-pay | Admitting: Cardiovascular Disease

## 2019-11-25 ENCOUNTER — Other Ambulatory Visit: Payer: Self-pay | Admitting: Cardiovascular Disease

## 2019-12-20 ENCOUNTER — Other Ambulatory Visit: Payer: Self-pay | Admitting: Cardiovascular Disease

## 2020-02-24 ENCOUNTER — Other Ambulatory Visit: Payer: Self-pay | Admitting: Cardiovascular Disease

## 2020-03-07 ENCOUNTER — Ambulatory Visit (HOSPITAL_COMMUNITY): Payer: Self-pay | Attending: Cardiology

## 2020-03-07 ENCOUNTER — Other Ambulatory Visit: Payer: Self-pay

## 2020-03-07 DIAGNOSIS — I351 Nonrheumatic aortic (valve) insufficiency: Secondary | ICD-10-CM | POA: Insufficient documentation

## 2020-03-07 LAB — ECHOCARDIOGRAM COMPLETE
AV Mean grad: 21 mmHg
AV Peak grad: 28.5 mmHg
Ao pk vel: 2.67 m/s
S' Lateral: 3.2 cm

## 2020-03-07 MED ORDER — PERFLUTREN LIPID MICROSPHERE
1.0000 mL | INTRAVENOUS | Status: AC | PRN
  Administered 2020-03-07: 1 mL via INTRAVENOUS

## 2020-03-11 ENCOUNTER — Other Ambulatory Visit: Payer: Self-pay

## 2020-03-11 DIAGNOSIS — I351 Nonrheumatic aortic (valve) insufficiency: Secondary | ICD-10-CM

## 2020-03-11 DIAGNOSIS — Z952 Presence of prosthetic heart valve: Secondary | ICD-10-CM

## 2020-03-14 ENCOUNTER — Other Ambulatory Visit: Payer: Self-pay | Admitting: Cardiovascular Disease

## 2020-03-17 ENCOUNTER — Other Ambulatory Visit: Payer: Self-pay | Admitting: Cardiovascular Disease

## 2020-04-08 ENCOUNTER — Other Ambulatory Visit: Payer: Self-pay | Admitting: Cardiovascular Disease

## 2020-05-06 ENCOUNTER — Other Ambulatory Visit: Payer: Self-pay | Admitting: Cardiovascular Disease

## 2020-06-11 ENCOUNTER — Other Ambulatory Visit: Payer: Self-pay | Admitting: Cardiovascular Disease

## 2020-07-08 ENCOUNTER — Other Ambulatory Visit: Payer: Self-pay | Admitting: Cardiovascular Disease

## 2020-07-09 ENCOUNTER — Other Ambulatory Visit: Payer: Self-pay | Admitting: Cardiovascular Disease

## 2020-07-14 ENCOUNTER — Other Ambulatory Visit: Payer: Self-pay | Admitting: Cardiovascular Disease

## 2020-07-17 ENCOUNTER — Other Ambulatory Visit: Payer: Self-pay | Admitting: Cardiovascular Disease

## 2020-08-01 ENCOUNTER — Other Ambulatory Visit: Payer: Self-pay | Admitting: Cardiovascular Disease

## 2020-08-08 ENCOUNTER — Other Ambulatory Visit: Payer: Self-pay | Admitting: Cardiovascular Disease

## 2020-08-15 ENCOUNTER — Other Ambulatory Visit: Payer: Self-pay | Admitting: Cardiovascular Disease

## 2020-08-24 ENCOUNTER — Other Ambulatory Visit: Payer: Self-pay | Admitting: Cardiovascular Disease

## 2020-08-29 ENCOUNTER — Other Ambulatory Visit: Payer: Self-pay | Admitting: Cardiovascular Disease

## 2020-09-03 ENCOUNTER — Other Ambulatory Visit: Payer: Self-pay | Admitting: Cardiovascular Disease

## 2020-09-12 ENCOUNTER — Other Ambulatory Visit: Payer: Self-pay | Admitting: Cardiovascular Disease

## 2020-09-19 ENCOUNTER — Ambulatory Visit (INDEPENDENT_AMBULATORY_CARE_PROVIDER_SITE_OTHER): Payer: BC Managed Care – PPO | Admitting: Cardiovascular Disease

## 2020-09-19 ENCOUNTER — Other Ambulatory Visit: Payer: Self-pay

## 2020-09-19 ENCOUNTER — Encounter: Payer: Self-pay | Admitting: Cardiovascular Disease

## 2020-09-19 VITALS — BP 130/82 | HR 60 | Ht 65.0 in | Wt 268.0 lb

## 2020-09-19 DIAGNOSIS — I2510 Atherosclerotic heart disease of native coronary artery without angina pectoris: Secondary | ICD-10-CM | POA: Diagnosis not present

## 2020-09-19 DIAGNOSIS — I10 Essential (primary) hypertension: Secondary | ICD-10-CM

## 2020-09-19 DIAGNOSIS — I351 Nonrheumatic aortic (valve) insufficiency: Secondary | ICD-10-CM

## 2020-09-19 DIAGNOSIS — I480 Paroxysmal atrial fibrillation: Secondary | ICD-10-CM

## 2020-09-19 DIAGNOSIS — E782 Mixed hyperlipidemia: Secondary | ICD-10-CM | POA: Diagnosis not present

## 2020-09-19 DIAGNOSIS — I5022 Chronic systolic (congestive) heart failure: Secondary | ICD-10-CM

## 2020-09-19 LAB — LIPID PANEL
Chol/HDL Ratio: 4.5 ratio (ref 0.0–5.0)
Cholesterol, Total: 154 mg/dL (ref 100–199)
HDL: 34 mg/dL — ABNORMAL LOW (ref >39)
LDL Chol Calc (NIH): 101 mg/dL — ABNORMAL HIGH (ref 0–99)
Triglycerides: 102 mg/dL (ref 0–149)
VLDL Cholesterol Cal: 19 mg/dL (ref 5–40)

## 2020-09-19 LAB — HEPATIC FUNCTION PANEL
ALT: 26 IU/L (ref 0–44)
AST: 35 IU/L (ref 0–40)
Albumin: 4.5 g/dL (ref 3.8–4.8)
Alkaline Phosphatase: 79 IU/L (ref 44–121)
Bilirubin Total: 0.4 mg/dL (ref 0.0–1.2)
Bilirubin, Direct: 0.11 mg/dL (ref 0.00–0.40)
Total Protein: 7.2 g/dL (ref 6.0–8.5)

## 2020-09-19 NOTE — Progress Notes (Signed)
09/19/2020 Donald Escobar   28-Jun-1955  [id number]  Primary Physician Patient, No Pcp Per (Inactive) Primary Cardiologist: [name] MD FACP, [place], [place], [place]  HPI:  Donald Escobar is a 65 y.o.    mildly overweight single Caucasian male with no children who works in the [employer] I last saw him in the office 12/29/2018.[name] He was admitted with congestive heart failure on 10/29/15.[name] He ruled out for myocardial infarction. A 2-D echocardiogram revealed moderately severe LV dysfunction with a globally dilated LV and EF of 25%. He also had moderate to severe aortic insufficiency. He underwent right and left heart cardiac catheterization by myself on 07/15/14 revealing LAD diagonal branch disease, moderate LV dysfunction with moderate to severe aortic insufficiency. 10 days later he underwent coronary artery bypass grafting with a LIMA to his LAD, vein to diagonal branch and a bovine pericardial aortic valve replacement. He was discharged home on amiodarone, Coumadin anticoagulation. He saw [name] registered nurse practitioner in the office on 09/09/14 which time he was in sinus rhythm. He never participated in cardiac rehabilitation.He remains in sinus rhythm today. I last saw him approximately a year and a half ago. Recent 2-D echo performed several days ago revealed an EF of 40-45% with a normal functioning aortic bovine pericardial prosthesis and normal LV size. He denies chest pain or shortness of breath.  Since I saw him in the office 2 year ago he continues to do well.  He denies chest pain or shortness of breath.  His last echo performed 03/07/2020 revealed slight decline in his LV function to 40 to 45% with a mean gradient across his aortic valve 21 mmHg.  There is no AI noted.  He did begin Repatha because of statin intolerance.  He has been otherwise asymptomatic.  Current Meds  Medication Sig  . amiodarone (PACERONE) 100 MG tablet TAKE 1 TABLET BY MOUTH EVERY DAY  . aspirin EC  81 MG EC tablet Take 1 tablet (81 mg total) by mouth daily.  . carvedilol (COREG) 12.5 MG tablet TAKE 1 TABLET BY MOUTH TWICE A DAY WITH MEALS  . ELIQUIS 5 MG TABS tablet TAKE 1 TABLET BY MOUTH TWICE A DAY  . furosemide (LASIX) 40 MG tablet TAKE 1 TABLET BY MOUTH EVERY DAY  . lisinopril (ZESTRIL) 5 MG tablet TAKE 1 TABLET BY MOUTH EVERY DAY  . REPATHA SURECLICK 660 MG/ML SOAJ INJECT 140 MG INTO THE SKIN EVERY 14 (FOURTEEN) DAYS.     Allergies  Allergen Reactions  . Sulfa Antibiotics Swelling    Lip swelling    Social History   Socioeconomic History  . Marital status: Single    Spouse name: Not on file  . Number of children: Not on file  . Years of education: Not on file  . Highest education level: Not on file  Occupational History  . Not on file  Tobacco Use  . Smoking status: Never Smoker  . Smokeless tobacco: Never Used  Substance and Sexual Activity  . Alcohol use: No  . Drug use: No  . Sexual activity: Never  Other Topics Concern  . Not on file  Social History Narrative  . Not on file   Social Determinants of Health   Financial Resource Strain: Not on file  Food Insecurity: Not on file  Transportation Needs: Not on file  Physical Activity: Not on file  Stress: Not on file  Social Connections: Not on file  Intimate Partner Violence: Not on  file     Review of Systems: General: negative for chills, fever, night sweats or weight changes.  Cardiovascular: negative for chest pain, dyspnea on exertion, edema, orthopnea, palpitations, paroxysmal nocturnal dyspnea or shortness of breath Dermatological: negative for rash Respiratory: negative for cough or wheezing Urologic: negative for hematuria Abdominal: negative for nausea, vomiting, diarrhea, bright red blood per rectum, melena, or hematemesis Neurologic: negative for visual changes, syncope, or dizziness All other systems reviewed and are otherwise negative except as noted above.    Blood pressure 130/82,  pulse 60, height 5\' 5"  (1.651 m), weight 268 lb (121.6 kg), SpO2 98 %.  General appearance: alert and no distress Neck: no adenopathy, no carotid bruit, no JVD, supple, symmetrical, trachea midline and thyroid not enlarged, symmetric, no tenderness/mass/nodules Lungs: clear to auscultation bilaterally Heart: regular rate and rhythm, S1, S2 normal, no murmur, click, rub or gallop Extremities: extremities normal, atraumatic, no cyanosis or edema Pulses: 2+ and symmetric Skin: Skin color, texture, turgor normal. No rashes or lesions Neurologic: Alert and oriented X 3, normal strength and tone. Normal symmetric reflexes. Normal coordination and gait  EKG sinus rhythm at 60 with left bundle branch block.  I personally reviewed this EKG.  ASSESSMENT AND PLAN:   Aortic insufficiency History of aortic insufficiency status post bioprosthetic AVR at the time of bypass surgery February 2016.  His most recent 2D echo performed 03/07/2020 revealed a well-functioning aortic bioprosthesis with a mean gradient of 21 mmHg.  There is no regurgitation.  This will be repeated in 12 months.  Hyperlipemia History of hyperlipidemia on Repatha intolerant to statin therapy.  We will recheck a lipid liver profile today  CAD (coronary artery disease) History of CAD status post non-STEMI 07/15/2014 which led to cardiac cath revealing LAD diagonal branch disease.  He subsequent underwent CABG with a LIMA to his LAD and a vein to diagonal branch.  He denies chest pain or shortness of breath.  Hypertension History of essential hypertension a blood pressure measured today 130/82.  He is on carvedilol and lisinopril.  Atrial fibrillation [I48.91] History of PAF maintaining sinus rhythm on Eliquis oral anticoagulation and low-dose amiodarone.  Chronic systolic heart failure (HCC) History of mixed cardiomyopathy with an EF of 40 to 45% by recent 2D echo performed 03/07/2020.  This is most likely related to AI and CAD.  He is  on appropriate medical therapy and is asymptomatic.      [name] MD FACP,FACC,FAHA, [facility] 09/19/2020 8:25 AM

## 2020-09-19 NOTE — Assessment & Plan Note (Signed)
History of aortic insufficiency status post bioprosthetic AVR at the time of bypass surgery February 2016.  His most recent 2D echo performed 03/07/2020 revealed a well-functioning aortic bioprosthesis with a mean gradient of 21 mmHg.  There is no regurgitation.  This will be repeated in 12 months.

## 2020-09-19 NOTE — Assessment & Plan Note (Signed)
History of PAF maintaining sinus rhythm on Eliquis oral anticoagulation and low-dose amiodarone.

## 2020-09-19 NOTE — Assessment & Plan Note (Signed)
History of essential hypertension a blood pressure measured today 130/82.  He is on carvedilol and lisinopril.

## 2020-09-19 NOTE — Assessment & Plan Note (Signed)
History of mixed cardiomyopathy with an EF of 40 to 45% by recent 2D echo performed 03/07/2020.  This is most likely related to AI and CAD.  He is on appropriate medical therapy and is asymptomatic.

## 2020-09-19 NOTE — Patient Instructions (Signed)
Medication Instructions:  Your physician recommends that you continue on your current medications as directed. Please refer to the Current Medication list given to you today.  *If you need a refill on your cardiac medications before your next appointment, please call your pharmacy*   Lab Work: Your physician recommends that you have labs drawn today: lipid/liver profile  If you have labs (blood work) drawn today and your tests are completely normal, you will receive your results only by: [name] MyChart Message (if you have MyChart) OR . A paper copy in the mail If you have any lab test that is abnormal or we need to change your treatment, we will call you to review the results.   Testing/Procedures: Your physician has requested that you have an echocardiogram. Echocardiography is a painless test that uses sound waves to create images of your heart. It provides your doctor with information about the size and shape of your heart and how well your heart's chambers and valves are working. This procedure takes approximately one hour. There are no restrictions for this procedure. This procedure is done at [address]rd Floor. To be done Sept 2022.   Follow-Up: At [facility], you and your health needs are our priority.  As part of our continuing mission to provide you with exceptional heart care, we have created designated Provider Care Teams.  These Care Teams include your primary Cardiologist (physician) and Advanced Practice Providers (APPs -  Physician Assistants and Nurse Practitioners) who all work together to provide you with the care you need, when you need it.  We recommend signing up for the patient portal called "MyChart".  Sign up information is provided on this After Visit Summary.  MyChart is used to connect with patients for Virtual Visits (Telemedicine).  Patients are able to view lab/test results, encounter notes, upcoming appointments, etc.  Non-urgent messages can be sent to  your provider as well.   To learn more about what you can do with MyChart, go to [email or web address].    Your next appointment:   12 month(s)  The format for your next appointment:   In Person  Provider:   Quay Burow, MD

## 2020-09-19 NOTE — Assessment & Plan Note (Signed)
History of CAD status post non-STEMI 07/15/2014 which led to cardiac cath revealing LAD diagonal branch disease.  He subsequent underwent CABG with a LIMA to his LAD and a vein to diagonal branch.  He denies chest pain or shortness of breath.

## 2020-09-19 NOTE — Assessment & Plan Note (Signed)
History of hyperlipidemia on Repatha intolerant to statin therapy.  We will recheck a lipid liver profile today

## 2020-09-24 ENCOUNTER — Other Ambulatory Visit: Payer: Self-pay

## 2020-09-24 DIAGNOSIS — E782 Mixed hyperlipidemia: Secondary | ICD-10-CM

## 2020-09-30 ENCOUNTER — Other Ambulatory Visit: Payer: Self-pay | Admitting: Cardiovascular Disease

## 2020-10-06 ENCOUNTER — Other Ambulatory Visit: Payer: Self-pay | Admitting: Cardiovascular Disease

## 2020-10-31 ENCOUNTER — Other Ambulatory Visit: Payer: Self-pay | Admitting: Cardiovascular Disease

## 2020-11-04 ENCOUNTER — Other Ambulatory Visit: Payer: Self-pay | Admitting: Cardiovascular Disease

## 2021-01-20 ENCOUNTER — Encounter (HOSPITAL_COMMUNITY): Payer: Self-pay | Admitting: Cardiovascular Disease

## 2021-02-09 ENCOUNTER — Telehealth: Payer: Self-pay | Admitting: Cardiovascular Disease

## 2021-02-09 ENCOUNTER — Telehealth (HOSPITAL_COMMUNITY): Payer: Self-pay | Admitting: Cardiovascular Disease

## 2021-02-09 NOTE — Telephone Encounter (Signed)
Per Donald Escobar with Donald Escobar, patient passed away on February 26, 2021 in his house. She would like to know if Dr. Gwenlyn Found can sign his death certificate. Please advise.

## 2021-02-09 NOTE — Telephone Encounter (Signed)
Just an FYI. We have made several attempts to contact this patient including sending a letter to schedule or reschedule their echocardiogram. We will be removing the patient from the echo WQ.   MAILED LETTER [facility]  01/20/21 [facility] to schedule @ 12:39/[facility]  01/15/21 [facility] to schedule @ 10:28/LBW  01/12/21 [facility] to schedule @ 12:34/LBW    Thank you

## 2021-02-10 NOTE — Telephone Encounter (Signed)
Fraser Din is following up. Please advise when able.

## 2021-02-11 ENCOUNTER — Telehealth: Payer: Self-pay | Admitting: Cardiovascular Disease

## 2021-02-11 NOTE — Telephone Encounter (Signed)
[facility] called back and said that Dr. Gwenlyn Found forgot to sign death certificate. Please call back

## 2021-02-11 NOTE — Telephone Encounter (Signed)
  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

## 2021-02-11 NOTE — Telephone Encounter (Signed)
Follow up:     Donald Escobar is calling to check the status of death certificate.

## 2021-02-11 NOTE — Telephone Encounter (Signed)
Returned call to [employer] with Donald Escobar left message to call back.

## 2021-02-12 NOTE — Telephone Encounter (Signed)
See 8/31 telephone note

## 2021-02-12 NOTE — Telephone Encounter (Signed)
Spoke with Donald Escobar, she reports dr berry did not sign the death certificate. He just needs to go back into it and sign it. Aware will let dr berry know.

## 2021-02-12 NOTE — Telephone Encounter (Signed)
   Fraser Din is returning call, she also gave her cell# [phone]

## 2021-02-12 DEATH — deceased

## 2021-02-17 NOTE — Telephone Encounter (Signed)
Fun[facility] home call back to say that Dr berry didn't sign the death certificate and they need that to be done. Also dr berry listed coronary artery disease 4 time and it only need to be listed once. If he needs help with filling document out they said he can contact [employer] depart ask for  Magda Paganini at [phone] Please advise

## 2021-02-24 NOTE — Telephone Encounter (Signed)
Pat from [employer] calling back. She states the deceased date was changed from August 24th to August 1st and it needs to be changed back. She states it is very urgent now, because it has been going on for several days now and they need to proceed with cremation. Phone: [phone]

## 2021-02-26 NOTE — Telephone Encounter (Signed)
Pat from [facility] was calling back regarding this patient's death certificate

## 2021-03-06 ENCOUNTER — Other Ambulatory Visit (HOSPITAL_COMMUNITY): Payer: BC Managed Care – PPO

## 2021-03-10 ENCOUNTER — Ambulatory Visit (HOSPITAL_BASED_OUTPATIENT_CLINIC_OR_DEPARTMENT_OTHER): Payer: BC Managed Care – PPO | Admitting: Internal Medicine
